# Patient Record
Sex: Male | Born: 1950 | Race: White | Hispanic: No | Marital: Married | Smoking: Never smoker
Health system: Southern US, Community
[De-identification: ages and names within clinical notes are randomized; demographics above are authoritative.]

## PROBLEM LIST (undated history)

## (undated) DIAGNOSIS — R112 Nausea with vomiting, unspecified: Secondary | ICD-10-CM

## (undated) DIAGNOSIS — Z9889 Other specified postprocedural states: Secondary | ICD-10-CM

## (undated) DIAGNOSIS — M1990 Unspecified osteoarthritis, unspecified site: Secondary | ICD-10-CM

## (undated) DIAGNOSIS — L309 Dermatitis, unspecified: Secondary | ICD-10-CM

## (undated) DIAGNOSIS — D3A090 Benign carcinoid tumor of the bronchus and lung: Secondary | ICD-10-CM

## (undated) DIAGNOSIS — Z8489 Family history of other specified conditions: Secondary | ICD-10-CM

## (undated) DIAGNOSIS — G4730 Sleep apnea, unspecified: Secondary | ICD-10-CM

## (undated) DIAGNOSIS — E785 Hyperlipidemia, unspecified: Secondary | ICD-10-CM

## (undated) DIAGNOSIS — K219 Gastro-esophageal reflux disease without esophagitis: Secondary | ICD-10-CM

## (undated) HISTORY — PX: COLONOSCOPY: SHX174

## (undated) HISTORY — PX: EYE SURGERY: SHX253

## (undated) HISTORY — PX: KNEE ARTHROSCOPY: SHX127

## (undated) HISTORY — DX: Sleep apnea, unspecified: G47.30

## (undated) HISTORY — DX: Gastro-esophageal reflux disease without esophagitis: K21.9

## (undated) HISTORY — DX: Dermatitis, unspecified: L30.9

---
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
## 1978-06-25 HISTORY — PX: SEPTOPLASTY: SUR1290

## 2005-11-01 ENCOUNTER — Emergency Department (HOSPITAL_COMMUNITY): Admission: EM | Admit: 2005-11-01 | Discharge: 2005-11-01 | Payer: Self-pay | Admitting: Emergency Medicine

## 2006-08-26 ENCOUNTER — Ambulatory Visit: Payer: Self-pay | Admitting: Internal Medicine

## 2006-09-09 ENCOUNTER — Encounter (INDEPENDENT_AMBULATORY_CARE_PROVIDER_SITE_OTHER): Payer: Self-pay | Admitting: *Deleted

## 2006-09-09 ENCOUNTER — Ambulatory Visit: Payer: Self-pay | Admitting: Internal Medicine

## 2006-09-09 ENCOUNTER — Encounter (INDEPENDENT_AMBULATORY_CARE_PROVIDER_SITE_OTHER): Payer: Self-pay | Admitting: Specialist

## 2008-05-24 DIAGNOSIS — D126 Benign neoplasm of colon, unspecified: Secondary | ICD-10-CM

## 2008-05-24 DIAGNOSIS — K5730 Diverticulosis of large intestine without perforation or abscess without bleeding: Secondary | ICD-10-CM | POA: Insufficient documentation

## 2009-12-20 ENCOUNTER — Emergency Department (HOSPITAL_COMMUNITY): Admission: EM | Admit: 2009-12-20 | Discharge: 2009-12-20 | Payer: Self-pay | Admitting: Emergency Medicine

## 2010-08-08 DIAGNOSIS — L309 Dermatitis, unspecified: Secondary | ICD-10-CM

## 2010-08-08 HISTORY — DX: Dermatitis, unspecified: L30.9

## 2011-07-25 ENCOUNTER — Encounter: Payer: Self-pay | Admitting: Internal Medicine

## 2011-07-27 ENCOUNTER — Encounter: Payer: Self-pay | Admitting: Internal Medicine

## 2011-08-21 ENCOUNTER — Ambulatory Visit (AMBULATORY_SURGERY_CENTER): Payer: 59 | Admitting: *Deleted

## 2011-08-21 VITALS — Ht 71.0 in | Wt 197.1 lb

## 2011-08-21 DIAGNOSIS — Z1211 Encounter for screening for malignant neoplasm of colon: Secondary | ICD-10-CM

## 2011-08-21 MED ORDER — PEG-KCL-NACL-NASULF-NA ASC-C 100 G PO SOLR: ORAL | Status: DC

## 2011-08-22 ENCOUNTER — Encounter: Payer: Self-pay | Admitting: Internal Medicine

## 2011-09-04 ENCOUNTER — Encounter: Payer: Self-pay | Admitting: Internal Medicine

## 2011-09-04 ENCOUNTER — Ambulatory Visit (AMBULATORY_SURGERY_CENTER): Payer: 59 | Admitting: Internal Medicine

## 2011-09-04 VITALS — BP 130/70 | HR 59 | Temp 96.5°F | Resp 18 | Ht 71.0 in | Wt 197.0 lb

## 2011-09-04 DIAGNOSIS — D126 Benign neoplasm of colon, unspecified: Secondary | ICD-10-CM

## 2011-09-04 DIAGNOSIS — Z1211 Encounter for screening for malignant neoplasm of colon: Secondary | ICD-10-CM

## 2011-09-04 DIAGNOSIS — K5730 Diverticulosis of large intestine without perforation or abscess without bleeding: Secondary | ICD-10-CM

## 2011-09-04 DIAGNOSIS — Z86010 Personal history of colonic polyps: Secondary | ICD-10-CM

## 2011-09-04 MED ORDER — SODIUM CHLORIDE 0.9 % IV SOLN: 500.0000 mL | INTRAVENOUS | Status: DC

## 2011-09-04 NOTE — Patient Instructions (Signed)
YOU HAD AN ENDOSCOPIC PROCEDURE TODAY AT THE [facility]: Refer to the procedure report that was given to you for any specific questions about what was found during the examination.  If the procedure report does not answer your questions, please call your gastroenterologist to clarify.  If you requested that your care partner not be given the details of your procedure findings, then the procedure report has been included in a sealed envelope for you to review at your convenience later.  YOU SHOULD EXPECT: Some feelings of bloating in the abdomen. Passage of more gas than usual.  Walking can help get rid of the air that was put into your GI tract during the procedure and reduce the bloating. If you had a lower endoscopy (such as a colonoscopy or flexible sigmoidoscopy) you may notice spotting of blood in your stool or on the toilet paper. If you underwent a bowel prep for your procedure, then you may not have a normal bowel movement for a few days.  DIET: Your first meal following the procedure should be a light meal and then it is ok to progress to your normal diet.  A half-sandwich or bowl of soup is an example of a good first meal.  Heavy or fried foods are harder to digest and may make you feel nauseous or bloated.  Likewise meals heavy in dairy and vegetables can cause extra gas to form and this can also increase the bloating.  Drink plenty of fluids but you should avoid alcoholic beverages for 24 hours.  ACTIVITY: Your care partner should take you home directly after the procedure.  You should plan to take it easy, moving slowly for the rest of the day.  You can resume normal activity the day after the procedure however you should NOT DRIVE or use heavy machinery for 24 hours (because of the sedation medicines used during the test).    SYMPTOMS TO REPORT IMMEDIATELY: A gastroenterologist can be reached at any hour.  During normal business hours, 8:30 AM to 5:00 PM Monday through Friday,  call [phone].  After hours and on weekends, please call the GI answering service at [phone] who will take a message and have the physician on call contact you.   Following lower endoscopy (colonoscopy or flexible sigmoidoscopy):  Excessive amounts of blood in the stool  Significant tenderness or worsening of abdominal pains  Swelling of the abdomen that is new, acute  Fever of 100F or higher    FOLLOW UP: If any biopsies were taken you will be contacted by phone or by letter within the next 1-3 weeks.  Call your gastroenterologist if you have not heard about the biopsies in 3 weeks.  Our staff will call the home number listed on your records the next business day following your procedure to check on you and address any questions or concerns that you may have at that time regarding the information given to you following your procedure. This is a courtesy call and so if there is no answer at the home number and we have not heard from you through the emergency physician on call, we will assume that you have returned to your regular daily activities without incident.  SIGNATURES/CONFIDENTIALITY: You and/or your care partner have signed paperwork which will be entered into your electronic medical record.  These signatures attest to the fact that that the information above on your After Visit Summary has been reviewed and is understood.  Full responsibility of the confidentiality   of this discharge information lies with you and/or your care-partner.     

## 2011-09-04 NOTE — Progress Notes (Signed)
Patient did not experience any of the following events: a burn prior to discharge; a fall within the facility; wrong site/side/patient/procedure/implant event; or a hospital transfer or hospital admission upon discharge from the facility. (G8907) Patient did not have preoperative order for IV antibiotic SSI prophylaxis. (G8918)  

## 2011-09-04 NOTE — Op Note (Signed)
[facility] [address] [place], [place]  [address]  COLONOSCOPY PROCEDURE REPORT  PATIENT:  [name], [name]  MR#:  [id number] BIRTHDATE:  1950-08-29, 61 yrs. old  GENDER:  male ENDOSCOPIST:  Wilhemina Bonito. Eda Keys, MD REF. BY:  Surveillance Program Recall, PROCEDURE DATE:  09/04/2011 PROCEDURE:  Colonoscopy with snare polypectomy x 2 ASA CLASS:  Class II INDICATIONS:  history of pre-cancerous (adenomatous) colon polyps, surveillance and high-risk screening ; index exam 08-2006 w/ TA MEDICATIONS:   MAC sedation, administered by CRNA, propofol (Diprivan) 350 mg IV  DESCRIPTION OF PROCEDURE:   After the risks benefits and alternatives of the procedure were thoroughly explained, informed consent was obtained.  Digital rectal exam was performed and revealed no abnormalities.   The LB 180AL [id number] endoscope was introduced through the anus and advanced to the cecum, which was identified by both the appendix and ileocecal valve, without limitations.  The quality of the prep was excellent, using MoviPrep.  The instrument was then slowly withdrawn as the colon was fully examined. <<PROCEDUREIMAGES>>  FINDINGS:  Two diminutie polyps were found in the descending colon and rectum. Polyps were snared without cautery. Retrieval was successful.  Mild diverticulosis was found in the sigmoid colon. Otherwise normal colonoscopy without other polyps, masses, vascular ectasias, or inflammatory changes.   Retroflexed views in the rectum revealed no abnormalities.    The time to cecum = 3:16 minutes. The scope was then withdrawn in 10:44  minutes from the cecum and the procedure completed.  COMPLICATIONS:  None  ENDOSCOPIC IMPRESSION: 1) Two polyps in the colon - removed 2) Mild diverticulosis in the sigmoid colon 3) Otherwise normal colonoscopy  RECOMMENDATIONS: 1) Follow up colonoscopy in 5 years  ______________________________ Wilhemina Bonito. Eda Keys, MD  CC:  Rodrigo Ran, MD;  The  Patient  n. eSIGNED:   Wilhemina Bonito. Eda Keys at 09/04/2011 08:41 AM  Clydie Braun, [id number]

## 2011-09-05 ENCOUNTER — Telehealth: Payer: Self-pay | Admitting: *Deleted

## 2011-09-05 NOTE — Telephone Encounter (Signed)
  Follow up Call-  Call back number 09/04/2011  Post procedure Call Back phone  # [phone]  Permission to leave phone message Yes     Patient questions:  Do you have a fever, pain , or abdominal swelling? no Pain Score  0 *  Have you tolerated food without any problems? yes  Have you been able to return to your normal activities? yes  Do you have any questions about your discharge instructions: Diet   no Medications  no Follow up visit  no  Do you have questions or concerns about your Care? no  Actions: * If pain score is 4 or above: No action needed, pain <4.

## 2011-09-10 ENCOUNTER — Encounter: Payer: Self-pay | Admitting: Internal Medicine

## 2012-04-24 ENCOUNTER — Encounter: Payer: Self-pay | Admitting: Internal Medicine

## 2014-11-04 ENCOUNTER — Encounter: Payer: Self-pay | Admitting: Internal Medicine

## 2015-10-14 ENCOUNTER — Other Ambulatory Visit: Payer: Self-pay | Admitting: Physician Assistant

## 2016-07-30 ENCOUNTER — Encounter: Payer: Self-pay | Admitting: Internal Medicine

## 2016-08-08 ENCOUNTER — Encounter: Payer: Self-pay | Admitting: Internal Medicine

## 2016-10-18 ENCOUNTER — Encounter: Payer: Self-pay | Admitting: Internal Medicine

## 2016-10-23 ENCOUNTER — Ambulatory Visit (AMBULATORY_SURGERY_CENTER): Payer: Self-pay

## 2016-10-23 VITALS — Ht 70.0 in | Wt 201.2 lb

## 2016-10-23 DIAGNOSIS — Z860100 Personal history of colon polyps, unspecified: Secondary | ICD-10-CM

## 2016-10-23 MED ORDER — SUPREP BOWEL PREP KIT 17.5-3.13-1.6 GM/177ML PO SOLN: 1.0000 | Freq: Once | ORAL | 0 refills | Status: AC

## 2016-10-23 NOTE — Progress Notes (Signed)
No allergies to eggs or soy No past problems with anesthesia No diet meds No home oxygen  Registered emmi 

## 2016-10-29 ENCOUNTER — Telehealth (INDEPENDENT_AMBULATORY_CARE_PROVIDER_SITE_OTHER): Payer: Self-pay | Admitting: Orthopaedic Surgery

## 2016-10-29 NOTE — Telephone Encounter (Signed)
Patient called asked if he can have the gel ordered for an injection in his knee. The number to contact patient is [phone]

## 2016-10-31 NOTE — Telephone Encounter (Signed)
I'm fine with having Synvisc ordered again since it has been well over 6 months since his last injection.

## 2016-11-01 ENCOUNTER — Telehealth (INDEPENDENT_AMBULATORY_CARE_PROVIDER_SITE_OTHER): Payer: Self-pay

## 2016-11-01 NOTE — Telephone Encounter (Signed)
Can we schedule patient with Ninfa Linden or Artis Delay for a Synvisc injection. Next available, no rush, thank you!

## 2016-11-01 NOTE — Telephone Encounter (Signed)
Patient is scheduled for injection on 11/05/16 @ 9:30 am with Artis Delay

## 2016-11-01 NOTE — Telephone Encounter (Signed)
Already done by [facility]. Patient has appointment on Monday with Artis Delay.

## 2016-11-05 ENCOUNTER — Encounter (INDEPENDENT_AMBULATORY_CARE_PROVIDER_SITE_OTHER): Payer: Self-pay

## 2016-11-05 ENCOUNTER — Encounter (INDEPENDENT_AMBULATORY_CARE_PROVIDER_SITE_OTHER): Payer: Self-pay | Admitting: Physician Assistant

## 2016-11-05 ENCOUNTER — Ambulatory Visit (INDEPENDENT_AMBULATORY_CARE_PROVIDER_SITE_OTHER): Payer: 59 | Admitting: Physician Assistant

## 2016-11-05 DIAGNOSIS — M1711 Unilateral primary osteoarthritis, right knee: Secondary | ICD-10-CM | POA: Diagnosis not present

## 2016-11-05 MED ORDER — HYLAN G-F 20 48 MG/6ML IX SOSY
48.0000 mg | PREFILLED_SYRINGE | INTRA_ARTICULAR | Status: AC | PRN
  Administered 2016-11-05: 48 mg via INTRA_ARTICULAR

## 2016-11-05 MED ORDER — LIDOCAINE HCL 1 % IJ SOLN
3.0000 mL | INTRAMUSCULAR | Status: AC | PRN
  Administered 2016-11-05: 3 mL

## 2016-11-05 NOTE — Progress Notes (Signed)
   Procedure Note  Patient: [name]             Date of Birth: [birth year]           MRN: [id number]             Visit Date: 11/05/2016   Examination: Right knee no effusion abnormal warmth erythema full extension and flexion.  Procedures: Visit Diagnoses: Primary osteoarthritis of right knee  Large Joint Inj Date/Time: 11/05/2016 10:30 AM Performed by: Pete Pelt Authorized by: Pete Pelt   Location:  Knee Site:  R knee Needle Size:  18 G Needle Length:  1.5 inches and 3.5 inches Approach:  Anterolateral Ultrasound Guidance: No   Fluoroscopic Guidance: No   Arthrogram: No   Medications:  3 mL lidocaine 1 %; 48 mg Hylan 48 MG/6ML Aspiration Attempted: No   Patient tolerance:  Patient tolerated the procedure well with no immediate complications   Plan: Follow-up on an as-needed basis. He understands he can have supplemental injections every 6 months as needed.

## 2016-11-06 ENCOUNTER — Encounter: Payer: Self-pay | Admitting: Internal Medicine

## 2016-11-06 ENCOUNTER — Ambulatory Visit (AMBULATORY_SURGERY_CENTER): Payer: 59 | Admitting: Internal Medicine

## 2016-11-06 VITALS — BP 116/72 | HR 66 | Temp 97.5°F | Resp 16 | Ht 70.0 in | Wt 201.0 lb

## 2016-11-06 DIAGNOSIS — Z86010 Personal history of colonic polyps: Secondary | ICD-10-CM

## 2016-11-06 DIAGNOSIS — D120 Benign neoplasm of cecum: Secondary | ICD-10-CM | POA: Diagnosis not present

## 2016-11-06 DIAGNOSIS — D124 Benign neoplasm of descending colon: Secondary | ICD-10-CM

## 2016-11-06 MED ORDER — SODIUM CHLORIDE 0.9 % IV SOLN: 500.0000 mL | INTRAVENOUS | Status: DC

## 2016-11-06 NOTE — Op Note (Signed)
[facility] Patient Name: [name] Procedure Date: 11/06/2016 8:08 AM MRN: [id number] Endoscopist: Docia Chuck. Henrene Pastor , MD Age: 66 Referring MD:  Date of Birth: 07/08/1950 Gender: Male Account #: [id number] Procedure:                Colonoscopy with cold snare polypectomy x 2 Indications:              High risk colon cancer surveillance: Personal                            history of multiple (3 or more) adenomas. Prior                            exams 2008, 2013 Medicines:                Monitored Anesthesia Care Procedure:                Pre-Anesthesia Assessment:                           - Prior to the procedure, a History and Physical                            was performed, and patient medications and                            allergies were reviewed. The patient's tolerance of                            previous anesthesia was also reviewed. The risks                            and benefits of the procedure and the sedation                            options and risks were discussed with the patient.                            All questions were answered, and informed consent                            was obtained. Prior Anticoagulants: The patient has                            taken no previous anticoagulant or antiplatelet                            agents. ASA Grade Assessment: II - A patient with                            mild systemic disease. After reviewing the risks                            and benefits, the patient was deemed in  satisfactory condition to undergo the procedure.                           After obtaining informed consent, the colonoscope                            was passed under direct vision. Throughout the                            procedure, the patient's blood pressure, pulse, and                            oxygen saturations were monitored continuously. The                            Colonoscope was  introduced through the anus and                            advanced to the the cecum, identified by                            appendiceal orifice and ileocecal valve. The                            ileocecal valve, appendiceal orifice, and rectum                            were photographed. The quality of the bowel                            preparation was excellent. The colonoscopy was                            performed without difficulty. The patient tolerated                            the procedure well. The bowel preparation used was                            SUPREP. Scope In: 8:12:50 AM Scope Out: 8:25:37 AM Scope Withdrawal Time: 0 hours 10 minutes 16 seconds  Total Procedure Duration: 0 hours 12 minutes 47 seconds  Findings:                 Two polyps were found in the descending colon and                            cecum. The polyps were 2 to 3 mm in size. These                            polyps were removed with a cold snare. Resection                            and retrieval were complete.  Multiple diverticula were found in the left colon.                           Internal hemorrhoids were found during                            retroflexion. The hemorrhoids were small.                           The exam was otherwise without abnormality on                            direct and retroflexion views. Complications:            No immediate complications. Estimated blood loss:                            None. Estimated Blood Loss:     Estimated blood loss: none. Impression:               - Two 2 to 3 mm polyps in the descending colon and                            in the cecum, removed with a cold snare. Resected                            and retrieved.                           - Diverticulosis in the left colon.                           - Internal hemorrhoids.                           - The examination was otherwise normal on direct                             and retroflexion views. Recommendation:           - Repeat colonoscopy in 5 years for surveillance.                           - Patient has a contact number available for                            emergencies. The signs and symptoms of potential                            delayed complications were discussed with the                            patient. Return to normal activities tomorrow.                            Written discharge instructions were provided to the  patient.                           - Resume previous diet.                           - Continue present medications.                           - Await pathology results. Docia Chuck. Henrene Pastor, MD 11/06/2016 8:31:14 AM This report has been signed electronically.

## 2016-11-06 NOTE — Progress Notes (Signed)
To recovery, report to Hodges, RN, VSS 

## 2016-11-06 NOTE — Progress Notes (Signed)
Called to room to assist during endoscopic procedure.  Patient ID and intended procedure confirmed with present staff. Received instructions for my participation in the procedure from the performing physician.  

## 2016-11-06 NOTE — Patient Instructions (Signed)
YOU HAD AN ENDOSCOPIC PROCEDURE TODAY AT THE [facility]:   Refer to the procedure report that was given to you for any specific questions about what was found during the examination.  If the procedure report does not answer your questions, please call your gastroenterologist to clarify.  If you requested that your care partner not be given the details of your procedure findings, then the procedure report has been included in a sealed envelope for you to review at your convenience later.  YOU SHOULD EXPECT: Some feelings of bloating in the abdomen. Passage of more gas than usual.  Walking can help get rid of the air that was put into your GI tract during the procedure and reduce the bloating. If you had a lower endoscopy (such as a colonoscopy or flexible sigmoidoscopy) you may notice spotting of blood in your stool or on the toilet paper. If you underwent a bowel prep for your procedure, you may not have a normal bowel movement for a few days.  Please Note:  You might notice some irritation and congestion in your nose or some drainage.  This is from the oxygen used during your procedure.  There is no need for concern and it should clear up in a day or so.  SYMPTOMS TO REPORT IMMEDIATELY:   Following lower endoscopy (colonoscopy or flexible sigmoidoscopy):  Excessive amounts of blood in the stool  Significant tenderness or worsening of abdominal pains  Swelling of the abdomen that is new, acute  Fever of 100F or higher   For urgent or emergent issues, a gastroenterologist can be reached at any hour by calling [phone].   DIET:  We do recommend a small meal at first, but then you may proceed to your regular diet.  Drink plenty of fluids but you should avoid alcoholic beverages for 24 hours. Try to increase the fiber in your diet, and drink plenty of water.  ACTIVITY:  You should plan to take it easy for the rest of today and you should NOT DRIVE or use heavy machinery until  tomorrow (because of the sedation medicines used during the test).    FOLLOW UP: Our staff will call the number listed on your records the next business day following your procedure to check on you and address any questions or concerns that you may have regarding the information given to you following your procedure. If we do not reach you, we will leave a message.  However, if you are feeling well and you are not experiencing any problems, there is no need to return our call.  We will assume that you have returned to your regular daily activities without incident.  If any biopsies were taken you will be contacted by phone or by letter within the next 1-3 weeks.  Please call us at [phone] if you have not heard about the biopsies in 3 weeks.    SIGNATURES/CONFIDENTIALITY: You and/or your care partner have signed paperwork which will be entered into your electronic medical record.  These signatures attest to the fact that that the information above on your After Visit Summary has been reviewed and is understood.  Full responsibility of the confidentiality of this discharge information lies with you and/or your care-partner.  Read all of the handouts given to you by your recovery room nurse. 

## 2016-11-07 ENCOUNTER — Telehealth: Payer: Self-pay | Admitting: *Deleted

## 2016-11-07 NOTE — Telephone Encounter (Signed)
  Follow up Call-  Call back number 11/06/2016  Post procedure Call Back phone  # [phone]  Permission to leave phone message Yes  Some recent data might be hidden     Patient questions:  Do you have a fever, pain , or abdominal swelling? No. Pain Score  0 *  Have you tolerated food without any problems? Yes.    Have you been able to return to your normal activities? Yes.    Do you have any questions about your discharge instructions: Diet   No. Medications  No. Follow up visit  No.  Do you have questions or concerns about your Care? No.  Actions: * If pain score is 4 or above: No action needed, pain <4.

## 2016-11-13 ENCOUNTER — Encounter: Payer: Self-pay | Admitting: Internal Medicine

## 2017-12-03 ENCOUNTER — Telehealth (INDEPENDENT_AMBULATORY_CARE_PROVIDER_SITE_OTHER): Payer: Self-pay | Admitting: Orthopaedic Surgery

## 2017-12-03 NOTE — Telephone Encounter (Signed)
Please advise 

## 2017-12-03 NOTE — Telephone Encounter (Signed)
Needs follow up for knee has not been seen since 2018

## 2017-12-03 NOTE — Telephone Encounter (Signed)
Patient called asked if he can be set up for an MRI on his right knee. Patient said the knee is beginning to hurt worse. The number to contact patient is [phone]

## 2017-12-03 NOTE — Telephone Encounter (Signed)
Needs appt before something like this can be ordered please

## 2017-12-04 NOTE — Telephone Encounter (Signed)
Patient scheduled 12/16/17 at 9:15am with Artis Delay

## 2017-12-16 ENCOUNTER — Ambulatory Visit (INDEPENDENT_AMBULATORY_CARE_PROVIDER_SITE_OTHER): Payer: Medicare Other | Admitting: Physician Assistant

## 2017-12-16 ENCOUNTER — Encounter (INDEPENDENT_AMBULATORY_CARE_PROVIDER_SITE_OTHER): Payer: Self-pay | Admitting: Physician Assistant

## 2017-12-16 ENCOUNTER — Ambulatory Visit (INDEPENDENT_AMBULATORY_CARE_PROVIDER_SITE_OTHER): Payer: Medicare Other

## 2017-12-16 VITALS — Ht 70.0 in | Wt 201.0 lb

## 2017-12-16 DIAGNOSIS — M25561 Pain in right knee: Secondary | ICD-10-CM | POA: Diagnosis not present

## 2017-12-16 DIAGNOSIS — G8929 Other chronic pain: Secondary | ICD-10-CM

## 2017-12-16 MED ORDER — DIAZEPAM 5 MG PO TABS: ORAL_TABLET | ORAL | 0 refills | Status: DC

## 2017-12-16 NOTE — Progress Notes (Signed)
Office Visit Note   Patient: [name]           Date of Birth: [birth year]           MRN: [id number] Visit Date: 12/16/2017              Requested by: [name], MD [address] [place],  [address] PCP: [name], MD   Assessment & Plan: Visit Diagnoses:  1. Chronic pain of right knee     Plan: Due to the fact the patient had similar findings back in 2014 and had loose bodies that did not show up on regular radiographs subsequently underwent a right knee arthroscopy for removal of loose bodies and meniscal tear recommend MRI to rule out loose bodies.  Having back after the MRI to go over results and discuss further treatment.  He is claustrophobic therefore we did send its value that he will take prior to the study.  Follow-Up Instructions: Return for After MRI.   Orders:  Orders Placed This Encounter  Procedures  . XR Knee 1-2 Views Right  . MR Knee Right w/o contrast   Meds ordered this encounter  Medications  . diazepam (VALIUM) 5 MG tablet    Sig: Take 1 tab 30 mins prior to procedure repeat as needed.    Dispense:  2 tablet    Refill:  0      Procedures: No procedures performed   Clinical Data: No additional findings.   Subjective: Chief Complaint  Patient presents with  . Right Knee - Follow-up    S/p cortisone  injection 11/05/16    HPI Mr. Baldyga comes in today with right knee pain that is becoming worse over the last few months.  He states that he went back to the gym and was doing some running in the gym worsening pain since then.  He notes that knee occasionally gets walking he has to actively push the need to get it unlocked.  He has had no giving way catching or painful popping in the knee.  Notes no swelling.  He is tried no medications or treatment for this.  Had similar symptoms back in 2014 underwent knee arthroscopy for loose bodies but did not show up on plain films and a meniscal tear.  His symptoms are very similar to what  he had in the past. Review of Systems Please see HPI otherwise negative  Objective: Vital Signs: Ht 5\' 10"  (1.778 m)   Wt 201 lb (91.2 kg)   BMI 28.84 kg/m   Physical Exam  Constitutional: He is oriented to person, place, and time. He appears well-developed and well-nourished. No distress.  Pulmonary/Chest: Effort normal.  Neurological: He is alert and oriented to person, place, and time.  Skin: He is not diaphoretic.  Psychiatric: He has a normal mood and affect.    Ortho Exam Bilateral knees he has good range of motion without pain.  He has tenderness over the lateral joint line of the right knee.  Positive McMurray's right knee.  No abnormal warmth erythema or effusion positive edema of the right knee compared to left though.  Calves are supple nontender bilaterally.  No instability valgus varus stressing anterior drawer is negative bilaterally. Specialty Comments:  No specialty comments available.  Imaging: Xr Knee 1-2 Views Right  Result Date: 12/16/2017 Right knee AP lateral views: Moderate narrowing lateral joint line.  Mild to moderate patellofemoral changes.  No acute fractures bony abnormalities or loose bodies.  Knee is  well located.    PMFS History: Patient Active Problem List   Diagnosis Date Noted  . Primary osteoarthritis of right knee 11/05/2016  . COLONIC POLYPS, ADENOMATOUS 05/24/2008  . DIVERTICULOSIS, COLON 05/24/2008   Past Medical History:  Diagnosis Date  . GERD (gastroesophageal reflux disease)    weight loss resolved gerd  . Sleep apnea    cpap    Family History  Problem Relation Age of Onset  . Colon cancer Neg Hx   . Stomach cancer Neg Hx     Past Surgical History:  Procedure Laterality Date  . SEPTOPLASTY  1980   x 2  . TONSILLECTOMY AND ADENOIDECTOMY  1960   Social History   Occupational History  . Not on file  Tobacco Use  . Smoking status: Never Smoker  . Smokeless tobacco: Never Used  Substance and Sexual Activity  .  Alcohol use: Yes    Alcohol/week: 4.2 oz    Types: 7 Glasses of wine per week  . Drug use: No  . Sexual activity: Not on file

## 2017-12-25 ENCOUNTER — Ambulatory Visit
Admission: RE | Admit: 2017-12-25 | Discharge: 2017-12-25 | Disposition: A | Discharge status: Home / Self Care | Payer: Medicare Other | Source: Ambulatory Visit | Attending: Physician Assistant | Admitting: Physician Assistant

## 2017-12-25 DIAGNOSIS — G8929 Other chronic pain: Secondary | ICD-10-CM

## 2017-12-25 DIAGNOSIS — M25561 Pain in right knee: Principal | ICD-10-CM

## 2017-12-30 ENCOUNTER — Ambulatory Visit (INDEPENDENT_AMBULATORY_CARE_PROVIDER_SITE_OTHER): Payer: Medicare Other | Admitting: Physician Assistant

## 2017-12-30 ENCOUNTER — Encounter (INDEPENDENT_AMBULATORY_CARE_PROVIDER_SITE_OTHER): Payer: Self-pay | Admitting: Physician Assistant

## 2017-12-30 ENCOUNTER — Telehealth (INDEPENDENT_AMBULATORY_CARE_PROVIDER_SITE_OTHER): Payer: Self-pay

## 2017-12-30 DIAGNOSIS — M1711 Unilateral primary osteoarthritis, right knee: Secondary | ICD-10-CM | POA: Diagnosis not present

## 2017-12-30 NOTE — Telephone Encounter (Signed)
Monovisc right knee

## 2017-12-30 NOTE — Progress Notes (Signed)
HPI: [name] returns today to go over the MRI of his right knee.  He has had no real change pain in his right knee since the last office visit.  Continues to have pain mostly the lateral aspect of the knee.  Cut back on his exercise and actually feels that he has gained on the lateral joint line.. At least 5 pounds since then.  Does have a history of a right knee arthroscopy in 2014 for lateral meniscal tear and loose body.  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  MRI MRI images are reviewed with the patient today and show severe lateral compartmental arthritis which progressed since prior MRI.  Minimal degenerative changes were noted medial and patellofemoral compartment.  No acute meniscal tear.  Physical exam: Right knee slight tenderness.  Full extension flexion beyond 90 degrees.  Impression: Right knee osteoarthritis mainly involving the lateral compartment  Plan: At this point time patient would like to proceed with a repeat Monovisc injection which he had one in May 2018 and did well until recently.  Call him once the Monovisc injection is available.  Questions were encouraged and answered at length.

## 2017-12-31 NOTE — Telephone Encounter (Signed)
Noted  

## 2018-01-01 ENCOUNTER — Telehealth (INDEPENDENT_AMBULATORY_CARE_PROVIDER_SITE_OTHER): Payer: Self-pay

## 2018-01-01 NOTE — Telephone Encounter (Signed)
Submitted application online for Monovisc, right knee. 

## 2018-01-15 NOTE — Telephone Encounter (Signed)
Patient would like a call back concerning status of Monovisc right knee. Patients # [phone]

## 2018-01-16 ENCOUNTER — Telehealth (INDEPENDENT_AMBULATORY_CARE_PROVIDER_SITE_OTHER): Payer: Self-pay | Admitting: Orthopaedic Surgery

## 2018-01-16 NOTE — Telephone Encounter (Signed)
Patient call asked for a call back concerning the monovisc gel injection.The number to contact patient is [phone]

## 2018-01-16 NOTE — Telephone Encounter (Signed)
Talked with patient concerning gel injection.  

## 2018-01-16 NOTE — Telephone Encounter (Signed)
Talked with patient and advised him that a PA is required for Monovisc injection.  Currently pending authorization.  Will call patient once this has been approved through his insurance.

## 2018-01-17 ENCOUNTER — Telehealth (INDEPENDENT_AMBULATORY_CARE_PROVIDER_SITE_OTHER): Payer: Self-pay

## 2018-01-17 NOTE — Telephone Encounter (Signed)
Initiated PA through [facility].  Currently pending per Katrina D. Pending PA# [id number]

## 2018-01-21 ENCOUNTER — Telehealth (INDEPENDENT_AMBULATORY_CARE_PROVIDER_SITE_OTHER): Payer: Self-pay | Admitting: Physician Assistant

## 2018-01-21 NOTE — Telephone Encounter (Signed)
Patient called asked for a call back concerning his right knee. The number to contact patient is [phone] or [phone] Home

## 2018-01-23 NOTE — Telephone Encounter (Signed)
LMOM for patient that I was returning his call and he can call back if he still needs [place] still and leave [place] a message

## 2018-01-24 ENCOUNTER — Telehealth (INDEPENDENT_AMBULATORY_CARE_PROVIDER_SITE_OTHER): Payer: Self-pay

## 2018-01-24 NOTE — Telephone Encounter (Signed)
PA approved for Monovisc, right knee through [facility] per Ram C.at [facility]. [place] No Co-pay PA Approval# [id number] Reference# [id number]

## 2018-02-03 ENCOUNTER — Ambulatory Visit (INDEPENDENT_AMBULATORY_CARE_PROVIDER_SITE_OTHER): Payer: Medicare Other | Admitting: Physician Assistant

## 2018-02-03 ENCOUNTER — Encounter (INDEPENDENT_AMBULATORY_CARE_PROVIDER_SITE_OTHER): Payer: Self-pay | Admitting: Physician Assistant

## 2018-02-03 DIAGNOSIS — M1711 Unilateral primary osteoarthritis, right knee: Secondary | ICD-10-CM | POA: Diagnosis not present

## 2018-02-03 MED ORDER — HYALURONAN 88 MG/4ML IX SOSY
88.0000 mg | PREFILLED_SYRINGE | INTRA_ARTICULAR | Status: AC | PRN
  Administered 2018-02-03: 88 mg via INTRA_ARTICULAR

## 2018-02-03 NOTE — Progress Notes (Signed)
   Procedure Note  Patient: [name]             Date of Birth: [birth year]           MRN: [id number]             Visit Date: 02/03/2018 HPI: [name] returns today for his right knee pain.  He denies any mechanical symptoms.  He has had no new injury to the knee.  He has known osteoarthritis of the right knee.  Physical exam: Right knee no effusion abnormal warmth erythema.  Good range of motion of the knee. Procedures: Visit Diagnoses: Primary osteoarthritis of right knee  Large Joint Inj: R knee on 02/03/2018 9:08 AM Indications: pain Details: 22 G 1.5 in needle, superolateral approach  Arthrogram: No  Medications: 88 mg Hyaluronan 88 MG/4ML Outcome: tolerated well, no immediate complications Procedure, treatment alternatives, risks and benefits explained, specific risks discussed. Consent was given by the patient. Immediately prior to procedure a time out was called to verify the correct patient, procedure, equipment, support staff and site/side marked as required. Patient was prepped and draped in the usual sterile fashion.     Plan: He will follow-up on an as-needed basis.  He understands that he can have cortisone injections every 3 months amount of his injections every 6 months.  He will continue work on quad strengthening the knee.  Questions encouraged and answered.

## 2018-02-26 ENCOUNTER — Encounter (INDEPENDENT_AMBULATORY_CARE_PROVIDER_SITE_OTHER): Payer: Self-pay | Admitting: Physician Assistant

## 2018-02-26 ENCOUNTER — Ambulatory Visit (INDEPENDENT_AMBULATORY_CARE_PROVIDER_SITE_OTHER): Payer: Medicare Other | Admitting: Physician Assistant

## 2018-02-26 DIAGNOSIS — M1711 Unilateral primary osteoarthritis, right knee: Secondary | ICD-10-CM

## 2018-02-26 NOTE — Progress Notes (Signed)
HPI: Mr. Pacer returns today right knee pain.  He is status post Monovisc injection right knee on 02/03/2018.  He states overall the joint of the knee feels better when he is having pain in the lateral aspect of the right knee.  States the knee feels very tight.  He has pain whenever he crosses his legs.  Taking no medications for this.  He is not using a brace on the right knee.  Does use ice on the knee occasionally.  Review of systems please see HPI otherwise negative.  Physical exam: Right knee full extension flexion beyond 90 degrees.  Tenderness over the lateral joint line only.  Slight effusion no abnormal warmth no erythema no ecchymosis.  Right calf supple nontender.  No instability valgus varus stressing.  Impression: Right knee osteoarthritis lateral compartment  Plan: Spoke with patient about the fact that a supplemental injection in the knee typically does not begin to work effectively until 4 to 6 weeks status post injection.  Also I would not recommend aspirating the knee at this point time due to the recent supplemental injection.  Dr. Ninfa Linden also spoke with the patient and examined the patient if he continues to have pain in the next 3 weeks consider repeat knee arthroscopy.  Again he had a knee arthroscopy in 2014 which consisted of removal of loose bodies that were not seen on plain radiographs and a meniscal tear lateral meniscus.  Patient states that his knee feels very similar to what he had in 2014 prior to having knee arthroscopy.  He has had a recent MRI that did not show an acute meniscus tear only postoperative changes.  Also no loose bodies were seen on the MRI.  Questions are encouraged and answered at length

## 2018-04-09 ENCOUNTER — Ambulatory Visit (INDEPENDENT_AMBULATORY_CARE_PROVIDER_SITE_OTHER): Payer: Medicare Other | Admitting: Orthopaedic Surgery

## 2018-04-09 ENCOUNTER — Encounter (INDEPENDENT_AMBULATORY_CARE_PROVIDER_SITE_OTHER): Payer: Self-pay | Admitting: Orthopaedic Surgery

## 2018-04-09 DIAGNOSIS — M1611 Unilateral primary osteoarthritis, right hip: Secondary | ICD-10-CM | POA: Diagnosis not present

## 2018-04-09 NOTE — Progress Notes (Signed)
HPI: [name] returns today follow-up of his right knee status post Monovisc injection right knee 02/03/2018.  States overall the knee feels better.  Still having some discomfort in the lateral aspect of the knee.  He notes that he has had very rare but a couple of occasions where the knee felt like it buckled on even ground.  He is walking 1 mile 2-3 times a week.  Has some catching sensation in the bed only this is been rare.  He has known arthritis of the right knee with moderately severe lateral compartmental arthritic changes.  Only minimal degenerative changes patellofemoral medial compartments.  Physical exam: Right knee has full extension full flexion no instability valgus varus stressing.  No abnormal warmth no erythema minimal tenderness on lateral joint line.  Impression: Right knee osteoarthritis  Plan: He will work on strengthening of the knee.  We will have him back in late February 2020 repeat supplemental injection.  Questions were encouraged and answered at length.  He can always follow-up sooner if there is any questions or concerns.  He may benefit from a cortisone injection of the knee in the interim if his pain increases.

## 2018-06-02 ENCOUNTER — Telehealth (INDEPENDENT_AMBULATORY_CARE_PROVIDER_SITE_OTHER): Payer: Self-pay

## 2018-06-02 NOTE — Telephone Encounter (Signed)
Received fax from [facility] stating that authorization is approved for Monovisc Injection. Valid 06/25/2018- 06/25/2019 Reference# [id number]

## 2018-06-25 DIAGNOSIS — C801 Malignant (primary) neoplasm, unspecified: Secondary | ICD-10-CM

## 2018-06-25 HISTORY — DX: Malignant (primary) neoplasm, unspecified: C80.1

## 2018-08-04 ENCOUNTER — Telehealth (INDEPENDENT_AMBULATORY_CARE_PROVIDER_SITE_OTHER): Payer: Self-pay | Admitting: Orthopaedic Surgery

## 2018-08-04 ENCOUNTER — Telehealth (INDEPENDENT_AMBULATORY_CARE_PROVIDER_SITE_OTHER): Payer: Self-pay

## 2018-08-04 NOTE — Telephone Encounter (Signed)
Talked with patient concerning gel injections. 

## 2018-08-04 NOTE — Telephone Encounter (Signed)
April,  This patient called wanted to know if you ordered his gel injection?  He was waiting to hear something. He is requesting that you call him   [phone]

## 2018-08-04 NOTE — Telephone Encounter (Signed)
Submitted VOB for SynviscOne, right knee. 

## 2018-08-06 ENCOUNTER — Telehealth (INDEPENDENT_AMBULATORY_CARE_PROVIDER_SITE_OTHER): Payer: Self-pay

## 2018-08-06 ENCOUNTER — Telehealth (INDEPENDENT_AMBULATORY_CARE_PROVIDER_SITE_OTHER): Payer: Self-pay | Admitting: Orthopaedic Surgery

## 2018-08-06 NOTE — Telephone Encounter (Signed)
Called and left VM advising to call back to schedule an appointment with Dr. Ninfa Linden for gel injection.  Approved for SynviscOne, right knee. Buy & Bill Covered at 100% after the Deductible has been met. No Co-pay No PA required

## 2018-08-06 NOTE — Telephone Encounter (Signed)
Tried returning patient's call, but no answer.

## 2018-08-06 NOTE — Telephone Encounter (Signed)
PT called returning a call from April Jackson.

## 2018-08-07 ENCOUNTER — Telehealth (INDEPENDENT_AMBULATORY_CARE_PROVIDER_SITE_OTHER): Payer: Self-pay | Admitting: Orthopaedic Surgery

## 2018-08-07 NOTE — Telephone Encounter (Signed)
Talked with patient concerning appointment.

## 2018-08-07 NOTE — Telephone Encounter (Signed)
Patient called stating calling you back to schedule injections. Please call asap.  [phone]

## 2018-08-20 ENCOUNTER — Encounter (INDEPENDENT_AMBULATORY_CARE_PROVIDER_SITE_OTHER): Payer: Self-pay | Admitting: Orthopaedic Surgery

## 2018-08-20 ENCOUNTER — Ambulatory Visit (INDEPENDENT_AMBULATORY_CARE_PROVIDER_SITE_OTHER): Payer: Medicare Other | Admitting: Orthopaedic Surgery

## 2018-08-20 DIAGNOSIS — M1711 Unilateral primary osteoarthritis, right knee: Secondary | ICD-10-CM | POA: Diagnosis not present

## 2018-08-20 MED ORDER — HYLAN G-F 20 48 MG/6ML IX SOSY
48.0000 mg | PREFILLED_SYRINGE | INTRA_ARTICULAR | Status: AC | PRN
  Administered 2018-08-20: 48 mg via INTRA_ARTICULAR

## 2018-08-20 NOTE — Progress Notes (Signed)
   Procedure Note  Patient: [name]             Date of Birth: [birth year]           MRN: [id number]             Visit Date: 08/20/2018  Procedures: Visit Diagnoses: Primary osteoarthritis of right knee  Large Joint Inj: R knee on 08/20/2018 5:13 PM Indications: pain and diagnostic evaluation Details: 22 G 1.5 in needle, superolateral approach  Arthrogram: No  Medications: 48 mg Hylan 48 MG/6ML Outcome: tolerated well, no immediate complications Procedure, treatment alternatives, risks and benefits explained, specific risks discussed. Consent was given by the patient. Immediately prior to procedure a time out was called to verify the correct patient, procedure, equipment, support staff and site/side marked as required. Patient was prepped and draped in the usual sterile fashion.    Patient is well-known to me.  He is scheduled today for a hyaluronic acid injection with Synvisc 1 into the right knee to treat the pain from moderate osteoarthritis.  He has had similar injections before.  He is still very active.  His arthritis in his knee mainly involves the lateral compartment but some medial as well.  On exam today there is no knee joint effusion.  He does have some global pain but overall looks good with good range of motion.  I placed Synvisc 1 in his right knee without difficulty.  He knows he can have this again in 6 months if needed.  All question concerns were answered and addressed.  Follow-up otherwise be as needed.

## 2018-10-21 ENCOUNTER — Other Ambulatory Visit: Payer: Self-pay | Admitting: Internal Medicine

## 2018-10-21 DIAGNOSIS — E785 Hyperlipidemia, unspecified: Secondary | ICD-10-CM

## 2018-12-01 ENCOUNTER — Inpatient Hospital Stay: Admission: RE | Admit: 2018-12-01 | Payer: Medicare Other | Source: Ambulatory Visit

## 2018-12-10 DIAGNOSIS — H6123 Impacted cerumen, bilateral: Secondary | ICD-10-CM | POA: Insufficient documentation

## 2018-12-16 ENCOUNTER — Ambulatory Visit
Admission: RE | Admit: 2018-12-16 | Discharge: 2018-12-16 | Disposition: A | Discharge status: Home / Self Care | Payer: Medicare Other | Source: Ambulatory Visit | Attending: Internal Medicine | Admitting: Internal Medicine

## 2018-12-16 DIAGNOSIS — E785 Hyperlipidemia, unspecified: Secondary | ICD-10-CM

## 2018-12-22 ENCOUNTER — Other Ambulatory Visit: Payer: Self-pay | Admitting: Internal Medicine

## 2018-12-22 DIAGNOSIS — R918 Other nonspecific abnormal finding of lung field: Secondary | ICD-10-CM

## 2018-12-23 ENCOUNTER — Ambulatory Visit
Admission: RE | Admit: 2018-12-23 | Discharge: 2018-12-23 | Disposition: A | Discharge status: Home / Self Care | Payer: Medicare Other | Source: Ambulatory Visit | Attending: Internal Medicine | Admitting: Internal Medicine

## 2018-12-23 DIAGNOSIS — R918 Other nonspecific abnormal finding of lung field: Secondary | ICD-10-CM

## 2018-12-23 MED ORDER — IOPAMIDOL (ISOVUE-300) INJECTION 61%
75.0000 mL | Freq: Once | INTRAVENOUS | Status: AC | PRN
  Administered 2018-12-23: 75 mL via INTRAVENOUS

## 2018-12-24 ENCOUNTER — Other Ambulatory Visit (HOSPITAL_COMMUNITY): Payer: Self-pay | Admitting: Endocrinology

## 2018-12-24 ENCOUNTER — Other Ambulatory Visit: Payer: Self-pay | Admitting: Endocrinology

## 2018-12-24 DIAGNOSIS — R918 Other nonspecific abnormal finding of lung field: Secondary | ICD-10-CM

## 2018-12-25 ENCOUNTER — Other Ambulatory Visit: Payer: Self-pay

## 2018-12-25 ENCOUNTER — Ambulatory Visit (HOSPITAL_COMMUNITY)
Admission: RE | Admit: 2018-12-25 | Discharge: 2018-12-25 | Disposition: A | Discharge status: Home / Self Care | Payer: Medicare Other | Source: Ambulatory Visit | Attending: Endocrinology | Admitting: Endocrinology

## 2018-12-25 DIAGNOSIS — I2510 Atherosclerotic heart disease of native coronary artery without angina pectoris: Secondary | ICD-10-CM | POA: Insufficient documentation

## 2018-12-25 DIAGNOSIS — I700 Atherosclerosis of aorta: Secondary | ICD-10-CM | POA: Insufficient documentation

## 2018-12-25 DIAGNOSIS — R918 Other nonspecific abnormal finding of lung field: Secondary | ICD-10-CM

## 2018-12-25 LAB — GLUCOSE, CAPILLARY: Glucose-Capillary: 97 mg/dL (ref 70–99)

## 2018-12-25 MED ORDER — FLUDEOXYGLUCOSE F - 18 (FDG) INJECTION
10.9100 | Freq: Once | INTRAVENOUS | Status: AC
  Administered 2018-12-25: 12:00:00 10.91 via INTRAVENOUS

## 2018-12-29 ENCOUNTER — Telehealth: Payer: Self-pay | Admitting: *Deleted

## 2018-12-29 NOTE — Telephone Encounter (Signed)
Oncology Nurse Navigator Documentation  Oncology Nurse Navigator Flowsheets 12/29/2018  Navigator Location [facility]  Referral Date to RadOnc/MedOnc 12/29/2018  Navigator Encounter Type Telephone/I received a referral today from Dr. Silvestre Mesi office.  I called patient with an appt for [place] next week.  He verbalized understanding of appt time and place.   Telephone Outgoing Call  Abnormal Finding Date 12/16/2018  Patient Visit Type Other  Treatment Phase Abnormal Scans  Barriers/Navigation Needs Coordination of Care;Education  Education Other  Interventions Coordination of Care;Education  Coordination of Care Appts  Education Method Verbal  Acuity Level 2  Time Spent with Patient 30

## 2019-01-05 ENCOUNTER — Encounter: Payer: Self-pay | Admitting: *Deleted

## 2019-01-08 ENCOUNTER — Other Ambulatory Visit: Payer: Self-pay

## 2019-01-08 ENCOUNTER — Other Ambulatory Visit: Payer: Self-pay | Admitting: *Deleted

## 2019-01-08 ENCOUNTER — Encounter: Payer: Self-pay | Admitting: Cardiothoracic Surgery

## 2019-01-08 ENCOUNTER — Institutional Professional Consult (permissible substitution) (INDEPENDENT_AMBULATORY_CARE_PROVIDER_SITE_OTHER): Payer: Self-pay | Admitting: Cardiothoracic Surgery

## 2019-01-08 VITALS — BP 132/75 | HR 76 | Temp 100.1°F | Resp 18 | Wt 200.8 lb

## 2019-01-08 DIAGNOSIS — Z01811 Encounter for preprocedural respiratory examination: Secondary | ICD-10-CM

## 2019-01-08 DIAGNOSIS — R918 Other nonspecific abnormal finding of lung field: Secondary | ICD-10-CM

## 2019-01-08 DIAGNOSIS — R911 Solitary pulmonary nodule: Secondary | ICD-10-CM

## 2019-01-08 NOTE — Progress Notes (Signed)
The proposed treatment discussed in cancer conference 01/08/2019 is for discussion purpose only and is not a binding recommendation.  The patient was not physically examined nor present for his treatment options.  Therefore, final treatment plans cannot be decided.

## 2019-01-08 NOTE — Progress Notes (Signed)
[name]       [place] [address]             [phone]                    [name] [place] Medical Record #[id number] Date of Birth: January 25, 1951  Referring: [name], MD Primary Care: [name], MD Primary Cardiologist: No primary care provider on file.  Chief Complaint:    Chief Complaint  Patient presents with   Lung Lesion    History of Present Illness:    [name] 68 y.o. male was not seen in the office  Today.  I was to see him , he had been placed in the exam room.  On his previsit screening it was noted that his temperature was 100.1, he was placed in exam room to be seen anyway.  From a distance it was discussed with the patient that he had no infectious symptoms. .    The patient was referred for the incidental finding of a small lung nodule in the right lung.  This was discovered on a CT done for calcium scoring found incidentally, subsequently a 6 full CT of the chest was done and a PET scan he has not had pulmonary function studies done.  He is a lifelong non-smoker   Current Activity/ Functional Status:  Patient is independent with mobility/ambulation, transfers, ADL's, IADL's.   Zubrod Score: At the time of surgery this patients most appropriate activity status/level should be described as: [x]     0    Normal activity, no symptoms []     1    Restricted in physical strenuous activity but ambulatory, able to do out light work []     2    Ambulatory and capable of self care, unable to do work activities, up and about               >50 % of waking hours                              []     3    Only limited self care, in bed greater than 50% of waking hours []     4    Completely disabled, no self care, confined to bed or chair []     5    Moribund   Past Medical History:  Diagnosis Date   Eczema 08/08/2010   GERD (gastroesophageal reflux disease)    weight loss resolved gerd   Sleep apnea    cpap     Past Surgical History:  Procedure Laterality Date   SEPTOPLASTY  1980   x 2   TONSILLECTOMY AND ADENOIDECTOMY  1960    Family History  Problem Relation Age of Onset   Colon cancer Neg Hx    Stomach cancer Neg Hx      Social History   Tobacco Use  Smoking Status Never Smoker  Smokeless Tobacco Never Used    Social History   Substance and Sexual Activity  Alcohol Use Yes   Alcohol/week: 7.0 standard drinks   Types: 7 Glasses of wine per week     Allergies  Allergen Reactions   Codeine     headache    Current Outpatient Medications  Medication Sig Dispense Refill   aspirin EC 81 MG tablet Take 81 mg by mouth 2 (two) times a week.  diazepam (VALIUM) 5 MG tablet Take 1 tab 30 mins prior to procedure repeat as needed. 2 tablet 0   Current Facility-Administered Medications  Medication Dose Route Frequency Provider Last Rate Last Dose   0.9 %  sodium chloride infusion  500 mL Intravenous Continuous Irene Shipper, MD          Review of Systems:     Cardiac Review of Systems: [Y] = yes  or   [ N ] = no   Chest Pain [ n   ]  Resting SOB [ n  ] Exertional SOB  [n  ]  Orthopnea [  ]   Pedal Edema [   ]    Palpitations [  ] Syncope  [  ]   Presyncope [   ]   General Review of Systems: [Y] = yes [  ]=no Constitional: recent weight change [  ];  Wt loss over the last 3 months [   ] anorexia [  ]; fatigue [  ]; nausea [  ]; night sweats [  ]; fever [  ]; or chills [  ];           Eye : blurred vision [  ]; diplopia [   ]; vision changes [  ];  Amaurosis fugax[  ]; Resp: cough [  ];  wheezing[  ];  hemoptysis[  ]; shortness of breath[  ]; paroxysmal nocturnal dyspnea[  ]; dyspnea on exertion[  ]; or orthopnea[  ];  GI:  gallstones[  ], vomiting[  ];  dysphagia[  ]; melena[  ];  hematochezia [  ]; heartburn[  ];   Hx of  Colonoscopy[  ]; GU: kidney stones [  ]; hematuria[  ];   dysuria [  ];  nocturia[  ];  history of     obstruction [  ]; urinary frequency [   ]             Skin: rash, swelling[  ];, hair loss[  ];  peripheral edema[  ];  or itching[  ]; Musculosketetal: myalgias[  ];  joint swelling[  ];  joint erythema[  ];  joint pain[  ];  back pain[  ];  Heme/Lymph: bruising[  ];  bleeding[  ];  anemia[  ];  Neuro: TIA[  ];  headaches[  ];  stroke[  ];  vertigo[  ];  seizures[  ];   paresthesias[  ];  difficulty walking[  ];  Psych:depression[  ]; anxiety[  ];  Endocrine: diabetes[  ];  thyroid dysfunction[  ];  Immunizations: Flu up to date [  ]; Pneumococcal up to date [  ];  Other:     PHYSICAL EXAMINATION: BP 132/75    Pulse 76    Temp 100.1 F (37.8 C)    Resp 18    Wt 200 lb 12.8 oz (91.1 kg)    SpO2 97%    BMI 30.09 kg/m   Exam not done   Diagnostic Studies & Laboratory data:     Recent Radiology Findings:   Ct Chest W Contrast  Result Date: 12/23/2018 CLINICAL DATA:  Possible lung mass. EXAM: CT CHEST WITH CONTRAST TECHNIQUE: Multidetector CT imaging of the chest was performed during intravenous contrast administration. CONTRAST:  6mL ISOVUE-300 IOPAMIDOL (ISOVUE-300) INJECTION 61% COMPARISON:  CT scan of December 16, 2018. FINDINGS: Cardiovascular: Atherosclerosis of thoracic aorta is noted without aneurysm or dissection. Normal cardiac size. No pericardial effusion. Mediastinum/Nodes: No enlarged mediastinal, hilar, or  axillary lymph nodes. Thyroid gland, trachea, and esophagus demonstrate no significant findings. Lungs/Pleura: No pneumothorax or pleural effusion is noted. Left lung is clear. 16 x 11 mm mass is noted in the right lower lobe best seen on image number 119 of series 4. Upper Abdomen: No acute abnormality. Musculoskeletal: No chest wall abnormality. No acute or significant osseous findings. IMPRESSION: 16 x 11 mm nodule is noted in right lower lobe concerning for malignancy. Further evaluation with PET scan is recommended. These results will be called to the ordering clinician or representative by the Radiologist  Assistant, and communication documented in the PACS or zVision Dashboard. Aortic Atherosclerosis (ICD10-I70.0). Electronically Signed   By: Marijo Conception M.D.   On: 12/23/2018 14:22   Ct Cardiac Scoring  Result Date: 12/16/2018 CLINICAL DATA:  68 year old white male with hyperlipidemia. EXAM: CT HEART FOR CALCIUM SCORING TECHNIQUE: CT heart was performed using prospective ECG gating. A non-contrast exam for calcium scoring was performed. Note that this exam targets the heart and the chest was not imaged in its entirety. COMPARISON:  None. FINDINGS: Technical quality: Good. CORONARY CALCIUM Total Agatston Score: 87.9 MESA database percentile:  48 OTHER FINDINGS: Cardiovascular: Small amount of coronary calcium in the LAD, left circumflex and right coronary artery. Atherosclerotic calcifications in the thoracic aorta without aneurysm. No significant pericardial fluid. Mediastinum/Nodes: Question small hiatal hernia. No significant mediastinal lymphadenopathy. Limited evaluation for hilar lymphadenopathy on this non contrast examination. Lungs/Pleura: No large pleural effusions. Lobulated lesion in the anterior aspect of the right lower lobe on sequence 9, image 34. Difficult to accurately evaluate this lesion due to the adjacent vasculature and the lack of intravenous contrast. The lesion roughly measures 1.6 x 1.0 x 1.7 cm. Not clear if this represents a single lesion or small cluster of nodules. No other suspicious pulmonary lesions or nodules. Upper Abdomen: Images of the upper abdomen are unremarkable. Musculoskeletal: No acute abnormality. Mild dextroscoliosis in the thoracic spine. IMPRESSION: 1. Coronary calcium score is 87.9 and this is at percentile 48 for patients of the same age, gender and ethnicity. 2. Irregular lobulated lesion in the right lower lobe measuring up to 1.7 cm. This lesion is not well characterized on this noncontrast examination but concerning for a pulmonary neoplasm. Recommend  further characterization with a chest CT with IV contrast. 3.  Aortic Atherosclerosis (ICD10-I70.0). These results will be called to the ordering clinician or representative by the Radiologist Assistant, and communication documented in the PACS or zVision Dashboard. Electronically Signed   By: Markus Daft M.D.   On: 12/16/2018 21:59   Nm Pet Image Initial (pi) Skull Base To Thigh  Result Date: 12/25/2018 CLINICAL DATA:  Initial treatment strategy for pulmonary nodule. EXAM: NUCLEAR MEDICINE PET SKULL BASE TO THIGH TECHNIQUE: 10.9 mCi F-18 FDG was injected intravenously. Full-ring PET imaging was performed from the skull base to thigh after the radiotracer. CT data was obtained and used for attenuation correction and anatomic localization. Fasting blood glucose: Ninety-seven mg/dl COMPARISON:  CT scan dated 12/23/2018 FINDINGS: Mediastinal blood pool activity: SUV max 2.9 Liver activity: SUV max NA NECK: Symmetric physiologic activity along the tongue base. Incidental CT findings: none CHEST: The 1.1 cm right lower lobe peribronchovascular pulmonary nodule has maximum SUV of 2.9. No pathologically enlarged or hypermetabolic adenopathy in the chest is noted. Incidental CT findings: Aortic arch and circumflex coronary artery atherosclerotic calcification. Mild gynecomastia. ABDOMEN/PELVIS: No significant abnormal hypermetabolic activity in this region. Incidental CT findings: Aortoiliac atherosclerotic vascular disease. SKELETON: Subtle  activity posterolaterally in the right fifth rib, maximum SUV 2.3 as compared to contralateral side 1.0. No definite underlying CT abnormality. Incidental CT findings: Levoconvex lumbar scoliosis with rotary component IMPRESSION: 1. The right lower lobe peribronchovascular pulmonary nodule has maximum SUV of 2.9, suspicious for low-grade malignancy. No adenopathy identified. 2. Subtle accentuated activity posterolaterally in the right fifth rib. No underlying CT abnormality is  identified. Most likely this is incidental or due to occult trauma. Surveillance of the right posterolateral fifth rib on any follow up studies is suggested. 3. Levoconvex lumbar scoliosis. 4.  Aortic Atherosclerosis (ICD10-I70.0).  Coronary atherosclerosis. Electronically Signed   By: Van Clines M.D.   On: 12/25/2018 14:48     I have independently reviewed the above radiology studies  and reviewed the findings with the patient.   Recent Lab Findings: No results found for: WBC, HGB, HCT, PLT, GLUCOSE, CHOL, TRIG, HDL, LDLDIRECT, LDLCALC, ALT, AST, NA, K, CL, CREATININE, BUN, CO2, TSH, INR, GLUF, HGBA1C    Assessment / Plan:   Patient CT findings were reviewed this morning in the multidisciplinary thoracic pathology conference.  Unfortunately the patient arrived with a fever so full work-up was not done.  Patient will be sent for a COVID test, when this is been resolved and he has no fever will consider proceeding with pulmonary function studies, cardiology evaluation with elevated calcium score, and then plan to proceed with either primary resection versus navigation bronchoscopy and biopsy prior.        Grace Isaac MD      [address] [place] [address] Office [phone]   Beeper [phone]  01/08/2019 6:02 PM

## 2019-01-09 ENCOUNTER — Other Ambulatory Visit: Payer: Self-pay | Admitting: *Deleted

## 2019-01-09 ENCOUNTER — Encounter: Payer: Self-pay | Admitting: *Deleted

## 2019-01-09 ENCOUNTER — Other Ambulatory Visit (HOSPITAL_COMMUNITY)
Admission: RE | Admit: 2019-01-09 | Discharge: 2019-01-09 | Disposition: A | Discharge status: Home / Self Care | Payer: Medicare Other | Source: Ambulatory Visit | Attending: Cardiothoracic Surgery | Admitting: Cardiothoracic Surgery

## 2019-01-09 DIAGNOSIS — Z1159 Encounter for screening for other viral diseases: Secondary | ICD-10-CM | POA: Diagnosis present

## 2019-01-09 DIAGNOSIS — R911 Solitary pulmonary nodule: Secondary | ICD-10-CM

## 2019-01-09 LAB — SARS CORONAVIRUS 2 (TAT 6-24 HRS): SARS Coronavirus 2: NEGATIVE

## 2019-01-09 NOTE — Progress Notes (Signed)
Oncology Nurse Navigator Documentation  Oncology Nurse Navigator Flowsheets 01/09/2019  Navigator Location [facility]  Referral Date to RadOnc/MedOnc -  Navigator Encounter Type Clinic/MDC/Patient came to thoracic clinic yesterday to be seen with Dr. Servando Snare.  Unfortunately, patient had a low grade temp of 100.1 and Dr. Servando Snare was not will to see patient with current pandemic.  Dr. Servando Snare did speak to him at the door and updated patient on why he could not be seen and next steps.  Dr. Servando Snare asked me to call Dr. Silvestre Mesi office with an update on low grade temp.  I was unable to speak to nurse but did leave a vm message with details.  Dr. Servando Snare ordered COVID 19 testing for today.  Patient was frustrated that he was unable to be formally seen and Dr. Servando Snare explained why.  I walked patient out and kept apologizing for any inconvenience.    Telephone -  Abnormal Finding Date -  Multidisiplinary Clinic Date 01/08/2019  Patient Visit Type MedOnc  Treatment Phase Abnormal Scans  Barriers/Navigation Needs Coordination of Care;Education  Education Other  Interventions Coordination of Care;Education  Coordination of Care Other  Education Method -  Acuity Level 3  Time Spent with Patient 60

## 2019-01-10 ENCOUNTER — Telehealth: Payer: Self-pay | Admitting: Cardiothoracic Surgery

## 2019-01-10 NOTE — Telephone Encounter (Signed)
Call patient with results of recent coronavirus test which was negative, relayed to him we will continue with his evaluation of the right lung nodule with cardiology clearance and pulmonary function studies

## 2019-01-13 ENCOUNTER — Telehealth: Payer: Self-pay | Admitting: Cardiology

## 2019-01-13 NOTE — Progress Notes (Signed)
Cardiology Office Note   Date:  01/14/2019   ID:  [name], DOB 1950/09/07, MRN [id number]  PCP:  [name], MD  Cardiologist:   Minus Breeding, MD Referring:  [name], MD  Chief Complaint  Patient presents with  . Elevated Coronary Calcium      History of Present Illness: [name] is a 68 y.o. male who is referred by Dr. Servando Snare for preop evaluation.  He is to have either primary resection or a bronchoscopy.  He is noted to have coronary calcium.   His calcium score was 88.  This puts him around the 50th percentile.  This was done because of cardiovascular risk factors.  He says he is very active.  He denies any chest pressure, neck or arm discomfort.  He denies any shortness of breath, PND or orthopnea.  He said no palpitations, presyncope or syncope.  He does heavy work.  He has been a runner and fast walker.  At this point he brings on no symptoms.  Of note he was noted to have a lung nodule on his CT is being considered as above for management.  Past Medical History:  Diagnosis Date  . Eczema 08/08/2010  . GERD (gastroesophageal reflux disease)    weight loss resolved gerd  . Sleep apnea    cpap    Past Surgical History:  Procedure Laterality Date  . SEPTOPLASTY  1980   x 2  . TONSILLECTOMY AND ADENOIDECTOMY  1960     Current Outpatient Medications  Medication Sig Dispense Refill  . rosuvastatin (CRESTOR) 10 MG tablet Take 1 tablet by mouth daily.     Current Facility-Administered Medications  Medication Dose Route Frequency Provider Last Rate Last Dose  . 0.9 %  sodium chloride infusion  500 mL Intravenous Continuous Irene Shipper, MD        Allergies:   Codeine    Social History:  The patient  reports that he has never smoked. He has never used smokeless tobacco. He reports current alcohol use of about 7.0 standard drinks of alcohol per week. He reports that he does not use drugs.   Family History:  The patient's family history  includes Heart disease in his mother.    ROS:  Please see the history of present illness.   Otherwise, review of systems are positive for none.   All other systems are reviewed and negative.    PHYSICAL EXAM: VS:  BP (!) 146/79   Pulse 62   Temp 98.2 F (36.8 C) (Temporal)   Ht 5\' 10"  (1.778 m)   Wt 203 lb 6.4 oz (92.3 kg)   SpO2 100%   BMI 29.18 kg/m  , BMI Body mass index is 29.18 kg/m. GENERAL:  Well appearing HEENT:  Pupils equal round and reactive, fundi not visualized, oral mucosa unremarkable NECK:  No jugular venous distention, waveform within normal limits, carotid upstroke brisk and symmetric, no bruits, no thyromegaly LYMPHATICS:  No cervical, inguinal adenopathy LUNGS:  Clear to auscultation bilaterally BACK:  No CVA tenderness CHEST:  Unremarkable HEART:  PMI not displaced or sustained,S1 and S2 within normal limits, no S3, no S4, no clicks, no rubs, no murmurs ABD:  Flat, positive bowel sounds normal in frequency in pitch, no bruits, no rebound, no guarding, no midline pulsatile mass, no hepatomegaly, no splenomegaly EXT:  2 plus pulses throughout, no edema, no cyanosis no clubbing SKIN:  No rashes no nodules NEURO:  Cranial nerves II through  XII grossly intact, motor grossly intact throughout PSYCH:  Cognitively intact, oriented to person place and time    EKG:  EKG is ordered today. The ekg ordered today demonstrates sinus rhythm, rate 62, left axis deviation, no acute ST-T wave changes, short PR interval.   Recent Labs: No results found for requested labs within last 8760 hours.    Lipid Panel No results found for: CHOL, TRIG, HDL, CHOLHDL, VLDL, LDLCALC, LDLDIRECT    Wt Readings from Last 3 Encounters:  01/14/19 203 lb 6.4 oz (92.3 kg)  01/08/19 200 lb 12.8 oz (91.1 kg)  01/05/19 200 lb (90.7 kg)      Other studies Reviewed: Additional studies/ records that were reviewed today include: CT, labs Review of the above records demonstrates:  Please  see elsewhere in the note.     ASSESSMENT AND PLAN:  PREOP CARDIOVASCULAR: The patient has a very high functional level.  He is going for a low risk procedure even if he were to have wedge resection.  He has no symptoms.  He has no high risk cardiovascular findings.  Given this and according to ACC/AHA guidelines no further cardiovascular testing is indicated.  CORONARY CALCIUM: We had a long discussion about risk reduction.  He is very active and has no symptoms.  I did calculate a MESA score and it was 7.7%.  At this point in the absence of any symptoms no further screening is indicated although I might do this in a couple of years with a POET (Plain Old Exercise Treadmill).  Certainly in the future if he gets any chest discomfort we would need to see him.  DYSLIPIDEMIA: He has had an elevated LDL with a MESA score is written and I do agree with lipid-lowering agents with a goal LDL less than 70.  We talked about the Mediterranean diet.  Current medicines are reviewed at length with the patient today.  The patient does not have concerns regarding medicines.  The following changes have been made:  no change  Labs/ tests ordered today include: None  Orders Placed This Encounter  Procedures  . EKG 12-Lead     Disposition:   FU with me in a couple of years.     Signed, Minus Breeding, MD  01/14/2019 1:01 PM    [place]

## 2019-01-13 NOTE — Telephone Encounter (Signed)

## 2019-01-14 ENCOUNTER — Other Ambulatory Visit: Payer: Self-pay

## 2019-01-14 ENCOUNTER — Encounter: Payer: Self-pay | Admitting: Cardiology

## 2019-01-14 ENCOUNTER — Encounter: Payer: Self-pay | Admitting: Cardiothoracic Surgery

## 2019-01-14 ENCOUNTER — Ambulatory Visit (HOSPITAL_COMMUNITY)
Admission: RE | Admit: 2019-01-14 | Discharge: 2019-01-14 | Disposition: A | Discharge status: Home / Self Care | Payer: Medicare Other | Source: Ambulatory Visit | Attending: Cardiothoracic Surgery | Admitting: Cardiothoracic Surgery

## 2019-01-14 ENCOUNTER — Ambulatory Visit (INDEPENDENT_AMBULATORY_CARE_PROVIDER_SITE_OTHER): Payer: Medicare Other | Admitting: Cardiology

## 2019-01-14 VITALS — BP 146/79 | HR 62 | Temp 98.2°F | Ht 70.0 in | Wt 203.4 lb

## 2019-01-14 DIAGNOSIS — E785 Hyperlipidemia, unspecified: Secondary | ICD-10-CM | POA: Diagnosis not present

## 2019-01-14 DIAGNOSIS — R931 Abnormal findings on diagnostic imaging of heart and coronary circulation: Secondary | ICD-10-CM

## 2019-01-14 DIAGNOSIS — Z01811 Encounter for preprocedural respiratory examination: Secondary | ICD-10-CM | POA: Insufficient documentation

## 2019-01-14 DIAGNOSIS — R911 Solitary pulmonary nodule: Secondary | ICD-10-CM | POA: Diagnosis present

## 2019-01-14 LAB — PULMONARY FUNCTION TEST
DL/VA % pred: 106 %
DL/VA: 4.36 ml/min/mmHg/L
DLCO unc % pred: 95 %
DLCO unc: 25.14 ml/min/mmHg
FEF 25-75 Post: 5 L/sec
FEF 25-75 Pre: 4.05 L/sec
FEF2575-%Change-Post: 23 %
FEF2575-%Pred-Post: 190 %
FEF2575-%Pred-Pre: 154 %
FEV1-%Change-Post: 4 %
FEV1-%Pred-Post: 105 %
FEV1-%Pred-Pre: 101 %
FEV1-Post: 3.54 L
FEV1-Pre: 3.4 L
FEV1FVC-%Change-Post: 3 %
FEV1FVC-%Pred-Pre: 114 %
FEV6-%Change-Post: 0 %
FEV6-%Pred-Post: 94 %
FEV6-%Pred-Pre: 93 %
FEV6-Post: 4.04 L
FEV6-Pre: 4.01 L
FEV6FVC-%Change-Post: 0 %
FEV6FVC-%Pred-Post: 105 %
FEV6FVC-%Pred-Pre: 105 %
FVC-%Change-Post: 0 %
FVC-%Pred-Post: 89 %
FVC-%Pred-Pre: 88 %
FVC-Post: 4.04 L
FVC-Pre: 4.01 L
Post FEV1/FVC ratio: 88 %
Post FEV6/FVC ratio: 100 %
Pre FEV1/FVC ratio: 85 %
Pre FEV6/FVC Ratio: 100 %
RV % pred: 78 %
RV: 1.87 L
TLC % pred: 85 %
TLC: 6.02 L

## 2019-01-14 MED ORDER — ALBUTEROL SULFATE (2.5 MG/3ML) 0.083% IN NEBU
2.5000 mg | INHALATION_SOLUTION | Freq: Once | RESPIRATORY_TRACT | Status: AC
  Administered 2019-01-14: 09:00:00 2.5 mg via RESPIRATORY_TRACT

## 2019-01-14 NOTE — Patient Instructions (Signed)
Follow-Up: You will need a follow up appointment AS NEEDED-PLEASE CALL WITH ANYTHING YOU MAY NEED.  You may see Minus Breeding, MD or one of the following Advanced Practice Providers on your designated Care Team: Rosaria Ferries, PA-C Jory Sims, DNP, ANP       Medication Instructions:  The current medical regimen is effective;  continue present plan and medications as directed. Please refer to the Current Medication list given to you today. If you need a refill on your cardiac medications before your next appointment, please call your pharmacy. Labwork: When you have labs (blood work) and your tests are completely normal, you will receive your results ONLY by [place] (if you have MyChart) -OR- A paper copy in the mail.  At [facility], you and your health needs are our priority.  As part of our continuing mission to provide you with exceptional heart care, we have created designated Provider Care Teams.  These Care Teams include your primary Cardiologist (physician) and Advanced Practice Providers (APPs -  Physician Assistants and Nurse Practitioners) who all work together to provide you with the care you need, when you need it.  Thank you for choosing [facility] at [facility]!!

## 2019-01-14 NOTE — Progress Notes (Signed)
[address]       [place] Guzman,[name]             [phone]                    [name] [place] Medical Record #[id number] Date of Birth: May 13, 1951  Referring: [name], MD Primary Care: [name], MD Primary Cardiologist: Minus Breeding, MD  Chief Complaint:    Chief Complaint  Patient presents with   Lung Mass    1 week f/u review PFT's     History of Present Illness:      [name] 68 y.o. male is seen in the office  today for for incidental finding of a 16 x 9 mm lung mass in the right lower lobe.  The patient is a lifelong non-smoker.  Recently had a calcium score CT done of the heart which noted an incidental finding subsequently a CT of the chest and PET scan have been done confirming a very mildly avid right lower lobe lung nodule.  Patient has no pulmonary symptoms.  Patient has no history of trauma and no symptoms referable to the right rib.   Is retired long placement, notes that he is never been employed or worked Architect or around potential exposures to asbestos.     Current Activity/ Functional Status:  Patient is independent with mobility/ambulation, transfers, ADL's, IADL's.   Zubrod Score: At the time of surgery this patients most appropriate activity status/level should be described as: [x]     0    Normal activity, no symptoms []     1    Restricted in physical strenuous activity but ambulatory, able to do out light work []     2    Ambulatory and capable of self care, unable to do work activities, up and about               >50 % of waking hours                              []     3    Only limited self care, in bed greater than 50% of waking hours []     4    Completely disabled, no self care, confined to bed or chair []     5    Moribund   Past Medical History:  Diagnosis Date   Eczema 08/08/2010   GERD (gastroesophageal reflux disease)    weight loss resolved gerd   Sleep apnea    cpap      Past Surgical History:  Procedure Laterality Date   SEPTOPLASTY  1980   x 2   TONSILLECTOMY AND ADENOIDECTOMY  1960    Family History  Problem Relation Age of Onset   Heart disease Mother        CABG age 54s   Colon cancer Neg Hx    Stomach cancer Neg Hx      Social History   Tobacco Use  Smoking Status Never Smoker  Smokeless Tobacco Never Used    Social History   Substance and Sexual Activity  Alcohol Use Yes   Alcohol/week: 7.0 standard drinks   Types: 7 Glasses of wine per week     Allergies  Allergen Reactions   Codeine     headache    Current Outpatient Medications  Medication Sig Dispense Refill   rosuvastatin (CRESTOR) 10  MG tablet Take 1 tablet by mouth daily.     Current Facility-Administered Medications  Medication Dose Route Frequency Provider Last Rate Last Dose   0.9 %  sodium chloride infusion  500 mL Intravenous Continuous Irene Shipper, MD        Pertinent items are noted in HPI.   Review of Systems:     Cardiac Review of Systems: [Y] = yes  or   [ N ] = no   Chest Pain [ n   ]  Resting SOB [  n ] Exertional SOB  [ n ]  Orthopnea [ n ]   Pedal Edema [ n  ]    Palpitations [n  ] Syncope  [ n ]   Presyncope [ n ]   General Review of Systems: [Y] = yes [  ]=no Constitional: recent weight change [  ];  Wt loss over the last 3 months [   ] anorexia [  ]; fatigue [  ]; nausea [  ]; night sweats [  ]; fever [  ]; or chills [  ];           Eye : blurred vision [  ]; diplopia [   ]; vision changes [  ];  Amaurosis fugax[  ]; Resp: cough [  ];  wheezing[  ];  hemoptysis[  ]; shortness of breath[  ]; paroxysmal nocturnal dyspnea[  ]; dyspnea on exertion[  ]; or orthopnea[  ];  GI:  gallstones[  ], vomiting[  ];  dysphagia[  ]; melena[  ];  hematochezia [  ]; heartburn[  ];   Hx of  Colonoscopy[  ]; GU: kidney stones [  ]; hematuria[  ];   dysuria [  ];  nocturia[  ];  history of     obstruction [  ]; urinary frequency [  ]              Skin: rash, swelling[  ];, hair loss[  ];  peripheral edema[  ];  or itching[  ]; Musculosketetal: myalgias[  ];  joint swelling[  ];  joint erythema[  ];  joint pain[  ];  back pain[  ];  Heme/Lymph: bruising[  ];  bleeding[  ];  anemia[  ];  Neuro: TIA[  ];  headaches[  ];  stroke[  ];  vertigo[  ];  seizures[  ];   paresthesias[  ];  difficulty walking[  ];  Psych:depression[  ]; anxiety[  ];  Endocrine: diabetes[  ];  thyroid dysfunction[  ];  Immunizations: Flu up to date [  ]; Pneumococcal up to date [  ];  Other:     PHYSICAL EXAMINATION: BP (!) 150/83    Pulse 63    Temp 97.7 F (36.5 C) (Skin)    Resp 20    Ht 5\' 10"  (1.778 m)    Wt 202 lb (91.6 kg)    SpO2 97% Comment: RA   BMI 28.98 kg/m  General appearance: alert, cooperative, appears stated age and no distress Head: Normocephalic, without obvious abnormality, atraumatic Neck: no adenopathy, no carotid bruit, no JVD, supple, symmetrical, trachea midline and thyroid not enlarged, symmetric, no tenderness/mass/nodules Lymph nodes: Cervical, supraclavicular, and axillary nodes normal. Resp: clear to auscultation bilaterally Cardio: regular rate and rhythm, S1, S2 normal, no murmur, click, rub or gallop GI: soft, non-tender; bowel sounds normal; no masses,  no organomegaly Extremities: extremities normal, atraumatic, no cyanosis or edema Neurologic: Grossly normal  Diagnostic Studies &  Laboratory data:     Recent Radiology Findings:   Ct Chest W Contrast  Result Date: 12/23/2018 CLINICAL DATA:  Possible lung mass. EXAM: CT CHEST WITH CONTRAST TECHNIQUE: Multidetector CT imaging of the chest was performed during intravenous contrast administration. CONTRAST:  82mL ISOVUE-300 IOPAMIDOL (ISOVUE-300) INJECTION 61% COMPARISON:  CT scan of December 16, 2018. FINDINGS: Cardiovascular: Atherosclerosis of thoracic aorta is noted without aneurysm or dissection. Normal cardiac size. No pericardial effusion. Mediastinum/Nodes: No enlarged  mediastinal, hilar, or axillary lymph nodes. Thyroid gland, trachea, and esophagus demonstrate no significant findings. Lungs/Pleura: No pneumothorax or pleural effusion is noted. Left lung is clear. 16 x 11 mm mass is noted in the right lower lobe best seen on image number 119 of series 4. Upper Abdomen: No acute abnormality. Musculoskeletal: No chest wall abnormality. No acute or significant osseous findings. IMPRESSION: 16 x 11 mm nodule is noted in right lower lobe concerning for malignancy. Further evaluation with PET scan is recommended. These results will be called to the ordering clinician or representative by the Radiologist Assistant, and communication documented in the PACS or zVision Dashboard. Aortic Atherosclerosis (ICD10-I70.0). Electronically Signed   By: Marijo Conception M.D.   On: 12/23/2018 14:22   Ct Cardiac Scoring  Result Date: 12/16/2018 CLINICAL DATA:  68 year old white male with hyperlipidemia. EXAM: CT HEART FOR CALCIUM SCORING TECHNIQUE: CT heart was performed using prospective ECG gating. A non-contrast exam for calcium scoring was performed. Note that this exam targets the heart and the chest was not imaged in its entirety. COMPARISON:  None. FINDINGS: Technical quality: Good. CORONARY CALCIUM Total Agatston Score: 87.9 MESA database percentile:  48 OTHER FINDINGS: Cardiovascular: Small amount of coronary calcium in the LAD, left circumflex and right coronary artery. Atherosclerotic calcifications in the thoracic aorta without aneurysm. No significant pericardial fluid. Mediastinum/Nodes: Question small hiatal hernia. No significant mediastinal lymphadenopathy. Limited evaluation for hilar lymphadenopathy on this non contrast examination. Lungs/Pleura: No large pleural effusions. Lobulated lesion in the anterior aspect of the right lower lobe on sequence 9, image 34. Difficult to accurately evaluate this lesion due to the adjacent vasculature and the lack of intravenous contrast. The  lesion roughly measures 1.6 x 1.0 x 1.7 cm. Not clear if this represents a single lesion or small cluster of nodules. No other suspicious pulmonary lesions or nodules. Upper Abdomen: Images of the upper abdomen are unremarkable. Musculoskeletal: No acute abnormality. Mild dextroscoliosis in the thoracic spine. IMPRESSION: 1. Coronary calcium score is 87.9 and this is at percentile 48 for patients of the same age, gender and ethnicity. 2. Irregular lobulated lesion in the right lower lobe measuring up to 1.7 cm. This lesion is not well characterized on this noncontrast examination but concerning for a pulmonary neoplasm. Recommend further characterization with a chest CT with IV contrast. 3.  Aortic Atherosclerosis (ICD10-I70.0). These results will be called to the ordering clinician or representative by the Radiologist Assistant, and communication documented in the PACS or zVision Dashboard. Electronically Signed   By: Markus Daft M.D.   On: 12/16/2018 21:59   Nm Pet Image Initial (pi) Skull Base To Thigh  Result Date: 12/25/2018 CLINICAL DATA:  Initial treatment strategy for pulmonary nodule. EXAM: NUCLEAR MEDICINE PET SKULL BASE TO THIGH TECHNIQUE: 10.9 mCi F-18 FDG was injected intravenously. Full-ring PET imaging was performed from the skull base to thigh after the radiotracer. CT data was obtained and used for attenuation correction and anatomic localization. Fasting blood glucose: Ninety-seven mg/dl COMPARISON:  CT scan dated  12/23/2018 FINDINGS: Mediastinal blood pool activity: SUV max 2.9 Liver activity: SUV max NA NECK: Symmetric physiologic activity along the tongue base. Incidental CT findings: none CHEST: The 1.1 cm right lower lobe peribronchovascular pulmonary nodule has maximum SUV of 2.9. No pathologically enlarged or hypermetabolic adenopathy in the chest is noted. Incidental CT findings: Aortic arch and circumflex coronary artery atherosclerotic calcification. Mild gynecomastia. ABDOMEN/PELVIS:  No significant abnormal hypermetabolic activity in this region. Incidental CT findings: Aortoiliac atherosclerotic vascular disease. SKELETON: Subtle activity posterolaterally in the right fifth rib, maximum SUV 2.3 as compared to contralateral side 1.0. No definite underlying CT abnormality. Incidental CT findings: Levoconvex lumbar scoliosis with rotary component IMPRESSION: 1. The right lower lobe peribronchovascular pulmonary nodule has maximum SUV of 2.9, suspicious for low-grade malignancy. No adenopathy identified. 2. Subtle accentuated activity posterolaterally in the right fifth rib. No underlying CT abnormality is identified. Most likely this is incidental or due to occult trauma. Surveillance of the right posterolateral fifth rib on any follow up studies is suggested. 3. Levoconvex lumbar scoliosis. 4.  Aortic Atherosclerosis (ICD10-I70.0).  Coronary atherosclerosis. Electronically Signed   By: Van Clines M.D.   On: 12/25/2018 14:48     I have independently reviewed the above radiology studies  and reviewed the findings with the patient.   Recent Lab Findings: No results found for: WBC, HGB, HCT, PLT, GLUCOSE, CHOL, TRIG, HDL, LDLDIRECT, LDLCALC, ALT, AST, NA, K, CL, CREATININE, BUN, CO2, TSH, INR, GLUF, HGBA1C  PFT's FEV1 3.4 101% DLCO 25.1 95% The FVC, FEV1, FEV1/FVC ratio and FEF25-75% are within normal limits. The airway resistance is normal. Lung volumes are within normal limits. Following administration of bronchodilators, there is no significant response. The diffusing capacity is normal. However, the diffusing capacity was not corrected for the patient's hemoglobin. Conclusions: The results are within normal limits. Pulmonary Function Diagnosis: Normal Pulmonary Function  Assessment / Plan:   1/16 x 11 mm nodule is noted in right lower lobe concerning for malignancy.  Found incidentally on calcium scoring CTmaximum SUV of 2.9, suspicious for low-grade malignancy. No  adenopathy identified. 2/subtle accentuated activity posterolaterally in the right fifth rib   I discussed with the patient and his wife both the diagnostic and treatment issues involved with the suspicious right lower lobe lung nodule.  The option of proceeding directly with resection both for diagnosis and treatment versus attempt navigation bronchoscopy and biopsy and then surgical resection were discussed.  Risks and options of surgery including expectations and recovery time were reviewed.  The patient is inclined to proceed with direct resection however he would like to wait a day or 2 and discuss the situation with his wife before making a final decision and will call the office back.  Grace Isaac MD      [address] [place] [address] Office [phone]   [place] [phone]  01/15/2019 11:03 AM

## 2019-01-15 ENCOUNTER — Ambulatory Visit (INDEPENDENT_AMBULATORY_CARE_PROVIDER_SITE_OTHER): Payer: Medicare Other | Admitting: Cardiothoracic Surgery

## 2019-01-15 VITALS — BP 150/83 | HR 63 | Temp 97.7°F | Resp 20 | Ht 70.0 in | Wt 202.0 lb

## 2019-01-15 DIAGNOSIS — R911 Solitary pulmonary nodule: Secondary | ICD-10-CM | POA: Diagnosis not present

## 2019-01-15 NOTE — Patient Instructions (Signed)
Pulmonary Nodule A pulmonary nodule is a small, round growth of tissue in the lung. It is sometimes referred to as a "shadow" or "spot on the lung." Nodules range in size from less than 1/5 of an inch (4 mm) to a little bigger than an inch (30 mm). Pulmonary nodules can be either noncancerous (benign) or cancerous (malignant). Most are noncancerous. Smaller nodules in people who do not smoke and do not have any other risk factors for lung cancer are more likely to be noncancerous. Larger, irregular nodules in people who smoke or who have a strong family history of lung cancer are more likely to be cancerous. What are the causes? This condition may be caused by:  A bacterial, fungal, or viral infection, such as tuberculosis. The infection is usually an old and inactive one.  A noncancerous mass of tissue.  Inflammation from conditions such as rheumatoid arthritis.  Abnormal blood vessels in the lungs.  Cancerous tissue, such as lung cancer or a cancer in another part of the body that has spread to the lung. What are the signs or symptoms? This condition usually does not cause symptoms. If symptoms appear, they are usually related to the underlying cause. For example, if the condition is caused by an infection, you may have a cough or fever. How is this diagnosed? This condition is usually diagnosed with an X-ray or CT scan. To help determine whether a pulmonary nodule is benign or malignant, your health care provider will:  Take your medical history.  Perform a physical exam.  Order tests, including: ? Blood tests. ? A skin test called a tuberculin test. This test is done to check if you have been exposed to the germ that causes tuberculosis. ? Chest X-rays. ? A CT scan. This test shows smaller pulmonary nodules more clearly and with more detail than an X-ray. ? A positron emission tomography (PET) scan. This test is done to check if the nodule is cancerous. During the test, a safe amount  of a radioactive substance is injected into the bloodstream. Then a picture is taken. ? Biopsy. In this test, a tiny piece of the pulmonary nodule is removed and then examined under a microscope. How is this treated? Treatment for this condition depends on whether the pulmonary nodule is malignant or benign as well as your risk of getting cancer.  Noncancerous nodules usually do not need to be treated, but they may need to be monitored with CT scans. If a CT scan shows that the pulmonary nodule got bigger, more tests may be done.  Some nodules need to be removed. If this is the case, you may have a procedure called a thoractomy. During the procedure, your health care provider will make an incision in your chest and remove the part of the lung where the nodule is located. Follow these instructions at home:   Take over-the-counter and prescription medicines only as told by your health care provider.  Do not use any products that contain nicotine or tobacco, such as cigarettes and e-cigarettes. If you need help quitting, ask your health care provider.  Keep all follow-up visits as told by your health care provider. This is important. Contact a health care provider if:  You have trouble breathing when you are active.  You feel sick or unusually tired.  You do not feel like eating.  You lose weight without trying.  You develop chills or night sweats. Get help right away if:  You cannot catch your breath.    You begin wheezing.  You cannot stop coughing.  You cough up blood.  You become dizzy or feel like you are going to faint.  You have sudden chest pain.  You have a fever or persistent symptoms for more than 2-3 days.  You have a fever and your symptoms suddenly get worse. Summary  A pulmonary nodule is a small, round growth of tissue in the lung. Most pulmonary nodules are noncancerous.  This condition is usually diagnosed with an X-ray or CT scan.  Common causes of  pulmonary nodules include infection, inflammation, and noncancerous growths.  Though less common, if a nodule is found to be cancerous, you will need specific diagnostic tests and treatment options as directed by your medical provider.  Treatment for this condition depends on whether the pulmonary nodule is benign or malignant as well as your risk of getting cancer. This information is not intended to replace advice given to you by your health care provider. Make sure you discuss any questions you have with your health care provider. Document Released: 04/08/2009 Document Revised: 07/05/2017 Document Reviewed: 07/10/2016 Elsevier Patient Education  2020 [facility] Video-assisted thoracic surgery (VATS) is a procedure that allows your surgeon to look inside your chest and perform minor procedures as needed. The thoracic area is between the neck and abdomen. VATS is commonly done to:  Study or diagnose problems in the chest.  Remove a tissue sample (biopsy) to be examined.  Put medicines directly into the chest.  Remove collections of fluid, pus, or blood.  Remove tumors.  Remove a part of a lung (lobectomy).  Treat some problems with the spine, including: ? Abnormal curves (scoliosis or kyphosis). ? Breaks (fractures). ? Tumors. VATS is done using thoracoscopy. Thoracoscopy is a procedure in which a thin tube with a light and camera on the end (thoracoscope) is inserted through a small incision in the chest wall. The thoracoscope sends images to a video monitor that your surgeon will use to view the inside of your chest. Tell a health care provider about:  Any allergies you have.  All medicines you are taking, including vitamins, herbs, eye drops, creams, and over-the-counter medicines.  Any problems you or family members have had with anesthetic medicines.  Any surgeries you have had.  Any medical conditions you have.  Any blood disorders  you have.  Whether you are pregnant or may be pregnant. What are the risks? Generally, this is a safe procedure. However, problems may occur, including:  Infection.  Severe bleeding (hemorrhage).  Allergic reaction to medicines.  Damage to structures or organs in the chest, such as nerves.  Lung infection (pneumonia).  Inability to complete the procedure. If this is the case, your chest may need to be opened with a large incision (thoracotomy). What happens before the procedure? Medicines  Ask your health care provider about: ? Changing or stopping your regular medicines. This is especially important if you are taking diabetes medicines or blood thinners. ? Taking medicines such as aspirin and ibuprofen. These medicines can thin your blood. Do not take these medicines before your procedure if your health care provider instructs you not to.  You may be given antibiotic medicine to help prevent infection. Staying hydrated Follow instructions from your health care provider about hydration, which may include:  Up to 2 hours before the procedure - you may continue to drink clear liquids, such as water, clear fruit juice, black coffee, and plain tea. Eating and drinking  restrictions Follow instructions from your health care provider about eating and drinking, which may include:  8 hours before the procedure - stop eating heavy meals or foods such as meat, fried foods, or fatty foods.  6 hours before the procedure - stop eating light meals or foods, such as toast or cereal.  6 hours before the procedure - stop drinking milk or drinks that contain milk.  2 hours before the procedure - stop drinking clear liquids. General instructions  Ask your health care provider how your surgical site will be marked or identified.  You may be asked to shower with a germ-killing soap.  You may have a blood or urine sample taken.  You may have imaging tests, such as: ? Chest X-ray. ?  Electrocardiogram (ECG). ? Ultrasound. ? CT scan.  Do not use any products that contain nicotine or tobacco for as long as possible before your procedure. These include cigarettes and e-cigarettes. If you need help quitting, ask your health care provider.  Plan to have someone take you home from the hospital or clinic.  If you will be going home right after the procedure, plan to have someone with you for 24 hours. What happens during the procedure?  To lower your risk of infection: ? Your health care team will wash or sanitize their hands. ? Your skin will be washed with soap.  An IV tube will be inserted into one of your veins.  You will be given one or more of the following: ? A medicine to make you fall asleep (general anesthetic). ? A medicine to help you relax (sedative). ? A medicine that is injected into an area of your body to numb everything below the injection site (regional anesthetic). This is less common for this procedure. It may be given if you are not able to tolerate general anesthetic.  A thin tube (catheter) will be inserted into your bladder and your tube that carries urine out of your body (urethra). The catheter will drain your urine.  1-4 small incisions will be made in your chest. The number of incisions depends on the purpose of your procedure.  The thoracoscope will be inserted into your incision(s) and used to view the inside of your chest.  One of your lungs will be deflated. This makes it easier for your surgeon to see the area.  Other surgical instruments may be inserted through your incision(s) to perform necessary procedures.  After any procedures are done, your lung will be inflated.  A chest tube may be inserted through an incision to drain excess fluid from the surgical area.  Any remaining incisions will be closed with stitches (sutures) or staples.  A bandage (dressing) may be placed over your incision(s). The procedure may vary among  health care providers and hospitals. What happens after the procedure?  Your blood pressure, heart rate, breathing rate, and blood oxygen level will be monitored until the medicines you were given have worn off.  You may have a chest tube draining fluid from the surgical area. The chest tube will be closely monitored for signs of fluid or air buildup in your lungs.  You may continue to receive fluids and medicines through an IV tube.  You may have to wear compression stockings. These stockings help to prevent blood clots and reduce swelling in your legs.  Do not drive for 24 hours if you were given a sedative. Summary  Video-assisted thoracic surgery (VATS) is a procedure that allows your surgeon to  look inside your chest to study or diagnose problems in your thoracic area.  VATS is done by inserting a thin tube with a light and camera on the end (thoracoscope) through a small incision in the chest wall.  After the procedure, you may have a chest tube draining fluid from the surgical area. The chest tube will be closely monitored for signs of fluid or air buildup in your lungs. This information is not intended to replace advice given to you by your health care provider. Make sure you discuss any questions you have with your health care provider. Document Released: 10/06/2012 Document Revised: 05/24/2017 Document Reviewed: 05/28/2016 Elsevier Patient Education  2020 [facility], Care After This sheet gives you information about how to care for yourself after your procedure. Your health care provider may also give you more specific instructions. If you have problems or questions, contact your health care provider. What can I expect after the procedure? After the procedure, it is common to have:  Some pain and soreness in your chest.  Pain when breathing in (inhaling) and coughing.  Constipation.  Fatigue.  Difficulty sleeping. Follow these  instructions at home: Preventing pneumonia  Take deep breaths or do breathing exercises as instructed by your health care provider. Doing this helps prevent lung infection (pneumonia).  Cough frequently. Coughing may cause discomfort, but it is important to clear mucus (phlegm) and expand your lungs. If it hurts to cough, hold a pillow against your chest or place the palms of both hands on top of the incision (use splinting) when you cough. This may help relieve discomfort.  If you were given an incentive spirometer, use it as directed. An incentive spirometer is a tool that measures how well you are filling your lungs with each breath.  Participate in pulmonary rehabilitation as directed by your health care provider. This is a program that combines education, exercise, and support from a team of specialists. The goal is to help you heal and get back to your normal activities as soon as possible. Medicines  Take over-the-counter or prescription medicines only as told by your health care provider.  If you have pain, take pain-relieving medicine before your pain becomes severe. This is important because if your pain is under control, you will be able to breathe and cough more comfortably.  If you were prescribed an antibiotic medicine, take it as told by your health care provider. Do not stop taking the antibiotic even if you start to feel better. Activity  Ask your health care provider what activities are safe for you.  Avoid activities that use your chest muscles for at least 3-4 weeks.  Do not lift anything that is heavier than 10 lb (4.5 kg), or the limit that your health care provider tells you, until he or she says that it is safe. Incision care  Follow instructions from your health care provider about how to take care of your incision(s). Make sure you: ? Wash your hands with soap and water before you change your bandage (dressing). If soap and water are not available, use hand  sanitizer. ? Change your dressing as told by your health care provider. ? Leave stitches (sutures), skin glue, or adhesive strips in place. These skin closures may need to stay in place for 2 weeks or longer. If adhesive strip edges start to loosen and curl up, you may trim the loose edges. Do not remove adhesive strips completely unless your health care provider tells  you to do that.  Keep your dressing dry until it has been removed.  Check your incision area every day for signs of infection. Check for: ? Redness, swelling, or pain. ? Fluid or blood. ? Warmth. ? Pus or a bad smell. Bathing  Do not take baths, swim, or use a hot tub until your health care provider approves. You may take showers.  After your dressing has been removed, use soap and water to gently wash your incision area. Do not use anything else to clean your incision(s) unless your health care provider tells you to do this. Driving   Do not drive until your health care provider approves.  Do not drive or use heavy machinery while taking prescription pain medicine. Eating and drinking  Eat a healthy, balanced diet as instructed by your health care provider. A healthy diet includes plenty of fresh fruits and vegetables, whole grains, and low-fat (lean) proteins.  Limit foods that are high in fat and processed sugars, such as fried and sweet foods.  Drink enough fluid to keep your urine clear or pale yellow. General instructions   To prevent or treat constipation while you are taking prescription pain medicine, your health care provider may recommend that you: ? Take over-the-counter or prescription medicines. ? Eat foods that are high in fiber, such as beans, fresh fruits and vegetables, and whole grains.  Do not use any products that contain nicotine or tobacco, such as cigarettes and e-cigarettes. If you need help quitting, ask your health care provider.  Avoid secondhand smoke.  Wear compression stockings as  told by your health care provider. These stockings help to prevent blood clots and reduce swelling in your legs.  If you have a chest tube, care for it as instructed by your health care provider. Do not travel by airplane during the 2 weeks after your chest tube is removed, or until your health care provider says that this is safe.  Keep all follow-up visits as told by your health care provider. This is important. Contact a health care provider if:  You have redness, swelling, or pain around an incision.  You have fluid or blood coming from an incision.  Your incision area feels warm to the touch.  You have pus or a bad smell coming from an incision.  You have a fever or chills.  You have nausea or vomiting.  You have pain that does not get better with medicine. Get help right away if:  You have chest pain.  Your heart is fluttering or beating rapidly.  You develop a rash.  You have shortness of breath or trouble breathing.  You are confused.  You have trouble speaking.  You feel weak, light-headed, or dizzy.  You faint. Summary  To help prevent lung infection (pneumonia), take deep breaths or do breathing exercises as instructed by your health care provider.  Cough frequently to clear mucus (phlegm) and expand your lungs. If it hurts to cough, hold a pillow against your chest or place the palms of both hands on top of the incision (use splinting) when you cough.  If you have pain, take pain-relieving medicine before your pain becomes severe. This is important because if your pain is under control, you will be able to breathe and cough more comfortably.  Ask your health care provider what activities are safe for you. This information is not intended to replace advice given to you by your health care provider. Make sure you discuss any questions you  have with your health care provider. Document Released: 10/06/2012 Document Revised: 05/24/2017 Document Reviewed:  05/21/2016 Elsevier Patient Education  2020 [employer].

## 2019-01-16 ENCOUNTER — Other Ambulatory Visit: Payer: Self-pay | Admitting: *Deleted

## 2019-01-16 DIAGNOSIS — R911 Solitary pulmonary nodule: Secondary | ICD-10-CM

## 2019-01-16 NOTE — Progress Notes (Signed)
[facility], [place] [address] Phone: [phone] Fax: [phone]    Your procedure is scheduled on Tuesday, July 28th.  Report to [facility] Main Entrance "A" at 5:30 A.M., and check in at the Admitting office.  Call this number if you have problems the morning of surgery:  [phone]  Call [phone] if you have any questions prior to your surgery date Monday-Friday 8am-4pm   Remember:  Do not eat or drink after midnight the night before your surgery   Take these medicines the morning of surgery with A SIP OF WATER  rosuvastatin (CRESTOR)   7 days prior to surgery STOP taking any Aspirin (unless otherwise instructed by your surgeon), Aleve, Naproxen, Ibuprofen, Motrin, Advil, Goody's, BC's, all herbal medications, fish oil, and all vitamins.   The Morning of Surgery  Do not wear jewelry, make-up or nail polish.  Do not wear lotions, powders, or perfumes/colognes, or deodorant  Do not shave 48 hours prior to surgery.  Men may shave face and neck.  Do not bring valuables to the hospital.  [facility] is not responsible for any belongings or valuables.  If you are a smoker, DO NOT Smoke 24 hours prior to surgery IF you wear a CPAP at night please bring your mask, tubing, and machine the morning of surgery   Remember that you must have someone to transport you home after your surgery, and remain with you for 24 hours if you are discharged the same day.  Contacts, glasses, hearing aids, dentures or bridgework may not be worn into surgery.   Leave your suitcase in the car.  After surgery it may be brought to your room.  For patients admitted to the hospital, discharge time will be determined by your treatment team.  Patients discharged the day of surgery will not be allowed to drive home.   Special instructions:   [facility]- Preparing For Surgery  Before surgery, you can play an  important role. Because skin is not sterile, your skin needs to be as free of germs as possible. You can reduce the number of germs on your skin by washing with CHG (chlorahexidine gluconate) Soap before surgery.  CHG is an antiseptic cleaner which kills germs and bonds with the skin to continue killing germs even after washing.    Oral Hygiene is also important to reduce your risk of infection.  Remember - BRUSH YOUR TEETH THE MORNING OF SURGERY WITH YOUR REGULAR TOOTHPASTE  Please do not use if you have an allergy to CHG or antibacterial soaps. If your skin becomes reddened/irritated stop using the CHG.  Do not shave (including legs and underarms) for at least 48 hours prior to first CHG shower. It is OK to shave your face.  Please follow these instructions carefully.   1. Shower the NIGHT BEFORE SURGERY and the MORNING OF SURGERY with CHG Soap.   2. If you chose to wash your hair, wash your hair first as usual with your normal shampoo.  3. After you shampoo, rinse your hair and body thoroughly to remove the shampoo.  4. Use CHG as you would any other liquid soap. You can apply CHG directly to the skin and wash gently with a scrungie or a clean washcloth.   5. Apply the CHG Soap to your body ONLY FROM THE NECK DOWN.  Do not use on open wounds or open sores. Avoid contact with your eyes, ears,  mouth and genitals (private parts). Wash Face and genitals (private parts)  with your normal soap.   6. Wash thoroughly, paying special attention to the area where your surgery will be performed.  7. Thoroughly rinse your body with warm water from the neck down.  8. DO NOT shower/wash with your normal soap after using and rinsing off the CHG Soap.  9. Pat yourself dry with a CLEAN TOWEL.  10. Wear CLEAN PAJAMAS to bed the night before surgery, wear comfortable clothes the morning of surgery  11. Place CLEAN SHEETS on your bed the night of your first shower and DO NOT SLEEP WITH PETS.  Day of  Surgery:  Do not apply any deodorants/lotions. Please shower the morning of surgery with the CHG soap  Please wear clean clothes to the hospital/surgery center.   Remember to brush your teeth WITH YOUR REGULAR TOOTHPASTE.  Please read over the following fact sheets that you were given.

## 2019-01-17 ENCOUNTER — Other Ambulatory Visit (HOSPITAL_COMMUNITY)
Admission: RE | Admit: 2019-01-17 | Discharge: 2019-01-17 | Disposition: A | Discharge status: Home / Self Care | Payer: Medicare Other | Source: Ambulatory Visit | Attending: Cardiothoracic Surgery | Admitting: Cardiothoracic Surgery

## 2019-01-17 DIAGNOSIS — R911 Solitary pulmonary nodule: Secondary | ICD-10-CM

## 2019-01-17 DIAGNOSIS — Z1159 Encounter for screening for other viral diseases: Secondary | ICD-10-CM | POA: Insufficient documentation

## 2019-01-17 LAB — SARS CORONAVIRUS 2 (TAT 6-24 HRS): SARS Coronavirus 2: NEGATIVE

## 2019-01-19 ENCOUNTER — Encounter (HOSPITAL_COMMUNITY): Payer: Self-pay

## 2019-01-19 ENCOUNTER — Encounter (HOSPITAL_COMMUNITY)
Admission: RE | Admit: 2019-01-19 | Discharge: 2019-01-19 | Disposition: A | Discharge status: Home / Self Care | Payer: Medicare Other | Source: Ambulatory Visit | Attending: Cardiothoracic Surgery | Admitting: Cardiothoracic Surgery

## 2019-01-19 ENCOUNTER — Ambulatory Visit (HOSPITAL_COMMUNITY)
Admission: RE | Admit: 2019-01-19 | Discharge: 2019-01-19 | Disposition: A | Discharge status: Home / Self Care | Payer: Medicare Other | Source: Ambulatory Visit | Attending: Cardiothoracic Surgery | Admitting: Cardiothoracic Surgery

## 2019-01-19 ENCOUNTER — Other Ambulatory Visit: Payer: Self-pay

## 2019-01-19 DIAGNOSIS — R911 Solitary pulmonary nodule: Secondary | ICD-10-CM | POA: Insufficient documentation

## 2019-01-19 HISTORY — DX: Other specified postprocedural states: Z98.890

## 2019-01-19 HISTORY — DX: Nausea with vomiting, unspecified: R11.2

## 2019-01-19 HISTORY — DX: Hyperlipidemia, unspecified: E78.5

## 2019-01-19 HISTORY — DX: Unspecified osteoarthritis, unspecified site: M19.90

## 2019-01-19 LAB — CBC
HCT: 43.4 % (ref 39.0–52.0)
Hemoglobin: 14.1 g/dL (ref 13.0–17.0)
MCH: 31.1 pg (ref 26.0–34.0)
MCHC: 32.5 g/dL (ref 30.0–36.0)
MCV: 95.8 fL (ref 80.0–100.0)
Platelets: 180 10*3/uL (ref 150–400)
RBC: 4.53 MIL/uL (ref 4.22–5.81)
RDW: 12.3 % (ref 11.5–15.5)
WBC: 3.5 10*3/uL — ABNORMAL LOW (ref 4.0–10.5)
nRBC: 0 % (ref 0.0–0.2)

## 2019-01-19 LAB — PROTIME-INR
INR: 1 (ref 0.8–1.2)
Prothrombin Time: 13.2 seconds (ref 11.4–15.2)

## 2019-01-19 LAB — URINALYSIS, ROUTINE W REFLEX MICROSCOPIC
Bilirubin Urine: NEGATIVE
Glucose, UA: NEGATIVE mg/dL
Hgb urine dipstick: NEGATIVE
Ketones, ur: NEGATIVE mg/dL
Leukocytes,Ua: NEGATIVE
Nitrite: NEGATIVE
Protein, ur: NEGATIVE mg/dL
Specific Gravity, Urine: 1.023 (ref 1.005–1.030)
pH: 6 (ref 5.0–8.0)

## 2019-01-19 LAB — BLOOD GAS, ARTERIAL
Acid-Base Excess: 2.4 mmol/L — ABNORMAL HIGH (ref 0.0–2.0)
Bicarbonate: 26.3 mmol/L (ref 20.0–28.0)
Drawn by: 470591
FIO2: 21
O2 Saturation: 98.6 %
Patient temperature: 98.6
pCO2 arterial: 40.1 mmHg (ref 32.0–48.0)
pH, Arterial: 7.432 (ref 7.350–7.450)
pO2, Arterial: 120 mmHg — ABNORMAL HIGH (ref 83.0–108.0)

## 2019-01-19 LAB — COMPREHENSIVE METABOLIC PANEL
ALT: 21 U/L (ref 0–44)
AST: 18 U/L (ref 15–41)
Albumin: 3.9 g/dL (ref 3.5–5.0)
Alkaline Phosphatase: 52 U/L (ref 38–126)
Anion gap: 10 (ref 5–15)
BUN: 19 mg/dL (ref 8–23)
CO2: 21 mmol/L — ABNORMAL LOW (ref 22–32)
Calcium: 9 mg/dL (ref 8.9–10.3)
Chloride: 107 mmol/L (ref 98–111)
Creatinine, Ser: 0.84 mg/dL (ref 0.61–1.24)
GFR calc Af Amer: >60 mL/min (ref >60)
GFR calc non Af Amer: >60 mL/min (ref >60)
Glucose, Bld: 126 mg/dL — ABNORMAL HIGH (ref 70–99)
Potassium: 4 mmol/L (ref 3.5–5.1)
Sodium: 138 mmol/L (ref 135–145)
Total Bilirubin: 1 mg/dL (ref 0.3–1.2)
Total Protein: 6.2 g/dL — ABNORMAL LOW (ref 6.5–8.1)

## 2019-01-19 LAB — SURGICAL PCR SCREEN
MRSA, PCR: NEGATIVE
Staphylococcus aureus: NEGATIVE

## 2019-01-19 LAB — APTT: aPTT: 28 seconds (ref 24–36)

## 2019-01-19 LAB — ABO/RH: ABO/RH(D): A POS

## 2019-01-19 NOTE — Progress Notes (Signed)
Patient denies shortness of breath, fever, cough and chest pain at PAT appointment.  PCP - Dr [name] Cardiologist - Denies Gastro-Dr Scarlette Shorts  Chest x-ray - 01/19/19 EKG - 01/14/19 Stress Test - Denies ECHO - Denies Cardiac Cath - Denies  Sleep Study - yes - location name unknown, years ago, unable to obtain records.  Faxed to respiratory. CPAP - Uses CPAP nightly  Anesthesia review: No  Coronavirus Screening Have you or Marlowe Kays experienced the following symptoms:  Cough yes/no: No Fever (>100.101F)  yes/no: No Runny nose yes/no: No Sore throat yes/no: No Difficulty breathing/shortness of breath  yes/no: No  Have you or Marlowe Kays traveled in the last 14 days and where? yes/no: No

## 2019-01-19 NOTE — Progress Notes (Signed)
Anesthesia Chart Review:  Case: [id number] Date/Time: 01/20/19 0715   Procedures:      VIDEO BRONCHOSCOPY (N/A )     VIDEO ASSISTED THORACOSCOPY (VATS)/LUNG RESECTION (Right Chest)   Anesthesia type: General   Pre-op diagnosis: RLL LUNG NODULE   Location: [facility] OR ROOM 14 / [place] [facility]   Surgeon: Grace Isaac, MD      DISCUSSION: Patient is a 68 year old male scheduled for the above procedure.  He had an incidental right lower lobe lung lesion found on CT for coronary calcium scoring. SUV was suspicious for low-grade malignancy.  Seen by CT surgery for diagnostic options with decision to proceed with above surgery.   Other history includes never smoker, GERD, OSA (CPAP use), hyperlipidemia, post-operative N/V.  Preoperative cardiology evaluation by Dr. Percival Spanish on 01/14/19 states, "PREOP CARDIOVASCULAR: The patient has a very high functional level.  He is going for a low risk procedure even if he were to have wedge resection.  He has no symptoms.  He has no high risk cardiovascular findings.  Given this and according to ACC/AHA guidelines no further cardiovascular testing is indicated."  01/17/2019 presurgical COVID-19 test negative.  If no acute changes I would anticipate that he could proceed as planned.   VS: BP 127/81   Pulse 63   Temp 36.6 C (Oral)   Resp 20   Ht 5\' 10"  (1.778 m)   Wt 90.9 kg   SpO2 100%   BMI 28.77 kg/m    PROVIDERS: [name], MD is PCP   Minus Breeding, MD is cardiologist. See on 01/14/19 for pre-operative evaluation given elevated coronary calcium score. No pre-operative testing recommended. He calculated MESA score of 7.7%. Patient very active. No further screening indicated at this time, but could consider ETT in a few years--"certainly in the future if he gets any chest discomfort we would need to see him." Follow-up in a few years recommended.   LABS: Labs reviewed: Acceptable for surgery. (all labs ordered are listed, but only abnormal results are  displayed)  Labs Reviewed  BLOOD GAS, ARTERIAL - Abnormal; Notable for the following components:      Result Value   pO2, Arterial 120 (*)    Acid-Base Excess 2.4 (*)    All other components within normal limits  CBC - Abnormal; Notable for the following components:   WBC 3.5 (*)    All other components within normal limits  COMPREHENSIVE METABOLIC PANEL - Abnormal; Notable for the following components:   CO2 21 (*)    Glucose, Bld 126 (*)    Total Protein 6.2 (*)    All other components within normal limits  SURGICAL PCR SCREEN  APTT  PROTIME-INR  URINALYSIS, ROUTINE W REFLEX MICROSCOPIC  TYPE AND SCREEN  ABO/RH    PFTs 01/14/19: FVC 4.01 (88%), post 4.04 (89%). FEV1 3.40 (101%), post 3.54 (105%). DLCO unc  25.14 (95%).   IMAGES: CXR 01/19/19: IMPRESSION: Previously identified RIGHT lower lobe nodule is suboptimally visualized radiographically, question 18 x 19 mm.  PET Scan 12/25/18: IMPRESSION: 1. The right lower lobe peribronchovascular pulmonary nodule has maximum SUV of 2.9, suspicious for low-grade malignancy. No adenopathy identified. 2. Subtle accentuated activity posterolaterally in the right fifth rib. No underlying CT abnormality is identified. Most likely this is incidental or due to occult trauma. Surveillance of the right posterolateral fifth rib on any follow up studies is suggested. 3. Levoconvex lumbar scoliosis. 4.  Aortic Atherosclerosis (ICD10-I70.0).  Coronary atherosclerosis.  CT chest 12/23/18: IMPRESSION: 16  x 11 mm nodule is noted in right lower lobe concerning for malignancy. Further evaluation with PET scan is recommended. These results will be called to the ordering clinician or representative by the Radiologist Assistant, and communication documented in the PACS or zVision Dashboard. Aortic Atherosclerosis (ICD10-I70.0).   EKG: 01/14/19: NSR. LAD.   CV: CT Heart for calcium scoring 12/16/18: IMPRESSION: 1. Coronary calcium score is  87.9 and this is at percentile 48 for patients of the same age, gender and ethnicity. 2. Irregular lobulated lesion in the right lower lobe measuring up to 1.7 cm. This lesion is not well characterized on this noncontrast examination but concerning for a pulmonary neoplasm. Recommend further characterization with a chest CT with IV contrast. 3.  Aortic Atherosclerosis (ICD10-I70.0).   Past Medical History:  Diagnosis Date  . Arthritis    knee, no meds  . Eczema 08/08/2010  . GERD (gastroesophageal reflux disease)    weight loss resolved gerd, no medication  . Hyperlipidemia   . PONV (postoperative nausea and vomiting)   . Sleep apnea    uses CPAP nightly    Past Surgical History:  Procedure Laterality Date  . COLONOSCOPY     hx polyps  . EYE SURGERY     lasik  . KNEE ARTHROSCOPY Right   . SEPTOPLASTY  1980   x 2  . TONSILLECTOMY AND ADENOIDECTOMY  1960    MEDICATIONS: . rosuvastatin (CRESTOR) 10 MG tablet   . 0.9 %  sodium chloride infusion    Myra Gianotti, PA-C Surgical Short Stay/Anesthesiology [facility] Phone [phone] [facility] Phone [phone] 01/19/2019 3:09 PM

## 2019-01-19 NOTE — Anesthesia Preprocedure Evaluation (Addendum)
Anesthesia Evaluation  Patient identified by MRN, date of birth, ID band Patient awake    Reviewed: Allergy & Precautions, NPO status , Patient's Chart, lab work & pertinent test results  History of Anesthesia Complications (+) PONV and history of anesthetic complications  Airway Mallampati: III  TM Distance: <3 FB Neck ROM: Full    Dental  (+) Teeth Intact,    Pulmonary sleep apnea and Continuous Positive Airway Pressure Ventilation , neg recent URI,  RLL LUNG NODULE   breath sounds clear to auscultation       Cardiovascular negative cardio ROS   Rhythm:Regular     Neuro/Psych negative neurological ROS  negative psych ROS   GI/Hepatic Neg liver ROS, GERD  Controlled,  Endo/Other  negative endocrine ROS  Renal/GU negative Renal ROS     Musculoskeletal  (+) Arthritis ,   Abdominal   Peds  Hematology negative hematology ROS (+)   Anesthesia Other Findings   Reproductive/Obstetrics                             Anesthesia Physical Anesthesia Plan  ASA: II  Anesthesia Plan: General   Post-op Pain Management:    Induction: Intravenous  PONV Risk Score and Plan: 3 and Ondansetron and Dexamethasone  Airway Management Planned: Double Lumen EBT  Additional Equipment: Arterial line  Intra-op Plan:   Post-operative Plan: Extubation in OR  Informed Consent: I have reviewed the patients History and Physical, chart, labs and discussed the procedure including the risks, benefits and alternatives for the proposed anesthesia with the patient or authorized representative who has indicated his/her understanding and acceptance.     Dental advisory given  Plan Discussed with: CRNA and Surgeon  Anesthesia Plan Comments: (PAT note written 01/19/2019 by Myra Gianotti, PA-C. )       Anesthesia Quick Evaluation

## 2019-01-20 ENCOUNTER — Inpatient Hospital Stay (HOSPITAL_COMMUNITY): Payer: Medicare Other | Admitting: Vascular Surgery

## 2019-01-20 ENCOUNTER — Inpatient Hospital Stay (HOSPITAL_COMMUNITY): Payer: Medicare Other

## 2019-01-20 ENCOUNTER — Inpatient Hospital Stay (HOSPITAL_COMMUNITY)
Admission: RE | Admit: 2019-01-20 | Discharge: 2019-01-23 | DRG: 829 | Disposition: A | Discharge status: Home / Self Care | Payer: Medicare Other | Attending: Cardiothoracic Surgery | Admitting: Cardiothoracic Surgery

## 2019-01-20 ENCOUNTER — Inpatient Hospital Stay (HOSPITAL_COMMUNITY): Payer: Medicare Other | Admitting: Anesthesiology

## 2019-01-20 ENCOUNTER — Encounter (HOSPITAL_COMMUNITY): Payer: Self-pay

## 2019-01-20 ENCOUNTER — Encounter (HOSPITAL_COMMUNITY)
Admission: RE | Disposition: A | Discharge status: Home / Self Care | Payer: Self-pay | Source: Home / Self Care | Attending: Cardiothoracic Surgery

## 2019-01-20 ENCOUNTER — Other Ambulatory Visit: Payer: Self-pay

## 2019-01-20 DIAGNOSIS — K219 Gastro-esophageal reflux disease without esophagitis: Secondary | ICD-10-CM | POA: Diagnosis present

## 2019-01-20 DIAGNOSIS — D3A090 Benign carcinoid tumor of the bronchus and lung: Secondary | ICD-10-CM

## 2019-01-20 DIAGNOSIS — M79642 Pain in left hand: Secondary | ICD-10-CM | POA: Diagnosis present

## 2019-01-20 DIAGNOSIS — I454 Nonspecific intraventricular block: Secondary | ICD-10-CM | POA: Diagnosis present

## 2019-01-20 DIAGNOSIS — E785 Hyperlipidemia, unspecified: Secondary | ICD-10-CM | POA: Diagnosis present

## 2019-01-20 DIAGNOSIS — G4730 Sleep apnea, unspecified: Secondary | ICD-10-CM | POA: Diagnosis present

## 2019-01-20 DIAGNOSIS — R001 Bradycardia, unspecified: Secondary | ICD-10-CM | POA: Diagnosis present

## 2019-01-20 DIAGNOSIS — Z8249 Family history of ischemic heart disease and other diseases of the circulatory system: Secondary | ICD-10-CM

## 2019-01-20 DIAGNOSIS — D3A00 Benign carcinoid tumor of unspecified site: Secondary | ICD-10-CM | POA: Diagnosis present

## 2019-01-20 DIAGNOSIS — Z79899 Other long term (current) drug therapy: Secondary | ICD-10-CM

## 2019-01-20 DIAGNOSIS — Z4682 Encounter for fitting and adjustment of non-vascular catheter: Secondary | ICD-10-CM

## 2019-01-20 DIAGNOSIS — Z9689 Presence of other specified functional implants: Secondary | ICD-10-CM

## 2019-01-20 DIAGNOSIS — R222 Localized swelling, mass and lump, trunk: Secondary | ICD-10-CM

## 2019-01-20 DIAGNOSIS — R911 Solitary pulmonary nodule: Secondary | ICD-10-CM

## 2019-01-20 DIAGNOSIS — J9811 Atelectasis: Secondary | ICD-10-CM | POA: Diagnosis present

## 2019-01-20 DIAGNOSIS — Z9889 Other specified postprocedural states: Secondary | ICD-10-CM

## 2019-01-20 HISTORY — PX: VIDEO BRONCHOSCOPY: SHX5072

## 2019-01-20 HISTORY — PX: INTERCOSTAL NERVE BLOCK: SHX5021

## 2019-01-20 HISTORY — PX: VIDEO ASSISTED THORACOSCOPY (VATS)/WEDGE RESECTION: SHX6174

## 2019-01-20 HISTORY — DX: Benign carcinoid tumor of the bronchus and lung: D3A.090

## 2019-01-20 LAB — CREATININE, SERUM
Creatinine, Ser: 0.78 mg/dL (ref 0.61–1.24)
GFR calc Af Amer: >60 mL/min (ref >60)
GFR calc non Af Amer: >60 mL/min (ref >60)

## 2019-01-20 LAB — CBC
HCT: 40.8 % (ref 39.0–52.0)
Hemoglobin: 13.8 g/dL (ref 13.0–17.0)
MCH: 31.8 pg (ref 26.0–34.0)
MCHC: 33.8 g/dL (ref 30.0–36.0)
MCV: 94 fL (ref 80.0–100.0)
Platelets: 194 10*3/uL (ref 150–400)
RBC: 4.34 MIL/uL (ref 4.22–5.81)
RDW: 12.3 % (ref 11.5–15.5)
WBC: 12.6 10*3/uL — ABNORMAL HIGH (ref 4.0–10.5)
nRBC: 0 % (ref 0.0–0.2)

## 2019-01-20 LAB — GLUCOSE, CAPILLARY
Glucose-Capillary: 165 mg/dL — ABNORMAL HIGH (ref 70–99)
Glucose-Capillary: 176 mg/dL — ABNORMAL HIGH (ref 70–99)

## 2019-01-20 LAB — PREPARE RBC (CROSSMATCH)

## 2019-01-20 SURGERY — BRONCHOSCOPY, VIDEO-ASSISTED
Anesthesia: General | Site: Chest | Laterality: Right

## 2019-01-20 MED ORDER — TRAMADOL HCL 50 MG PO TABS
50.0000 mg | ORAL_TABLET | Freq: Four times a day (QID) | ORAL | Status: DC | PRN
  Administered 2019-01-21: 50 mg via ORAL
  Filled 2019-01-20: qty 1

## 2019-01-20 MED ORDER — SODIUM CHLORIDE 0.9% IV SOLUTION: Freq: Once | INTRAVENOUS | Status: DC

## 2019-01-20 MED ORDER — SUGAMMADEX SODIUM 200 MG/2ML IV SOLN
INTRAVENOUS | Status: DC | PRN
  Administered 2019-01-20: 200 mg via INTRAVENOUS

## 2019-01-20 MED ORDER — FENTANYL CITRATE (PF) 100 MCG/2ML IJ SOLN: 25.0000 ug | INTRAMUSCULAR | Status: DC | PRN

## 2019-01-20 MED ORDER — FENTANYL CITRATE (PF) 100 MCG/2ML IJ SOLN
INTRAMUSCULAR | Status: DC | PRN
  Administered 2019-01-20 (×2): 50 ug via INTRAVENOUS
  Administered 2019-01-20: 150 ug via INTRAVENOUS
  Administered 2019-01-20 (×3): 50 ug via INTRAVENOUS

## 2019-01-20 MED ORDER — FENTANYL 40 MCG/ML IV SOLN
INTRAVENOUS | Status: DC
  Administered 2019-01-20: 30 ug via INTRAVENOUS
  Administered 2019-01-20: 13:00:00 1000 ug via INTRAVENOUS
  Administered 2019-01-21: 0 ug via INTRAVENOUS
  Administered 2019-01-21 (×2): 15 ug via INTRAVENOUS
  Administered 2019-01-21 (×2): 0 ug via INTRAVENOUS
  Administered 2019-01-22: 15 ug via INTRAVENOUS
  Filled 2019-01-20: qty 25

## 2019-01-20 MED ORDER — BISACODYL 5 MG PO TBEC
10.0000 mg | DELAYED_RELEASE_TABLET | Freq: Every day | ORAL | Status: DC
  Administered 2019-01-21 – 2019-01-23 (×3): 10 mg via ORAL
  Filled 2019-01-20 (×3): qty 2

## 2019-01-20 MED ORDER — OXYCODONE HCL 5 MG PO TABS
5.0000 mg | ORAL_TABLET | ORAL | Status: DC | PRN
  Administered 2019-01-21 (×2): 5 mg via ORAL
  Filled 2019-01-20 (×2): qty 1

## 2019-01-20 MED ORDER — FENTANYL CITRATE (PF) 250 MCG/5ML IJ SOLN
INTRAMUSCULAR | Status: AC
  Filled 2019-01-20: qty 5

## 2019-01-20 MED ORDER — ONDANSETRON HCL 4 MG/2ML IJ SOLN
4.0000 mg | Freq: Four times a day (QID) | INTRAMUSCULAR | Status: DC | PRN
  Administered 2019-01-20 – 2019-01-22 (×6): 4 mg via INTRAVENOUS
  Filled 2019-01-20 (×7): qty 2

## 2019-01-20 MED ORDER — OXYCODONE HCL 5 MG PO TABS: 5.0000 mg | ORAL_TABLET | Freq: Once | ORAL | Status: DC | PRN

## 2019-01-20 MED ORDER — SODIUM CHLORIDE (PF) 0.9 % IJ SOLN
INTRAMUSCULAR | Status: DC | PRN
  Administered 2019-01-20: 50 mL

## 2019-01-20 MED ORDER — OXYCODONE HCL 5 MG/5ML PO SOLN: 5.0000 mg | Freq: Once | ORAL | Status: DC | PRN

## 2019-01-20 MED ORDER — CEFAZOLIN SODIUM 1 G IJ SOLR
INTRAMUSCULAR | Status: AC
  Filled 2019-01-20: qty 20

## 2019-01-20 MED ORDER — MIDAZOLAM HCL 5 MG/5ML IJ SOLN
INTRAMUSCULAR | Status: DC | PRN
  Administered 2019-01-20: 2 mg via INTRAVENOUS

## 2019-01-20 MED ORDER — SODIUM CHLORIDE 0.9% FLUSH: 9.0000 mL | INTRAVENOUS | Status: DC | PRN

## 2019-01-20 MED ORDER — PROPOFOL 10 MG/ML IV BOLUS
INTRAVENOUS | Status: AC
  Filled 2019-01-20: qty 20

## 2019-01-20 MED ORDER — ACETAMINOPHEN 160 MG/5ML PO SOLN: 1000.0000 mg | Freq: Once | ORAL | Status: DC | PRN

## 2019-01-20 MED ORDER — EPHEDRINE 5 MG/ML INJ
INTRAVENOUS | Status: AC
  Filled 2019-01-20: qty 10

## 2019-01-20 MED ORDER — CEFAZOLIN SODIUM 1 G IJ SOLR
INTRAMUSCULAR | Status: AC
  Filled 2019-01-20: qty 10

## 2019-01-20 MED ORDER — ACETAMINOPHEN 160 MG/5ML PO SOLN: 1000.0000 mg | Freq: Four times a day (QID) | ORAL | Status: DC

## 2019-01-20 MED ORDER — SCOPOLAMINE 1 MG/3DAYS TD PT72
MEDICATED_PATCH | TRANSDERMAL | Status: AC
  Filled 2019-01-20: qty 1

## 2019-01-20 MED ORDER — LIDOCAINE 2% (20 MG/ML) 5 ML SYRINGE
INTRAMUSCULAR | Status: AC
  Filled 2019-01-20: qty 5

## 2019-01-20 MED ORDER — DIPHENHYDRAMINE HCL 12.5 MG/5ML PO ELIX
12.5000 mg | ORAL_SOLUTION | Freq: Four times a day (QID) | ORAL | Status: DC | PRN
  Filled 2019-01-20: qty 5

## 2019-01-20 MED ORDER — LIDOCAINE 2% (20 MG/ML) 5 ML SYRINGE
INTRAMUSCULAR | Status: DC | PRN
  Administered 2019-01-20: 60 mg via INTRAVENOUS

## 2019-01-20 MED ORDER — MIDAZOLAM HCL 2 MG/2ML IJ SOLN
INTRAMUSCULAR | Status: AC
  Filled 2019-01-20: qty 2

## 2019-01-20 MED ORDER — SCOPOLAMINE 1 MG/3DAYS TD PT72
MEDICATED_PATCH | TRANSDERMAL | Status: DC | PRN
  Administered 2019-01-20: 1 via TRANSDERMAL

## 2019-01-20 MED ORDER — ROCURONIUM BROMIDE 10 MG/ML (PF) SYRINGE
PREFILLED_SYRINGE | INTRAVENOUS | Status: AC
  Filled 2019-01-20: qty 10

## 2019-01-20 MED ORDER — BUPIVACAINE LIPOSOME 1.3 % IJ SUSP
INTRAMUSCULAR | Status: DC | PRN
  Administered 2019-01-20: 08:00:00 50 mL

## 2019-01-20 MED ORDER — ONDANSETRON HCL 4 MG/2ML IJ SOLN
INTRAMUSCULAR | Status: DC | PRN
  Administered 2019-01-20 (×2): 4 mg via INTRAVENOUS

## 2019-01-20 MED ORDER — ACETAMINOPHEN 500 MG PO TABS: 1000.0000 mg | ORAL_TABLET | Freq: Once | ORAL | Status: DC | PRN

## 2019-01-20 MED ORDER — DEXAMETHASONE SODIUM PHOSPHATE 10 MG/ML IJ SOLN
INTRAMUSCULAR | Status: AC
  Filled 2019-01-20: qty 1

## 2019-01-20 MED ORDER — ACETAMINOPHEN 500 MG PO TABS
1000.0000 mg | ORAL_TABLET | Freq: Four times a day (QID) | ORAL | Status: DC
  Administered 2019-01-20 – 2019-01-23 (×12): 1000 mg via ORAL
  Filled 2019-01-20 (×13): qty 2

## 2019-01-20 MED ORDER — BUPIVACAINE HCL (PF) 0.5 % IJ SOLN
INTRAMUSCULAR | Status: AC
  Filled 2019-01-20: qty 30

## 2019-01-20 MED ORDER — ENOXAPARIN SODIUM 40 MG/0.4ML ~~LOC~~ SOLN
40.0000 mg | SUBCUTANEOUS | Status: DC
  Administered 2019-01-20 – 2019-01-22 (×3): 40 mg via SUBCUTANEOUS
  Filled 2019-01-20 (×3): qty 0.4

## 2019-01-20 MED ORDER — INSULIN ASPART 100 UNIT/ML ~~LOC~~ SOLN
0.0000 [IU] | SUBCUTANEOUS | Status: DC
  Administered 2019-01-20 (×2): 4 [IU] via SUBCUTANEOUS
  Administered 2019-01-21: 11:00:00 2 [IU] via SUBCUTANEOUS
  Administered 2019-01-21 (×2): 4 [IU] via SUBCUTANEOUS
  Administered 2019-01-23: 2 [IU] via SUBCUTANEOUS

## 2019-01-20 MED ORDER — DEXTROSE-NACL 5-0.45 % IV SOLN
INTRAVENOUS | Status: DC
  Administered 2019-01-20 – 2019-01-21 (×2): via INTRAVENOUS

## 2019-01-20 MED ORDER — NALOXONE HCL 0.4 MG/ML IJ SOLN
0.4000 mg | INTRAMUSCULAR | Status: DC | PRN
  Filled 2019-01-20: qty 1

## 2019-01-20 MED ORDER — DIPHENHYDRAMINE HCL 50 MG/ML IJ SOLN
12.5000 mg | Freq: Four times a day (QID) | INTRAMUSCULAR | Status: DC | PRN
  Filled 2019-01-20: qty 0.25

## 2019-01-20 MED ORDER — 0.9 % SODIUM CHLORIDE (POUR BTL) OPTIME
TOPICAL | Status: DC | PRN
  Administered 2019-01-20: 2000 mL

## 2019-01-20 MED ORDER — CEFAZOLIN SODIUM-DEXTROSE 2-4 GM/100ML-% IV SOLN
2.0000 g | INTRAVENOUS | Status: AC
  Administered 2019-01-20: 12:00:00 1 g via INTRAVENOUS
  Administered 2019-01-20: 08:00:00 2 g via INTRAVENOUS
  Filled 2019-01-20: qty 100

## 2019-01-20 MED ORDER — ROCURONIUM BROMIDE 50 MG/5ML IV SOSY
PREFILLED_SYRINGE | INTRAVENOUS | Status: DC | PRN
  Administered 2019-01-20: 80 mg via INTRAVENOUS
  Administered 2019-01-20: 50 mg via INTRAVENOUS
  Administered 2019-01-20: 20 mg via INTRAVENOUS
  Administered 2019-01-20: 50 mg via INTRAVENOUS

## 2019-01-20 MED ORDER — ACETAMINOPHEN 10 MG/ML IV SOLN
INTRAVENOUS | Status: AC
  Filled 2019-01-20: qty 100

## 2019-01-20 MED ORDER — ACETAMINOPHEN 10 MG/ML IV SOLN
INTRAVENOUS | Status: DC | PRN
  Administered 2019-01-20: 1000 mg via INTRAVENOUS

## 2019-01-20 MED ORDER — ONDANSETRON HCL 4 MG/2ML IJ SOLN
INTRAMUSCULAR | Status: AC
  Filled 2019-01-20: qty 4

## 2019-01-20 MED ORDER — ROSUVASTATIN CALCIUM 5 MG PO TABS
10.0000 mg | ORAL_TABLET | Freq: Every day | ORAL | Status: DC
  Administered 2019-01-21 – 2019-01-23 (×3): 10 mg via ORAL
  Filled 2019-01-20 (×3): qty 2

## 2019-01-20 MED ORDER — LACTATED RINGERS IV SOLN
INTRAVENOUS | Status: DC | PRN
  Administered 2019-01-20: 07:00:00 via INTRAVENOUS

## 2019-01-20 MED ORDER — CEFAZOLIN SODIUM-DEXTROSE 2-4 GM/100ML-% IV SOLN
2.0000 g | Freq: Three times a day (TID) | INTRAVENOUS | Status: AC
  Administered 2019-01-20 – 2019-01-21 (×2): 2 g via INTRAVENOUS
  Filled 2019-01-20 (×2): qty 100

## 2019-01-20 MED ORDER — ACETAMINOPHEN 10 MG/ML IV SOLN: 1000.0000 mg | Freq: Once | INTRAVENOUS | Status: DC | PRN

## 2019-01-20 MED ORDER — EPHEDRINE SULFATE 50 MG/ML IJ SOLN
INTRAMUSCULAR | Status: DC | PRN
  Administered 2019-01-20 (×2): 10 mg via INTRAVENOUS

## 2019-01-20 MED ORDER — LACTATED RINGERS IV SOLN
INTRAVENOUS | Status: DC | PRN
  Administered 2019-01-20 (×2): via INTRAVENOUS

## 2019-01-20 MED ORDER — SENNOSIDES-DOCUSATE SODIUM 8.6-50 MG PO TABS
1.0000 | ORAL_TABLET | Freq: Every day | ORAL | Status: DC
  Administered 2019-01-20 – 2019-01-22 (×3): 1 via ORAL
  Filled 2019-01-20 (×3): qty 1

## 2019-01-20 MED ORDER — PROPOFOL 10 MG/ML IV BOLUS
INTRAVENOUS | Status: DC | PRN
  Administered 2019-01-20: 140 mg via INTRAVENOUS
  Administered 2019-01-20: 20 mg via INTRAVENOUS

## 2019-01-20 SURGICAL SUPPLY — 96 items
ADAPTER VALVE BIOPSY EBUS (MISCELLANEOUS) IMPLANT
ADH SKN CLS APL DERMABOND .7 (GAUZE/BANDAGES/DRESSINGS) ×3
ADPTR VALVE BIOPSY EBUS (MISCELLANEOUS)
APL SRG 22X2 LUM MLBL SLNT (VASCULAR PRODUCTS)
APL SRG 7X2 LUM MLBL SLNT (VASCULAR PRODUCTS)
APPLICATOR TIP COSEAL (VASCULAR PRODUCTS) IMPLANT
APPLICATOR TIP EXT COSEAL (VASCULAR PRODUCTS) IMPLANT
BRUSH CYTOL CELLEBRITY 1.5X140 (MISCELLANEOUS) IMPLANT
CANISTER SUCT 3000ML PPV (MISCELLANEOUS) ×4 IMPLANT
CATH THORACIC 28FR (CATHETERS) IMPLANT
CATH THORACIC 36FR (CATHETERS) IMPLANT
CATH THORACIC 36FR RT ANG (CATHETERS) IMPLANT
CLIP VESOCCLUDE MED 6/CT (CLIP) ×4 IMPLANT
CONN ST 1/4X3/8  BEN (MISCELLANEOUS)
CONN ST 1/4X3/8 BEN (MISCELLANEOUS) IMPLANT
CONT SPEC 4OZ CLIKSEAL STRL BL (MISCELLANEOUS) ×10 IMPLANT
COVER BACK TABLE 60X90IN (DRAPES) ×4 IMPLANT
COVER WAND RF STERILE (DRAPES) ×3 IMPLANT
DERMABOND ADVANCED (GAUZE/BANDAGES/DRESSINGS) ×1
DERMABOND ADVANCED .7 DNX12 (GAUZE/BANDAGES/DRESSINGS) IMPLANT
DISSECTOR BLUNT TIP ENDO 5MM (MISCELLANEOUS) ×1 IMPLANT
DRAIN CHANNEL 28F RND 3/8 FF (WOUND CARE) IMPLANT
DRAIN CHANNEL 32F RND 10.7 FF (WOUND CARE) IMPLANT
DRAPE LAPAROSCOPIC ABDOMINAL (DRAPES) ×4 IMPLANT
ELECT BLADE 4.0 EZ CLEAN MEGAD (MISCELLANEOUS) ×4
ELECT BLADE 6.5 EXT (BLADE) ×4 IMPLANT
ELECT REM PT RETURN 9FT ADLT (ELECTROSURGICAL) ×4
ELECTRODE BLDE 4.0 EZ CLN MEGD (MISCELLANEOUS) ×3 IMPLANT
ELECTRODE REM PT RTRN 9FT ADLT (ELECTROSURGICAL) ×3 IMPLANT
FELT TEFLON 1X6 (MISCELLANEOUS) ×1 IMPLANT
FORCEPS BIOP RJ4 1.8 (CUTTING FORCEPS) IMPLANT
GAUZE SPONGE 4X4 12PLY STRL (GAUZE/BANDAGES/DRESSINGS) ×4 IMPLANT
GLOVE BIO SURGEON STRL SZ 6.5 (GLOVE) ×8 IMPLANT
GOWN STRL REUS W/ TWL LRG LVL3 (GOWN DISPOSABLE) ×12 IMPLANT
GOWN STRL REUS W/TWL LRG LVL3 (GOWN DISPOSABLE) ×16
KIT BASIN OR (CUSTOM PROCEDURE TRAY) ×4 IMPLANT
KIT CLEAN ENDO COMPLIANCE (KITS) ×4 IMPLANT
KIT SUCTION CATH 14FR (SUCTIONS) ×4 IMPLANT
KIT TURNOVER KIT B (KITS) ×4 IMPLANT
MARKER SKIN DUAL TIP RULER LAB (MISCELLANEOUS) IMPLANT
NDL SPNL 18GX3.5 QUINCKE PK (NEEDLE) ×3 IMPLANT
NEEDLE SPNL 18GX3.5 QUINCKE PK (NEEDLE) ×4 IMPLANT
NS IRRIG 1000ML POUR BTL (IV SOLUTION) ×8 IMPLANT
OIL SILICONE PENTAX (PARTS (SERVICE/REPAIRS)) ×4 IMPLANT
PACK CHEST (CUSTOM PROCEDURE TRAY) ×4 IMPLANT
PAD ARMBOARD 7.5X6 YLW CONV (MISCELLANEOUS) ×11 IMPLANT
PASSER SUT SWANSON 36MM LOOP (INSTRUMENTS) ×1 IMPLANT
RELOAD EGIA 45 TAN VASC (STAPLE) ×2 IMPLANT
SCISSORS LAP 5X35 DISP (ENDOMECHANICALS) IMPLANT
SEALANT SURG COSEAL 4ML (VASCULAR PRODUCTS) IMPLANT
SEALANT SURG COSEAL 8ML (VASCULAR PRODUCTS) IMPLANT
SEALER LIGASURE MARYLAND 30 (ELECTROSURGICAL) ×1 IMPLANT
SHEARS HARMONIC HDI 20CM (ELECTROSURGICAL) ×1 IMPLANT
SOLUTION ANTI FOG 6CC (MISCELLANEOUS) ×4 IMPLANT
SPONGE INTESTINAL PEANUT (DISPOSABLE) ×1 IMPLANT
SPONGE TONSIL TAPE 1 RFD (DISPOSABLE) ×1 IMPLANT
STAPLER ECHELON POWERED (MISCELLANEOUS) IMPLANT
STAPLER ENDO GIA 12 SHRT THIN (STAPLE) IMPLANT
STAPLER ENDO GIA 12MM SHORT (STAPLE) ×8 IMPLANT
STOPCOCK 4 WAY LG BORE MALE ST (IV SETS) ×4 IMPLANT
SUT PROLENE 3 0 SH DA (SUTURE) IMPLANT
SUT PROLENE 4 0 RB 1 (SUTURE) ×8
SUT PROLENE 4-0 RB1 .5 CRCL 36 (SUTURE) IMPLANT
SUT SILK  1 MH (SUTURE) ×2
SUT SILK 1 MH (SUTURE) ×12 IMPLANT
SUT SILK 1 TIES 10X30 (SUTURE) IMPLANT
SUT SILK 2 0 SH (SUTURE) IMPLANT
SUT SILK 2 0SH CR/8 30 (SUTURE) IMPLANT
SUT STEEL 1 (SUTURE) IMPLANT
SUT VIC AB 0 CTX 18 (SUTURE) ×3 IMPLANT
SUT VIC AB 1 CTX 18 (SUTURE) ×1 IMPLANT
SUT VIC AB 1 CTX 36 (SUTURE)
SUT VIC AB 1 CTX36XBRD ANBCTR (SUTURE) IMPLANT
SUT VIC AB 2-0 CTX 36 (SUTURE) ×1 IMPLANT
SUT VIC AB 3-0 SH 8-18 (SUTURE) IMPLANT
SUT VIC AB 3-0 X1 27 (SUTURE) IMPLANT
SUT VICRYL 0 UR6 27IN ABS (SUTURE) IMPLANT
SUT VICRYL 2 TP 1 (SUTURE) ×1 IMPLANT
SYR 20ML ECCENTRIC (SYRINGE) ×4 IMPLANT
SYR 50ML LL SCALE MARK (SYRINGE) ×4 IMPLANT
SYR 5ML LL (SYRINGE) ×4 IMPLANT
SYSTEM SAHARA CHEST DRAIN ATS (WOUND CARE) ×4 IMPLANT
TAPE CLOTH SURG 4X10 WHT LF (GAUZE/BANDAGES/DRESSINGS) ×1 IMPLANT
TAPE UMBILICAL COTTON 1/8X30 (MISCELLANEOUS) IMPLANT
TOWEL GREEN STERILE (TOWEL DISPOSABLE) ×4 IMPLANT
TOWEL GREEN STERILE FF (TOWEL DISPOSABLE) ×4 IMPLANT
TRAP SPECIMEN MUCOUS 40CC (MISCELLANEOUS) IMPLANT
TRAY FOLEY MTR SLVR 16FR STAT (SET/KITS/TRAYS/PACK) ×4 IMPLANT
TROCAR BLADELESS 12MM (ENDOMECHANICALS) ×1 IMPLANT
TROCAR XCEL 12X100 BLDLESS (ENDOMECHANICALS) IMPLANT
TUBE CONNECTING 20X1/4 (TUBING) ×4 IMPLANT
TUBING EXTENTION W/L.L. (IV SETS) ×4 IMPLANT
VALVE BIOPSY  SINGLE USE (MISCELLANEOUS) ×1
VALVE BIOPSY SINGLE USE (MISCELLANEOUS) ×3 IMPLANT
VALVE SUCTION BRONCHIO DISP (MISCELLANEOUS) ×4 IMPLANT
WATER STERILE IRR 1000ML POUR (IV SOLUTION) ×5 IMPLANT

## 2019-01-20 NOTE — Transfer of Care (Signed)
Immediate Anesthesia Transfer of Care Note  Patient: [name]  Procedure(s) Performed: VIDEO BRONCHOSCOPY (N/A ) VIDEO ASSISTED THORACOSCOPY (VATS) WITH RESECTION OF RIGHT INTRAPULMONARY LYMPH NODE (Right Chest) Intercostal Nerve Block  Patient Location: PACU  Anesthesia Type:General  Level of Consciousness: awake, alert , oriented and patient cooperative  Airway & Oxygen Therapy: Patient Spontanous Breathing  Post-op Assessment: Report given to RN and Post -op Vital signs reviewed and stable  Post vital signs: Reviewed and stable  Last Vitals:  Vitals Value Taken Time  BP 124/64 01/20/19 1213  Temp    Pulse 62 01/20/19 1221  Resp 24 01/20/19 1221  SpO2 92 % 01/20/19 1221  Vitals shown include unvalidated device data.  Last Pain:  Vitals:   01/20/19 0621  TempSrc:   PainSc: 0-No pain      Patients Stated Pain Goal: 3 (74/12/87 8676)  Complications: No apparent anesthesia complications

## 2019-01-20 NOTE — Anesthesia Procedure Notes (Signed)
Arterial Line Insertion Start/End7/28/2020 7:12 AM, 01/20/2019 7:20 AM Performed by: Laretta Alstrom, CRNA, CRNA  Patient location: Pre-op. Preanesthetic checklist: patient identified, IV checked, site marked, risks and benefits discussed, surgical consent, monitors and equipment checked, pre-op evaluation, timeout performed and anesthesia consent Lidocaine 1% used for infiltration Left, radial was placed Catheter size: 20 G Maximum sterile barriers used   Attempts: 1 Procedure performed without using ultrasound guided technique. Following insertion, dressing applied and Biopatch. Patient tolerated the procedure well with no immediate complications.

## 2019-01-20 NOTE — H&P (Signed)
[address]       [name] [address]             [phone]                    [name] [place] Medical Record #[id number] Date of Birth: 04/30/1951  Referring: [name], MD Primary Care: [name], MD Primary Cardiologist: Minus Breeding, MD    History of Present Illness:      [name] 68 y.o. male is seen in the office  today for for incidental finding of a 16 x 9 mm lung mass in the right lower lobe.  The patient is a lifelong non-smoker.  Recently had a calcium score CT done of the heart which noted an incidental finding subsequently a CT of the chest and PET scan have been done confirming a very mildly avid right lower lobe lung nodule.  Patient has no pulmonary symptoms.  Patient has no history of trauma and no symptoms referable to the right rib.   Is retired Media planner  , notes that he is never been employed or worked Architect or around potential exposures to asbestos.     Current Activity/ Functional Status:  Patient is independent with mobility/ambulation, transfers, ADL's, IADL's.   Zubrod Score: At the time of surgery this patients most appropriate activity status/level should be described as: [x]     0    Normal activity, no symptoms []     1    Restricted in physical strenuous activity but ambulatory, able to do out light work []     2    Ambulatory and capable of self care, unable to do work activities, up and about               >50 % of waking hours                              []     3    Only limited self care, in bed greater than 50% of waking hours []     4    Completely disabled, no self care, confined to bed or chair []     5    Moribund   Past Medical History:  Diagnosis Date   Arthritis    knee, no meds   Eczema 08/08/2010   GERD (gastroesophageal reflux disease)    weight loss resolved gerd, no medication   Hyperlipidemia    PONV (postoperative nausea and vomiting)    Sleep apnea     uses CPAP nightly    Past Surgical History:  Procedure Laterality Date   COLONOSCOPY     hx polyps   EYE SURGERY     lasik   KNEE ARTHROSCOPY Right    SEPTOPLASTY  1980   x 2   TONSILLECTOMY AND ADENOIDECTOMY  1960    Family History  Problem Relation Age of Onset   Heart disease Mother        CABG age 19s   Colon cancer Neg Hx    Stomach cancer Neg Hx      Social History   Tobacco Use  Smoking Status Never Smoker  Smokeless Tobacco Never Used    Social History   Substance and Sexual Activity  Alcohol Use Yes   Alcohol/week: 10.0 standard drinks   Types: 10 Glasses of wine per week     Allergies  Allergen  Reactions   Codeine     headache    Current Facility-Administered Medications  Medication Dose Route Frequency Provider Last Rate Last Dose   ceFAZolin (ANCEF) IVPB 2g/100 mL premix  2 g Intravenous 30 min Pre-Op Grace Isaac, MD        Pertinent items are noted in HPI.   Review of Systems:     Cardiac Review of Systems: [Y] = yes  or   [ N ] = no   Chest Pain [ n   ]  Resting SOB [  n ] Exertional SOB  [ n ]  Orthopnea [ n ]   Pedal Edema [ n  ]    Palpitations [n  ] Syncope  [ n ]   Presyncope [ n ]   General Review of Systems: [Y] = yes [  ]=no Constitional: recent weight change [  ];  Wt loss over the last 3 months [   ] anorexia [  ]; fatigue [  ]; nausea [  ]; night sweats [  ]; fever [  ]; or chills [  ];           Eye : blurred vision [  ]; diplopia [   ]; vision changes [  ];  Amaurosis fugax[  ]; Resp: cough [  ];  wheezing[  ];  hemoptysis[  ]; shortness of breath[  ]; paroxysmal nocturnal dyspnea[  ]; dyspnea on exertion[  ]; or orthopnea[  ];  GI:  gallstones[  ], vomiting[  ];  dysphagia[  ]; melena[  ];  hematochezia [  ]; heartburn[  ];   Hx of  Colonoscopy[  ]; GU: kidney stones [  ]; hematuria[  ];   dysuria [  ];  nocturia[  ];  history of     obstruction [  ]; urinary frequency [  ]             Skin: rash,  swelling[  ];, hair loss[  ];  peripheral edema[  ];  or itching[  ]; Musculosketetal: myalgias[  ];  joint swelling[  ];  joint erythema[  ];  joint pain[  ];  back pain[  ];  Heme/Lymph: bruising[  ];  bleeding[  ];  anemia[  ];  Neuro: TIA[  ];  headaches[  ];  stroke[  ];  vertigo[  ];  seizures[  ];   paresthesias[  ];  difficulty walking[  ];  Psych:depression[  ]; anxiety[  ];  Endocrine: diabetes[  ];  thyroid dysfunction[  ];  Immunizations: Flu up to date [  ]; Pneumococcal up to date [  ];  Other:     PHYSICAL EXAMINATION: BP 136/73    Pulse 72    Temp 98.9 F (37.2 C) (Oral)    Resp 20    SpO2 97%  General appearance: alert, cooperative, appears stated age and no distress Head: Normocephalic, without obvious abnormality, atraumatic Neck: no adenopathy, no carotid bruit, no JVD, supple, symmetrical, trachea midline and thyroid not enlarged, symmetric, no tenderness/mass/nodules Lymph nodes: Cervical, supraclavicular, and axillary nodes normal. Resp: clear to auscultation bilaterally Cardio: regular rate and rhythm, S1, S2 normal, no murmur, click, rub or gallop GI: soft, non-tender; bowel sounds normal; no masses,  no organomegaly Extremities: extremities normal, atraumatic, no cyanosis or edema Neurologic: Grossly normal  Diagnostic Studies & Laboratory data:     Recent Radiology Findings:   Ct Chest W Contrast  Result Date: 12/23/2018 CLINICAL DATA:  Possible lung mass. EXAM: CT CHEST WITH CONTRAST TECHNIQUE: Multidetector CT imaging of the chest was performed during intravenous contrast administration. CONTRAST:  71mL ISOVUE-300 IOPAMIDOL (ISOVUE-300) INJECTION 61% COMPARISON:  CT scan of December 16, 2018. FINDINGS: Cardiovascular: Atherosclerosis of thoracic aorta is noted without aneurysm or dissection. Normal cardiac size. No pericardial effusion. Mediastinum/Nodes: No enlarged mediastinal, hilar, or axillary lymph nodes. Thyroid gland, trachea, and esophagus demonstrate  no significant findings. Lungs/Pleura: No pneumothorax or pleural effusion is noted. Left lung is clear. 16 x 11 mm mass is noted in the right lower lobe best seen on image number 119 of series 4. Upper Abdomen: No acute abnormality. Musculoskeletal: No chest wall abnormality. No acute or significant osseous findings. IMPRESSION: 16 x 11 mm nodule is noted in right lower lobe concerning for malignancy. Further evaluation with PET scan is recommended. These results will be called to the ordering clinician or representative by the Radiologist Assistant, and communication documented in the PACS or zVision Dashboard. Aortic Atherosclerosis (ICD10-I70.0). Electronically Signed   By: Marijo Conception M.D.   On: 12/23/2018 14:22   Ct Cardiac Scoring  Result Date: 12/16/2018 CLINICAL DATA:  68 year old white male with hyperlipidemia. EXAM: CT HEART FOR CALCIUM SCORING TECHNIQUE: CT heart was performed using prospective ECG gating. A non-contrast exam for calcium scoring was performed. Note that this exam targets the heart and the chest was not imaged in its entirety. COMPARISON:  None. FINDINGS: Technical quality: Good. CORONARY CALCIUM Total Agatston Score: 87.9 MESA database percentile:  48 OTHER FINDINGS: Cardiovascular: Small amount of coronary calcium in the LAD, left circumflex and right coronary artery. Atherosclerotic calcifications in the thoracic aorta without aneurysm. No significant pericardial fluid. Mediastinum/Nodes: Question small hiatal hernia. No significant mediastinal lymphadenopathy. Limited evaluation for hilar lymphadenopathy on this non contrast examination. Lungs/Pleura: No large pleural effusions. Lobulated lesion in the anterior aspect of the right lower lobe on sequence 9, image 34. Difficult to accurately evaluate this lesion due to the adjacent vasculature and the lack of intravenous contrast. The lesion roughly measures 1.6 x 1.0 x 1.7 cm. Not clear if this represents a single lesion or  small cluster of nodules. No other suspicious pulmonary lesions or nodules. Upper Abdomen: Images of the upper abdomen are unremarkable. Musculoskeletal: No acute abnormality. Mild dextroscoliosis in the thoracic spine. IMPRESSION: 1. Coronary calcium score is 87.9 and this is at percentile 48 for patients of the same age, gender and ethnicity. 2. Irregular lobulated lesion in the right lower lobe measuring up to 1.7 cm. This lesion is not well characterized on this noncontrast examination but concerning for a pulmonary neoplasm. Recommend further characterization with a chest CT with IV contrast. 3.  Aortic Atherosclerosis (ICD10-I70.0). These results will be called to the ordering clinician or representative by the Radiologist Assistant, and communication documented in the PACS or zVision Dashboard. Electronically Signed   By: Markus Daft M.D.   On: 12/16/2018 21:59   Nm Pet Image Initial (pi) Skull Base To Thigh  Result Date: 12/25/2018 CLINICAL DATA:  Initial treatment strategy for pulmonary nodule. EXAM: NUCLEAR MEDICINE PET SKULL BASE TO THIGH TECHNIQUE: 10.9 mCi F-18 FDG was injected intravenously. Full-ring PET imaging was performed from the skull base to thigh after the radiotracer. CT data was obtained and used for attenuation correction and anatomic localization. Fasting blood glucose: Ninety-seven mg/dl COMPARISON:  CT scan dated 12/23/2018 FINDINGS: Mediastinal blood pool activity: SUV max 2.9 Liver activity: SUV max NA NECK: Symmetric physiologic activity along the tongue base.  Incidental CT findings: none CHEST: The 1.1 cm right lower lobe peribronchovascular pulmonary nodule has maximum SUV of 2.9. No pathologically enlarged or hypermetabolic adenopathy in the chest is noted. Incidental CT findings: Aortic arch and circumflex coronary artery atherosclerotic calcification. Mild gynecomastia. ABDOMEN/PELVIS: No significant abnormal hypermetabolic activity in this region. Incidental CT findings:  Aortoiliac atherosclerotic vascular disease. SKELETON: Subtle activity posterolaterally in the right fifth rib, maximum SUV 2.3 as compared to contralateral side 1.0. No definite underlying CT abnormality. Incidental CT findings: Levoconvex lumbar scoliosis with rotary component IMPRESSION: 1. The right lower lobe peribronchovascular pulmonary nodule has maximum SUV of 2.9, suspicious for low-grade malignancy. No adenopathy identified. 2. Subtle accentuated activity posterolaterally in the right fifth rib. No underlying CT abnormality is identified. Most likely this is incidental or due to occult trauma. Surveillance of the right posterolateral fifth rib on any follow up studies is suggested. 3. Levoconvex lumbar scoliosis. 4.  Aortic Atherosclerosis (ICD10-I70.0).  Coronary atherosclerosis. Electronically Signed   By: Van Clines M.D.   On: 12/25/2018 14:48     I have independently reviewed the above radiology studies  and reviewed the findings with the patient.   Recent Lab Findings: Lab Results  Component Value Date   WBC 3.5 (L) 01/19/2019   HGB 14.1 01/19/2019   HCT 43.4 01/19/2019   PLT 180 01/19/2019   GLUCOSE 126 (H) 01/19/2019   ALT 21 01/19/2019   AST 18 01/19/2019   NA 138 01/19/2019   K 4.0 01/19/2019   CL 107 01/19/2019   CREATININE 0.84 01/19/2019   BUN 19 01/19/2019   CO2 21 (L) 01/19/2019   INR 1.0 01/19/2019    PFT's FEV1 3.4 101% DLCO 25.1 95% The FVC, FEV1, FEV1/FVC ratio and FEF25-75% are within normal limits. The airway resistance is normal. Lung volumes are within normal limits. Following administration of bronchodilators, there is no significant response. The diffusing capacity is normal. However, the diffusing capacity was not corrected for the patient's hemoglobin. Conclusions: The results are within normal limits. Pulmonary Function Diagnosis: Normal Pulmonary Function  Assessment / Plan:   1/16 x 11 mm nodule is noted in right lower lobe concerning  for malignancy.  Found incidentally on calcium scoring CTmaximum SUV of 2.9, suspicious for low-grade malignancy. No adenopathy identified. 2/subtle accentuated activity posterolaterally in the right fifth rib   I discussed with the patient and his wife both the diagnostic and treatment issues involved with the suspicious right lower lobe lung nodule.  The option of proceeding directly with resection both for diagnosis and treatment versus attempt navigation bronchoscopy and biopsy and then surgical resection were discussed.  Risks and options of surgery including expectations and recovery time were reviewed.    The goals risks and alternatives of the planned surgical procedure Procedure(s): VIDEO BRONCHOSCOPY (N/A) VIDEO ASSISTED THORACOSCOPY (VATS)/LUNG RESECTION (Right)  have been discussed with the patient in detail. The risks of the procedure including death, infection, stroke, myocardial infarction, bleeding, blood transfusion have all been discussed specifically.  I have quoted Phillips Grout a 2% of perioperative mortality and a complication rate as high as 20%. The patient's questions have been answered.PRIMUS GRITTON is willing  to proceed with the planned procedure.   Grace Isaac MD      [address] [place] [address] Office [phone]   Beeper [phone]  01/20/2019 7:05 AM

## 2019-01-20 NOTE — Brief Op Note (Addendum)
01/20/2019  12:00 PM  PATIENT:  [name]  68 y.o. male  PRE-OPERATIVE DIAGNOSIS:  RIGHT LUNG mass  POST-OPERATIVE DIAGNOSIS:  RIGHT LUNG MASS  PROCEDURE:  Procedure(s): VIDEO BRONCHOSCOPY (N/A) VIDEO ASSISTED THORACOSCOPY (VATS) WITH RESECTION OF RIGHT INTRAPULMONARY LYMPH NODE (Right) Intercostal Nerve Block  SURGEON:  Surgeon(s) and Role:    * Grace Isaac, MD - Primary    * Lightfoot, Lucile Crater, MD - Assisting   ANESTHESIA:   general  EBL:  250 mL   BLOOD ADMINISTERED:none  DRAINS: routine   LOCAL MEDICATIONS USED:  BUPIVICAINE   SPECIMEN:  Source of Specimen:  right intrapulmonary lymph node  DISPOSITION OF SPECIMEN:  PATHOLOGY  COUNTS:  YES  DICTATION: .Dragon Dictation  PLAN OF CARE: Admit to inpatient   PATIENT DISPOSITION:  PACU - hemodynamically stable.   Delay start of Pharmacological VTE agent (>24hrs) due to surgical blood loss or risk of bleeding: no

## 2019-01-21 ENCOUNTER — Encounter (HOSPITAL_COMMUNITY): Payer: Self-pay | Admitting: Cardiothoracic Surgery

## 2019-01-21 ENCOUNTER — Inpatient Hospital Stay (HOSPITAL_COMMUNITY): Payer: Medicare Other

## 2019-01-21 LAB — BLOOD GAS, ARTERIAL
Acid-Base Excess: 1.9 mmol/L (ref 0.0–2.0)
Bicarbonate: 25.7 mmol/L (ref 20.0–28.0)
Drawn by: 511471
FIO2: 21
O2 Saturation: 96.2 %
Patient temperature: 98.6
pCO2 arterial: 38.8 mmHg (ref 32.0–48.0)
pH, Arterial: 7.437 (ref 7.350–7.450)
pO2, Arterial: 78 mmHg — ABNORMAL LOW (ref 83.0–108.0)

## 2019-01-21 LAB — GLUCOSE, CAPILLARY
Glucose-Capillary: 106 mg/dL — ABNORMAL HIGH (ref 70–99)
Glucose-Capillary: 116 mg/dL — ABNORMAL HIGH (ref 70–99)
Glucose-Capillary: 116 mg/dL — ABNORMAL HIGH (ref 70–99)
Glucose-Capillary: 124 mg/dL — ABNORMAL HIGH (ref 70–99)
Glucose-Capillary: 136 mg/dL — ABNORMAL HIGH (ref 70–99)
Glucose-Capillary: 175 mg/dL — ABNORMAL HIGH (ref 70–99)
Glucose-Capillary: 187 mg/dL — ABNORMAL HIGH (ref 70–99)
Glucose-Capillary: 96 mg/dL (ref 70–99)

## 2019-01-21 LAB — CBC
HCT: 37.8 % — ABNORMAL LOW (ref 39.0–52.0)
Hemoglobin: 12.4 g/dL — ABNORMAL LOW (ref 13.0–17.0)
MCH: 31.3 pg (ref 26.0–34.0)
MCHC: 32.8 g/dL (ref 30.0–36.0)
MCV: 95.5 fL (ref 80.0–100.0)
Platelets: 214 10*3/uL (ref 150–400)
RBC: 3.96 MIL/uL — ABNORMAL LOW (ref 4.22–5.81)
RDW: 12.4 % (ref 11.5–15.5)
WBC: 10.5 10*3/uL (ref 4.0–10.5)
nRBC: 0 % (ref 0.0–0.2)

## 2019-01-21 LAB — BASIC METABOLIC PANEL
Anion gap: 8 (ref 5–15)
BUN: 10 mg/dL (ref 8–23)
CO2: 25 mmol/L (ref 22–32)
Calcium: 8.3 mg/dL — ABNORMAL LOW (ref 8.9–10.3)
Chloride: 106 mmol/L (ref 98–111)
Creatinine, Ser: 0.88 mg/dL (ref 0.61–1.24)
GFR calc Af Amer: >60 mL/min (ref >60)
GFR calc non Af Amer: >60 mL/min (ref >60)
Glucose, Bld: 157 mg/dL — ABNORMAL HIGH (ref 70–99)
Potassium: 3.8 mmol/L (ref 3.5–5.1)
Sodium: 139 mmol/L (ref 135–145)

## 2019-01-21 NOTE — Discharge Instructions (Signed)

## 2019-01-21 NOTE — Progress Notes (Addendum)
      [address]       [place] [address]             [phone]      1 Day Post-Op Procedure(s) (LRB): VIDEO BRONCHOSCOPY (N/A) VIDEO ASSISTED THORACOSCOPY (VATS) WITH RESECTION OF RIGHT INTRAPULMONARY LYMPH NODE (Right) Intercostal Nerve Block Subjective: Feels good. Only complaint is shooting pain in left hand.  Objective: Vital signs in last 24 hours: Temp:  [97 F (36.1 C)-98.3 F (36.8 C)] 97.8 F (36.6 C) (07/29 0728) Pulse Rate:  [56-73] 58 (07/29 0728) Cardiac Rhythm: Normal sinus rhythm (07/29 0700) Resp:  [15-24] 20 (07/29 0728) BP: (104-134)/(57-76) 116/70 (07/29 0728) SpO2:  [94 %-99 %] 99 % (07/29 0728) Arterial Line BP: (124-139)/(54-57) 139/55 (07/28 1305)     Intake/Output from previous day: 07/28 0701 - 07/29 0700 In: 4238.8 [P.O.:1082; I.V.:2556.8; IV Piggyback:450] Out: 2594 [Urine:2160; Blood:250; Chest Tube:184] Intake/Output this shift: No intake/output data recorded.  General appearance: alert, cooperative and no distress Heart: sinus bradycardia Lungs: clear to auscultation bilaterally Abdomen: soft, non-tender; bowel sounds normal; no masses,  no organomegaly Extremities: extremities normal, atraumatic, no cyanosis or edema Wound: clean and dry  Lab Results: Recent Labs    01/20/19 1532 01/21/19 0317  WBC 12.6* 10.5  HGB 13.8 12.4*  HCT 40.8 37.8*  PLT 194 214   BMET:  Recent Labs    01/19/19 0900 01/20/19 1532 01/21/19 0317  NA 138  --  139  K 4.0  --  3.8  CL 107  --  106  CO2 21*  --  25  GLUCOSE 126*  --  157*  BUN 19  --  10  CREATININE 0.84 0.78 0.88  CALCIUM 9.0  --  8.3*    PT/INR:  Recent Labs    01/19/19 0900  LABPROT 13.2  INR 1.0   ABG    Component Value Date/Time   PHART 7.437 01/21/2019 0426   HCO3 25.7 01/21/2019 0426   O2SAT 96.2 01/21/2019 0426   CBG (last 3)  Recent Labs    01/20/19 1949 01/20/19 2348 01/21/19 0323  GLUCAP 176* 187* 175*    Assessment/Plan: S/P  Procedure(s) (LRB): VIDEO BRONCHOSCOPY (N/A) VIDEO ASSISTED THORACOSCOPY (VATS) WITH RESECTION OF RIGHT INTRAPULMONARY LYMPH NODE (Right) Intercostal Nerve Block  1. CV-sinus bradycardia rate in the 50s. BBB. Will continue to monitor closely. Avoid nodal agents.  2. Pulm-tolerating room air with good oxygen saturation. Continue to encourge incentive spirometer.Chest tube output 184cc/24 hours. CXR is stable this morning without obvious pleural effusion or pneumothorax. 3. Renal-creatinine is 0.88. Electrolytes are okay. 4. H and H 13.8/40.8, expected acute blood loss anemia.  5. Blood glucose level has been slightly elevated. Not a diabetic and not on any home medication. Continue with SSI for now.  Plan: Chest tube is on water seal for today. Will get a chest xray in the morning. Chest xray reviewed with the patient. He does complain of some nerve pain in his left hand where the radial artery line was-will monitor. Encouraging stretching and strengthening of the hand. Will order PT to assist.    LOS: 1 day    Elgie Collard 01/21/2019  Path pending, good strength in left hand, vascular intact, sensation  normal some left hand pain I have seen and examined [name] and agree with the above assessment  and plan.  Grace Isaac MD Beeper [phone] Office [phone] 01/21/2019 1:29 PM

## 2019-01-21 NOTE — Evaluation (Signed)
Physical Therapy Evaluation Patient Details Name: [name] MRN: [id number] DOB: [birth year] Today's Date: 01/21/2019   History of Present Illness  Pt is a 68 y/o male admitted secondary to incidental finding of a 16 x 9 mm lung mass in the right lower lobe. Pt is s/p VATS with resection of R intrapulmonary lymph node and chest tube insertion. PMH including but not limited to HLD and arthritis.    Clinical Impression  Pt presented sitting EOB with RN present, awake and willing to participate in therapy session. Prior to admission, pt reported that he was very independent. He is retired and enjoys Careers information officer. At the time of evaluation, pt required min A of two or mod A of one for transfers and min guard with use of RW to ambulate in hallways. PT will continue to f/u with pt to ensure he continues to progress with mobility while admitted and after chest tube removal (?potentially 7/30).     Follow Up Recommendations No PT follow up    Equipment Recommendations  Rolling walker with 5" wheels;Other (comment)(may not need, pending mobility progress after d/c CT)    Recommendations for Other Services       Precautions / Restrictions Precautions Precautions: Fall Precaution Comments: chest tube Restrictions Weight Bearing Restrictions: No      Mobility  Bed Mobility               General bed mobility comments: seated upright at EOB with RN upon arrival  Transfers Overall transfer level: Needs assistance Equipment used: 2 person hand held assist Transfers: Sit to/from Stand Sit to Stand: Min assist;+2 physical assistance         General transfer comment: increased time and effort, assistance needed due to pain  Ambulation/Gait Ambulation/Gait assistance: Min guard Gait Distance (Feet): 100 Feet Assistive device: Rolling walker (2 wheeled) Gait Pattern/deviations: Step-through pattern;Decreased stride length Gait velocity: decreased   General Gait Details: pt with  slow, steady gait, no instability or LOB, min guard for safety  Stairs            Wheelchair Mobility    Modified Rankin (Stroke Patients Only)       Balance Overall balance assessment: Needs assistance Sitting-balance support: Feet supported Sitting balance-Leahy Scale: Fair     Standing balance support: Single extremity supported;Bilateral upper extremity supported Standing balance-Leahy Scale: Poor                               Pertinent Vitals/Pain Pain Assessment: Faces Faces Pain Scale: Hurts even more Pain Location: chest tube site Pain Descriptors / Indicators: Guarding Pain Intervention(s): Monitored during session;Repositioned    Home Living Family/patient expects to be discharged to:: Private residence Living Arrangements: Spouse/significant other Available Help at Discharge: Family;Available 24 hours/day;Other (Comment)(son is coming in from [facility] to stay during pt's recovery) Type of Home: House Home Access: Level entry     Home Layout: Other (Comment)(a few steps to the den from the rest of the house) Home Equipment: None      Prior Function Level of Independence: Independent         Comments: retired, enjoys International aid/development worker   Dominant Hand: Right    Extremity/Trunk Assessment   Upper Extremity Assessment Upper Extremity Assessment: LUE deficits/detail LUE Deficits / Details: pt with burning sensation to L middle palm and up through middle finger on the palmar aspect LUE: Unable to fully  assess due to pain    Lower Extremity Assessment Lower Extremity Assessment: Overall WFL for tasks assessed    Cervical / Trunk Assessment Cervical / Trunk Assessment: Normal  Communication   Communication: No difficulties  Cognition Arousal/Alertness: Awake/alert Behavior During Therapy: WFL for tasks assessed/performed Overall Cognitive Status: Within Functional Limits for tasks assessed                                         General Comments      Exercises     Assessment/Plan    PT Assessment Patient needs continued PT services  PT Problem List Decreased strength;Decreased activity tolerance;Decreased mobility;Pain       PT Treatment Interventions DME instruction;Gait training;Stair training;Functional mobility training;Patient/family education    PT Goals (Current goals can be found in the Care Plan section)  Acute Rehab PT Goals Patient Stated Goal: get back to independence PT Goal Formulation: With patient Time For Goal Achievement: 02/04/19 Potential to Achieve Goals: Good    Frequency Min 2X/week   Barriers to discharge        Co-evaluation               AM-PAC PT "6 Clicks" Mobility  Outcome Measure Help needed turning from your back to your side while in a flat bed without using bedrails?: A Little Help needed moving from lying on your back to sitting on the side of a flat bed without using bedrails?: A Little Help needed moving to and from a bed to a chair (including a wheelchair)?: A Little Help needed standing up from a chair using your arms (e.g., wheelchair or bedside chair)?: A Little Help needed to walk in hospital room?: A Little Help needed climbing 3-5 steps with a railing? : A Little 6 Click Score: 18    End of Session   Activity Tolerance: Patient tolerated treatment well Patient left: in chair;with call bell/phone within reach Nurse Communication: Mobility status PT Visit Diagnosis: Other abnormalities of gait and mobility (R26.89)    Time: 1247-1310 PT Time Calculation (min) (ACUTE ONLY): 23 min   Charges:   PT Evaluation $PT Eval Moderate Complexity: 1 Mod PT Treatments $Gait Training: 8-22 mins        Sherie Don, PT, DPT  Acute Rehabilitation Services Pager [phone] Office [place] 01/21/2019, 2:12 PM

## 2019-01-21 NOTE — Plan of Care (Signed)

## 2019-01-21 NOTE — Discharge Summary (Signed)
Physician Discharge Summary       [address] [place]       [place] [address]             [phone]      Patient ID: [name] MRN: [id number] DOB/AGE: [birth year] 68 y.o.  Admit date: 01/20/2019 Discharge date: 01/26/2019  Admission Diagnoses: Patient Active Problem List   Diagnosis Date Noted  . S/P lymph node biopsy 01/20/2019  . COLONIC POLYPS, ADENOMATOUS 05/24/2008  . DIVERTICULOSIS, COLON 05/24/2008    Discharge Diagnoses:  Active Problems:   S/P lymph node biopsy   Discharged Condition: good  HPI:   [name] 68 y.o. male is seen in the office  today for for incidental finding of a 16 x 9 mm lung mass in the right lower lobe.  The patient is a lifelong non-smoker.  Recently had a calcium score CT done of the heart which noted an incidental finding subsequently a CT of the chest and PET scan have been done confirming a very mildly avid right lower lobe lung nodule.  Patient has no pulmonary symptoms.  Patient has no history of trauma and no symptoms referable to the right rib.   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   Hospital Course:   The patient underwent a right VATs with resection of right intrapulmonary lymph node.  The sample was sent to pathology for review. He tolerated the procedure well and was transferred to the [facility]. POD 1 he was doing well and on room air. His chest xray only put out 185cc/24 hours. It was changed to water seal. His CXR remained stable. He felt okay other than his left hand where the arterial line was placed. He was having some shooting pain when he reached for things. We continued to encourage stretching and strengthening of this hand. PT was consulted for assistance. He continued to use his incentive spirometer hourly. We encouraged him to ambulate around the unit and to sit in the chair. He continued to progress. We removed  his chest tube on 01/22/2019 and the follow-up CXR showed RIGHT thoracostomy tube removal with unchanged tiny RIGHT apical pneumothorax. Today,  he is ambulating without issue, tolerating room air, his incisions are healing well and he is ready for discharge home.   Consults: None  Significant Diagnostic Studies:   CLINICAL DATA:  Postop right-sided video assisted thoracic surgery.  EXAM: PORTABLE CHEST 1 VIEW  COMPARISON:  Radiographs 01/20/2019 and 01/19/2019.  FINDINGS: 0539 hours. Right-sided chest tube remains in place. There is a tiny right apical pneumothorax. There is mildly worse perihilar and left lower lobe atelectasis. No significant pleural effusion. The heart size and mediastinal contours are stable.  IMPRESSION: Tiny right apical pneumothorax following right-sided VATS. Mild pulmonary atelectasis bilaterally.   Electronically Signed   By: Carey Bullocks M.D.   On: 01/21/2019 08:41    Pathology   Patient: [name] Collected: 01/20/2019 Client: [facility] Accession: [id number] Received: 01/20/2019 Sheliah Plane DOB: 02-12-1951 Age: 34 Gender: M Reported: 01/22/2019 [address] Patient Ph: [phone] MRN #: [id number] [place], [place] [address] Visit #: [id number] Chart #: Phone: NULL Fax: CC: REPORT OF SURGICAL PATHOLOGY FINAL DIAGNOSIS Diagnosis 1. Lung, biopsy, Right - TYPICAL CARCINOID TUMOR. 2. Lung, biopsy, Right - TYPICAL CARCINOID TUMOR. Microscopic Comment 1. -2. Cells of interest are positive for CKAE1AE3, CK7, TTF-1, Synaptophysin, Chromogranin and CD56.  CK20, p63, CK5/6 and CD45 are negative. Ki-67 labeling index is less than 3%. 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 Consuello Bossier MD Pathologist, Electronic Signature (Case signed 01/22/2019) Intraoperative Diagnosis 1. RIGHT LUNG MASS, FROZEN SECTION DIAGNOSIS: ATYPICAL EPITHELIOID POPULATION. (AC) 2. 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 [facility]) Specimen Gross and Clinical Information Specimen(s) Obtained: 1. Lung, biopsy, Right 2. Lung, biopsy, Right Specimen Clinical Information 1. Right lower lobe lung nodule (ag) Gross 1. Received fresh for rapid intraoperative consult is a 0.5 x 0.4 x 0.2 cm soft tan red tissue fragment. The specimen is 1 of 2 FINAL for DYSEN, HEINTZELMAN [phone]) Gross(continued) submitted in toto for frozen section. [phone]) Gross(continued) submitted in toto for frozen section. 2. Received fresh for rapid intraoperative consult are 2.0 x 1.8 x 0.5 cm of soft tan pink tissue. There is a line of staples present along one edge. A portion of the specimen is submitted for frozen section. The remainder of the specimen is entirely submitted, excluding the line of staples. A = tissue submitted for frozen section. B = remaining tissue. (GRP:kh 01/20/2019) Stain(s) used in Diagnosis: The following stain(s) were used in diagnosing the case: P63, CD 56, Chromogranin A, CK 5/6, CK AE1AE3, Thyroid Transcription Factor -1, CK 20, CK-7, KI-67-NO ACIS, CD45 (LCA), Synaptophysin. The control(s) stained appropriately. Disclaimer Some of these immunohistochemical stains may have been developed and the performance characteristics determined by [facility]. Some may not have been cleared or approved by the U.S. Food and Drug Administration. The FDA has determined that such clearance or approval is not necessary. This test is used for clinical purposes. It should not be regarded as investigational or for research. This laboratory is certified under the Clinical Laboratory Improvement Amendments of 1988 (CLIA-88) as qualified to perform high complexity clinical laboratory testing. Report signed out from the following location(s) Technical component and interpretation was performed at [facility] [address] [place], [place], [place] [address]. CLIA #: [id number],   Treatments:   NAME: MCKALE, WINBORN MEDICAL RECORD OZ:[id number] ACCOUNT [id number] DATE OF BIRTH:Apr 15, 1951 FACILITY: [facility] LOCATION: [facility] PHYSICIAN:EDWARD Bari Edward, MD  OPERATIVE REPORT  DATE OF PROCEDURE:  01/20/2019  PREOPERATIVE DIAGNOSIS:  Right chest mass.  POSTOPERATIVE DIAGNOSIS:  Right chest mass.  PROCEDURE PERFORMED:  Bronchoscopy, right video-assisted thoracoscopy with resection of interlobar lymph node with intercostal nerve block.  SURGEON:  Sheliah Plane, MD  FIRST ASSISTANT:  Brynda Greathouse, MD  BRIEF HISTORY:  The patient is a lifelong nonsmoker who had undergone a CT calcium score.  This revealed some calcification of his coronary arteries in the 50 percentile range for his age.  As an ancillary findings from this study was a question of a 1.6  cm mass along the minor fissure.  A PET scan showed mild metabolic uptake without any other lesions.  The patient was referred for surgical treatment and diagnosis.  The position of the mass did not appear to involve the bronchus but was adjacent to the  right lower lobe pulmonary arterial branch.  Primary resection was recommended to the patient who agreed and signed informed consent.   Discharge Exam: Blood pressure 127/73, pulse 64, temperature 98 F (36.7 C), temperature source Oral, resp. rate 20, height 5' 8.5" (1.74 m), weight 90.7 kg, SpO2 98 %.   General appearance: alert, cooperative and no distress Heart: regular rate and rhythm, S1, S2 normal, no murmur, click, rub or gallop and sinus brady at times Lungs: clear to auscultation bilaterally Abdomen: soft,  non-tender; bowel sounds normal; no masses,  no organomegaly Extremities: extremities normal, atraumatic, no cyanosis or edema Wound: clean and dry  Disposition:    Allergies as of 01/23/2019      Reactions   Codeine    headache      Medication List    TAKE these medications   acetaminophen 500 MG tablet Commonly known as: TYLENOL Take 2  tablets (1,000 mg total) by mouth every 6 (six) hours.   ondansetron 4 MG tablet Commonly known as: Zofran Take 1 tablet (4 mg total) by mouth every 8 (eight) hours as needed for nausea or vomiting.   rosuvastatin 10 MG tablet Commonly known as: CRESTOR Take 10 mg by mouth daily. Notes to patient: Tomorrow am 8/1   traMADol 50 MG tablet Commonly known as: ULTRAM Take 1-2 tablets (50-100 mg total) by mouth every 6 (six) hours as needed (mild pain).      Follow-up Information    Rodrigo Ran, MD. Call in 1 day(s).   Specialty: Internal Medicine Contact information: [address] [place] [address] [phone]        Rollene Rotunda, MD Follow up.   Specialty: Cardiology Why: call for an appointment.  Contact information: [address] [place] [address] [phone]        Delight Ovens, MD Follow up.   Specialty: Cardiothoracic Surgery Why: Your routine follow-up appointment is on 02/04/2019 at 3:30pm. Please arrive at 3:00pm for a chest xray located at [facility] which is on the first floor of our building. Contact information: [address] [place] [address] [phone]           Signed: Sharlene Dory 01/26/2019, 12:54 PM

## 2019-01-21 NOTE — Anesthesia Postprocedure Evaluation (Signed)
Anesthesia Post Note  Patient: [name]  Procedure(s) Performed: VIDEO BRONCHOSCOPY (N/A ) VIDEO ASSISTED THORACOSCOPY (VATS) WITH RESECTION OF RIGHT INTRAPULMONARY LYMPH NODE (Right Chest) Intercostal Nerve Block     Patient location during evaluation: PACU Anesthesia Type: General Level of consciousness: awake and alert Pain management: pain level controlled Vital Signs Assessment: post-procedure vital signs reviewed and stable Respiratory status: spontaneous breathing, nonlabored ventilation, respiratory function stable and patient connected to nasal cannula oxygen Cardiovascular status: blood pressure returned to baseline and stable Postop Assessment: no apparent nausea or vomiting Anesthetic complications: no    Last Vitals:  Vitals:   01/21/19 0327 01/21/19 0728  BP: 110/67 116/70  Pulse: (!) 56 (!) 58  Resp: 15 20  Temp: 36.8 C 36.6 C  SpO2: 99% 99%    Last Pain:  Vitals:   01/21/19 0820  TempSrc:   PainSc: 2                  Alexi Dorminey

## 2019-01-22 ENCOUNTER — Inpatient Hospital Stay (HOSPITAL_COMMUNITY): Payer: Medicare Other

## 2019-01-22 LAB — COMPREHENSIVE METABOLIC PANEL
ALT: 15 U/L (ref 0–44)
AST: 18 U/L (ref 15–41)
Albumin: 3.1 g/dL — ABNORMAL LOW (ref 3.5–5.0)
Alkaline Phosphatase: 45 U/L (ref 38–126)
Anion gap: 7 (ref 5–15)
BUN: 10 mg/dL (ref 8–23)
CO2: 29 mmol/L (ref 22–32)
Calcium: 8.3 mg/dL — ABNORMAL LOW (ref 8.9–10.3)
Chloride: 104 mmol/L (ref 98–111)
Creatinine, Ser: 0.91 mg/dL (ref 0.61–1.24)
GFR calc Af Amer: >60 mL/min (ref >60)
GFR calc non Af Amer: >60 mL/min (ref >60)
Glucose, Bld: 131 mg/dL — ABNORMAL HIGH (ref 70–99)
Potassium: 4.1 mmol/L (ref 3.5–5.1)
Sodium: 140 mmol/L (ref 135–145)
Total Bilirubin: 0.9 mg/dL (ref 0.3–1.2)
Total Protein: 5.3 g/dL — ABNORMAL LOW (ref 6.5–8.1)

## 2019-01-22 LAB — CBC
HCT: 38.2 % — ABNORMAL LOW (ref 39.0–52.0)
Hemoglobin: 12.4 g/dL — ABNORMAL LOW (ref 13.0–17.0)
MCH: 31.6 pg (ref 26.0–34.0)
MCHC: 32.5 g/dL (ref 30.0–36.0)
MCV: 97.2 fL (ref 80.0–100.0)
Platelets: 177 10*3/uL (ref 150–400)
RBC: 3.93 MIL/uL — ABNORMAL LOW (ref 4.22–5.81)
RDW: 12.7 % (ref 11.5–15.5)
WBC: 9.9 10*3/uL (ref 4.0–10.5)
nRBC: 0 % (ref 0.0–0.2)

## 2019-01-22 LAB — GLUCOSE, CAPILLARY
Glucose-Capillary: 105 mg/dL — ABNORMAL HIGH (ref 70–99)
Glucose-Capillary: 106 mg/dL — ABNORMAL HIGH (ref 70–99)
Glucose-Capillary: 108 mg/dL — ABNORMAL HIGH (ref 70–99)
Glucose-Capillary: 109 mg/dL — ABNORMAL HIGH (ref 70–99)
Glucose-Capillary: 122 mg/dL — ABNORMAL HIGH (ref 70–99)
Glucose-Capillary: 97 mg/dL (ref 70–99)

## 2019-01-22 MED ORDER — LACTULOSE 10 GM/15ML PO SOLN
10.0000 g | Freq: Every day | ORAL | Status: DC
  Administered 2019-01-23: 10 g via ORAL
  Filled 2019-01-22: qty 15

## 2019-01-22 MED ORDER — ONDANSETRON HCL 4 MG/2ML IJ SOLN: 4.0000 mg | Freq: Four times a day (QID) | INTRAMUSCULAR | Status: DC

## 2019-01-22 MED ORDER — ONDANSETRON HCL 4 MG/2ML IJ SOLN
4.0000 mg | Freq: Four times a day (QID) | INTRAMUSCULAR | Status: DC | PRN
  Administered 2019-01-22 – 2019-01-23 (×3): 4 mg via INTRAVENOUS
  Filled 2019-01-22 (×3): qty 2

## 2019-01-22 NOTE — Progress Notes (Signed)
Right chest tube removed, tolerated well. No complaints presented.

## 2019-01-22 NOTE — Progress Notes (Signed)
Fentanyl PCA 21 ml wasted in the stericycle witnessed by RN- Amina.

## 2019-01-22 NOTE — Op Note (Signed)
NAMEIZAN, [name] MEDICAL RECORD [id number] ACCOUNT [id number] DATE OF BIRTH:05/21/1951 FACILITY[facility] LOCATION: [facility] PHYSICIAN:Jlon Betker Maryruth Bun, MD  OPERATIVE REPORT  DATE OF PROCEDURE:  01/20/2019  PREOPERATIVE DIAGNOSIS:  Right chest mass.  POSTOPERATIVE DIAGNOSIS:  Right chest mass.  PROCEDURE PERFORMED:  Bronchoscopy, right video-assisted thoracoscopy with resection of interlobar lymph node with intercostal nerve block.  SURGEON:  Lanelle Bal, MD  FIRST ASSISTANT:  Melodie Bouillon, MD  BRIEF HISTORY:  The patient is a lifelong nonsmoker who had undergone a CT calcium score.  This revealed some calcification of his coronary arteries in the 50 percentile range for his age.  As an ancillary findings from this study was a question of a 1.6  cm mass along the minor fissure.  A PET scan showed mild metabolic uptake without any other lesions.  The patient was referred for surgical treatment and diagnosis.  The position of the mass did not appear to involve the bronchus but was adjacent to the  right lower lobe pulmonary arterial branch.  Primary resection was recommended to the patient who agreed and signed informed consent.  DESCRIPTION OF PROCEDURE:  The patient underwent general endotracheal anesthesia.  Initially, a single-lumen endotracheal tube was placed.  Appropriate timeout was performed, and we proceeded with video bronchoscopy to the subsegmental level, both in the  right and left tracheobronchial tree.  There were no endobronchial lesions noted with particular attention paid to the bronchus intermedius, middle lobe bronchus, and superior segment of the lower lobe.  The scope was removed.  A double-lumen  endotracheal tube was placed.  The patient was turned in lateral decubitus position with the right side up.  The right side had previously been marked.  The right chest was prepped with Betadine, draped in a sterile manner.  A second timeout was  performed  confirming laterality.  A small incision was made about the posterior axillary line 7th intercostal space, and a 5 mm port was introduced into the chest.  A 30-degree 5 mm scope was then introduced into the chest.  Adequate deflation was  obtained.  The lesion itself was not initially readily apparent.  We did make a working incision approximately the 4th intercostal space.  Between this small incision and with scoped visualization, we were able to identify an area in the fissure below  the middle lobe deep down, carefully dissecting along the fissure, identifying the right lower lobe pulmonary artery.  Just adjacent to this, a firm area was identified.  We continued to dissect with the [place] and ligature electrocautery device for  hemostasis.  As we dissected this, it became apparent there was a nodular mass, not in the lung per se, but along the fissure and adjacent to the pulmonary artery.  The pulmonary artery branching to the middle lobe was identified, and we continued  dissecting along the pulmonary artery.  The area appeared to be a lymph node.  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  We continued to dissect out the lymph node.  When enough was dissected out, we did take a biopsy forceps and sent  a portion for frozen section as we continued to dissect out the mass.  No pulmonary tissue was actually resected.  We were able to dissect out the lateral portion of the mass.  When it became very close to the pulmonary artery, we did lift the mass and  staple surrounding the tissue along the pulmonary artery branch with a vascular stapler.  The  remainder of the mass was then sent to pathology.  The frozen section on this confirmed epithelioid proliferation again, so both of the frozen sections were of  the same mass.  The pathologist noted there was no nodal tissue.  With this area cleaned of any of the remaining tissue, we did not do any further resection of the lung.  With a  solution of liposomal ____,   using a long needle from inside the chest,  each intercostal space approximately 3 to 10 were infiltrated.  A single 28-Blake drain was left through a port site.  The remainder of the incision was closed with pericostal suture with the lower rib drilled.  Interrupted 0 Vicryl in the muscle layer  running, 2-0 Vicryl subcutaneous tissue and 3-0 subcuticular stitch.  Skin edge of the lung nicely reinflated.  There was no air leak at the completion of the procedure.  Estimated blood loss 150 to 200 mL.  Sponge and needle count was reported as  correct.  RF scanning reported clear code.  The patient was awakened and extubated in the operating room and transferred to the recovery room for postoperative care.  LN/NUANCE  D:01/21/2019 T:01/22/2019 JOB:[id number]

## 2019-01-22 NOTE — Progress Notes (Addendum)
      [address]       [place] [address]             [phone]      2 Days Post-Op Procedure(s) (LRB): VIDEO BRONCHOSCOPY (N/A) VIDEO ASSISTED THORACOSCOPY (VATS) WITH RESECTION OF RIGHT INTRAPULMONARY LYMPH NODE (Right) Intercostal Nerve Block Subjective: Having some incisional pain this morning.   Objective: Vital signs in last 24 hours: Temp:  [97.8 F (36.6 C)-98.4 F (36.9 C)] 98.3 F (36.8 C) (07/30 0738) Pulse Rate:  [50-63] 56 (07/30 0316) Cardiac Rhythm: Sinus bradycardia (07/30 0730) Resp:  [15-29] 19 (07/30 0738) BP: (108-146)/(63-82) 125/76 (07/30 0738) SpO2:  [95 %-98 %] 97 % (07/30 0730)     Intake/Output from previous day: 07/29 0701 - 07/30 0700 In: 890.7 [P.O.:240; I.V.:650.7] Out: 420 [Urine:300; Chest Tube:120] Intake/Output this shift: No intake/output data recorded.  General appearance: alert, cooperative and no distress Heart: regular rate and rhythm, S1, S2 normal, no murmur, click, rub or gallop Lungs: clear to auscultation bilaterally and diimished in the lower lobes Abdomen: soft, non-tender; bowel sounds normal; no masses,  no organomegaly Extremities: extremities normal, atraumatic, no cyanosis or edema Wound: clean and dry  Lab Results: Recent Labs    01/21/19 0317 01/22/19 0204  WBC 10.5 9.9  HGB 12.4* 12.4*  HCT 37.8* 38.2*  PLT 214 177   BMET:  Recent Labs    01/21/19 0317 01/22/19 0204  NA 139 140  K 3.8 4.1  CL 106 104  CO2 25 29  GLUCOSE 157* 131*  BUN 10 10  CREATININE 0.88 0.91  CALCIUM 8.3* 8.3*    PT/INR:  Recent Labs    01/19/19 0900  LABPROT 13.2  INR 1.0   ABG    Component Value Date/Time   PHART 7.437 01/21/2019 0426   HCO3 25.7 01/21/2019 0426   O2SAT 96.2 01/21/2019 0426   CBG (last 3)  Recent Labs    01/21/19 1952 01/21/19 2339 01/22/19 0318  GLUCAP 96 116* 108*    Assessment/Plan: S/P Procedure(s) (LRB): VIDEO BRONCHOSCOPY (N/A) VIDEO ASSISTED THORACOSCOPY  (VATS) WITH RESECTION OF RIGHT INTRAPULMONARY LYMPH NODE (Right) Intercostal Nerve Block  1. CV-NSR in the 60s. BP well controlled.  2. Pulm-CPAP last night without issue. He is on room air with good oxygen saturation. He is using his incentive spirometer. CXR stable this morning and no air leak with the chest tube therefore we will remove the tube today. Also, will discontiue the PCA. PA/lat in the AM.  3. Renal-creatinine 0.88, electrolytes okay.  4. H and H 12.4/37.8, expected acute blood loss anemia.  5. Endo- blood glucose is well controlled.  Plan: Chest tube removal today and PCA removal. Will anticipate discharge maybe Saturday.    LOS: 2 days    Elgie Collard 01/22/2019  Some nausea still, d/c pca after chest tube out  Path pending I have seen and examined Phillips Grout and agree with the above assessment  and plan.  Grace Isaac MD Beeper [phone] Office [phone] 01/22/2019 8:53 AM

## 2019-01-23 ENCOUNTER — Inpatient Hospital Stay (HOSPITAL_COMMUNITY): Payer: Medicare Other

## 2019-01-23 LAB — GLUCOSE, CAPILLARY
Glucose-Capillary: 99 mg/dL (ref 70–99)
Glucose-Capillary: 78 mg/dL (ref 70–99)

## 2019-01-23 MED ORDER — ONDANSETRON HCL 4 MG PO TABS: 4.0000 mg | ORAL_TABLET | Freq: Three times a day (TID) | ORAL | 0 refills | Status: DC | PRN

## 2019-01-23 MED ORDER — TRAMADOL HCL 50 MG PO TABS: 50.0000 mg | ORAL_TABLET | Freq: Four times a day (QID) | ORAL | 0 refills | Status: DC | PRN

## 2019-01-23 MED ORDER — ACETAMINOPHEN 500 MG PO TABS: 1000.0000 mg | ORAL_TABLET | Freq: Four times a day (QID) | ORAL | 0 refills | Status: DC

## 2019-01-23 NOTE — Care Management Important Message (Signed)
Important Message  Patient Details  Name: [name] MRN: [id number] Date of Birth: Oct 14, 1950   Medicare Important Message Given:        Memory Argue 01/23/2019, 2:07 PM

## 2019-01-23 NOTE — Plan of Care (Signed)

## 2019-01-23 NOTE — Progress Notes (Signed)
      [address]       [place] [address]             [phone]      3 Days Post-Op Procedure(s) (LRB): VIDEO BRONCHOSCOPY (N/A) VIDEO ASSISTED THORACOSCOPY (VATS) WITH RESECTION OF RIGHT INTRAPULMONARY LYMPH NODE (Right) Intercostal Nerve Block Subjective: Feels okay this morning.   Objective: Vital signs in last 24 hours: Temp:  [97.5 F (36.4 C)-98.2 F (36.8 C)] 98 F (36.7 C) (07/31 0324) Pulse Rate:  [56-74] 56 (07/31 0324) Cardiac Rhythm: Normal sinus rhythm;Bundle branch block (07/31 0351) Resp:  [19-28] 22 (07/31 0324) BP: (109-150)/(69-75) 109/74 (07/31 0324) SpO2:  [98 %-99 %] 98 % (07/31 0324)     Intake/Output from previous day: 07/30 0701 - 07/31 0700 In: 657 [P.O.:637; I.V.:20] Out: -  Intake/Output this shift: No intake/output data recorded.  General appearance: alert, cooperative and no distress Heart: regular rate and rhythm, S1, S2 normal, no murmur, click, rub or gallop and sinus brady at times Lungs: clear to auscultation bilaterally Abdomen: soft, non-tender; bowel sounds normal; no masses,  no organomegaly Extremities: extremities normal, atraumatic, no cyanosis or edema Wound: clean and dry  Lab Results: Recent Labs    01/21/19 0317 01/22/19 0204  WBC 10.5 9.9  HGB 12.4* 12.4*  HCT 37.8* 38.2*  PLT 214 177   BMET:  Recent Labs    01/21/19 0317 01/22/19 0204  NA 139 140  K 3.8 4.1  CL 106 104  CO2 25 29  GLUCOSE 157* 131*  BUN 10 10  CREATININE 0.88 0.91  CALCIUM 8.3* 8.3*    PT/INR: No results for input(s): LABPROT, INR in the last 72 hours. ABG    Component Value Date/Time   PHART 7.437 01/21/2019 0426   HCO3 25.7 01/21/2019 0426   O2SAT 96.2 01/21/2019 0426   CBG (last 3)  Recent Labs    01/22/19 2003 01/22/19 2335 01/23/19 0323  GLUCAP 105* 122* 99    Assessment/Plan: S/P Procedure(s) (LRB): VIDEO BRONCHOSCOPY (N/A) VIDEO ASSISTED THORACOSCOPY (VATS) WITH RESECTION OF RIGHT  INTRAPULMONARY LYMPH NODE (Right) Intercostal Nerve Block   1. CV-SB to NSR in the 60s. BP well controlled.  2. Pulm-CXR showed small effusions. Stable. No pneumothorax. He is on room air with good oxygen saturation. He is using his incentive spirometer.  3. Renal-creatinine 0.91, electrolytes okay.  4. H and H 12.4/38.2, expected acute blood loss anemia.  5. Endo- blood glucose is well controlled.  Plan:  Possibly home later today. Will provide the patient with Ultram and Zofran for home due to on-going nausea.    LOS: 3 days    Elgie Collard 01/23/2019

## 2019-01-23 NOTE — Progress Notes (Signed)
Pt got discharged to home, discharge instructions provided and patient showed understanding to it, IV taken out,Telemonitor DC,pt left unit in wheelchair with all of the belongings accompanied with a family member (wife)  Gelila Well,RN 

## 2019-01-23 NOTE — Plan of Care (Signed)
  Problem: Education: Goal: Knowledge of General Education information will improve Description: Including pain rating scale, medication(s)/side effects and non-pharmacologic comfort measures 01/23/2019 1344 by Neil Crouch, RN Outcome: Completed/Met 01/23/2019 0919 by Neil Crouch, RN Outcome: Progressing   Problem: Health Behavior/Discharge Planning: Goal: Ability to manage health-related needs will improve 01/23/2019 1344 by Neil Crouch, RN Outcome: Completed/Met 01/23/2019 0919 by Neil Crouch, RN Outcome: Progressing   Problem: Clinical Measurements: Goal: Ability to maintain clinical measurements within normal limits will improve 01/23/2019 1344 by Neil Crouch, RN Outcome: Completed/Met 01/23/2019 0919 by Neil Crouch, RN Outcome: Progressing Goal: Will remain free from infection 01/23/2019 1344 by Neil Crouch, RN Outcome: Completed/Met 01/23/2019 0919 by Neil Crouch, RN Outcome: Progressing Goal: Diagnostic test results will improve 01/23/2019 1344 by Neil Crouch, RN Outcome: Completed/Met 01/23/2019 0919 by Neil Crouch, RN Outcome: Progressing Goal: Respiratory complications will improve 01/23/2019 1344 by Neil Crouch, RN Outcome: Completed/Met 01/23/2019 0919 by Neil Crouch, RN Outcome: Progressing Goal: Cardiovascular complication will be avoided 01/23/2019 1344 by Neil Crouch, RN Outcome: Completed/Met 01/23/2019 0919 by Neil Crouch, RN Outcome: Progressing

## 2019-01-24 LAB — TYPE AND SCREEN
ABO/RH(D): A POS
Antibody Screen: NEGATIVE
Unit division: 0
Unit division: 0
Unit division: 0
Unit division: 0

## 2019-01-24 LAB — BPAM RBC
Blood Product Expiration Date: 202008152359
Blood Product Expiration Date: 202008162359
Blood Product Expiration Date: 202008162359
Blood Product Expiration Date: 202008162359
ISSUE DATE / TIME: 202007251023
ISSUE DATE / TIME: 202007251023
Unit Type and Rh: 6200
Unit Type and Rh: 6200
Unit Type and Rh: 6200
Unit Type and Rh: 6200

## 2019-01-29 ENCOUNTER — Other Ambulatory Visit: Payer: Self-pay | Admitting: *Deleted

## 2019-01-29 NOTE — Progress Notes (Signed)
The proposed treatment discussed in cancer conference 01/29/2019 is for discussion purpose only and is not a binding recommendation.  The patient was not physically examined nor present for their treatment options.  Therefore, final treatment plans cannot be decided.  

## 2019-01-30 ENCOUNTER — Other Ambulatory Visit: Payer: Self-pay | Admitting: Cardiothoracic Surgery

## 2019-01-30 DIAGNOSIS — Z9889 Other specified postprocedural states: Secondary | ICD-10-CM

## 2019-02-02 ENCOUNTER — Other Ambulatory Visit: Payer: Self-pay

## 2019-02-02 ENCOUNTER — Encounter: Payer: Self-pay | Admitting: Cardiothoracic Surgery

## 2019-02-02 ENCOUNTER — Ambulatory Visit (INDEPENDENT_AMBULATORY_CARE_PROVIDER_SITE_OTHER): Payer: Self-pay | Admitting: Cardiothoracic Surgery

## 2019-02-02 ENCOUNTER — Ambulatory Visit
Admission: RE | Admit: 2019-02-02 | Discharge: 2019-02-02 | Disposition: A | Discharge status: Home / Self Care | Payer: Medicare Other | Source: Ambulatory Visit | Attending: Cardiothoracic Surgery | Admitting: Cardiothoracic Surgery

## 2019-02-02 VITALS — BP 138/83 | HR 69 | Temp 97.5°F | Resp 20 | Ht 68.5 in | Wt 199.0 lb

## 2019-02-02 DIAGNOSIS — Z9889 Other specified postprocedural states: Secondary | ICD-10-CM

## 2019-02-02 DIAGNOSIS — D3A090 Benign carcinoid tumor of the bronchus and lung: Secondary | ICD-10-CM

## 2019-02-02 DIAGNOSIS — R911 Solitary pulmonary nodule: Secondary | ICD-10-CM

## 2019-02-02 NOTE — Progress Notes (Signed)
[address]       [place] [address]             [phone]                  Samit S Podoll [place] Medical Record #[id number] Date of Birth: [birth year]  Referring HU:[id number], Elta Guadeloupe, MD Primary Cardiology: Primary Care:Perini, Elta Guadeloupe, MD  Chief Complaint:  Follow Up Visit OPERATIVE REPORT DATE OF PROCEDURE:  01/20/2019 PREOPERATIVE DIAGNOSIS:  Right chest mass. POSTOPERATIVE DIAGNOSIS:  Right chest mass. PROCEDURE PERFORMED:  Bronchoscopy, right video-assisted thoracoscopy with resection of interlobar lymph node with intercostal nerve block.  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  The patient is doing well postoperatively, he stopped using pain medicine last week.  Denies any fever chills or other respiratory symptoms.      Zubrod Score: At the time of surgery this patient's most appropriate activity status/level should be described as: [x]     0    Normal activity, no symptoms []     1    Restricted in physical strenuous activity but ambulatory, able to do out light work []     2    Ambulatory and capable of self care, unable to do work activities, up and about                 >50 % of waking hours                                                                                   []     3    Only limited self care, in bed greater than 50% of waking hours []     4    Completely disabled, no self care, confined to bed or chair []     5    Moribund  Social History   Tobacco Use  Smoking Status Never Smoker  Smokeless Tobacco Never Used       Allergies  Allergen Reactions  . Codeine     headache    Current Outpatient Medications  Medication Sig Dispense Refill  . rosuvastatin (CRESTOR) 10 MG tablet Take 10 mg by mouth daily.      No current facility-administered medications for this visit.         Physical Exam: BP 138/83   Pulse 69   Temp (!) 97.5 F (36.4 C) (Skin)   Resp 20   Ht 5' 8.5" (1.74 m)   Wt 199 lb (90.3 kg)   SpO2 96% Comment: RA  BMI 29.82 kg/m   General appearance: alert, cooperative and no distress Neurologic: intact Heart: regular rate and rhythm, S1, S2 normal, no murmur, click, rub or gallop Lungs: clear to auscultation bilaterally Abdomen: soft, non-tender; bowel sounds normal; no masses,  no organomegaly Extremities: extremities normal, atraumatic, no cyanosis or edema and Homans sign is negative, no sign of DVT Wounds: Chest tube site port sites and incisional sites are all well-healed   Diagnostic Studies & Laboratory data:  Recent Radiology Findings: Dg Chest 2 View  Result Date: 02/02/2019 CLINICAL DATA:  Status post right-sided video assisted thoracic surgery. EXAM: CHEST - 2 VIEW COMPARISON:  Radiographs of January 23, 2019. FINDINGS: The heart size and mediastinal contours are within normal limits. Both lungs are clear. No pneumothorax or pleural effusion is noted. The visualized skeletal structures are unremarkable. IMPRESSION: No active cardiopulmonary disease. Electronically Signed   By: Marijo Conception M.D.   On: 02/02/2019 14:22      I have independently reviewed the above radiology findings and reviewed findings  with the patient.  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  Patient is making good postop progress.  He was allowed to return to driving, will avoid heavy  lifting.  We will plan to see him back in 6 weeks with a chest x-ray.  His case imaging and pathology was reviewed at the multidisciplinary thoracic oncology conference last week, no further treatment, will plan follow-up CT year postop.    Grace Isaac 02/02/2019 2:48 PM

## 2019-02-04 ENCOUNTER — Ambulatory Visit: Payer: Medicare Other | Admitting: Cardiothoracic Surgery

## 2019-02-05 ENCOUNTER — Telehealth: Payer: Self-pay | Admitting: Orthopaedic Surgery

## 2019-02-05 NOTE — Telephone Encounter (Signed)
Can we do this please 

## 2019-02-05 NOTE — Telephone Encounter (Signed)
Pt called in requesting we send a request to his insurance for a gel injection for his right knee.   [phone]

## 2019-02-06 ENCOUNTER — Telehealth: Payer: Self-pay

## 2019-02-06 NOTE — Telephone Encounter (Signed)
Noted  

## 2019-02-06 NOTE — Telephone Encounter (Signed)
Submitted VOB for SynviscOne, right knee. 

## 2019-02-13 ENCOUNTER — Telehealth: Payer: Self-pay

## 2019-02-13 NOTE — Telephone Encounter (Signed)
Patient aware that he is approved for gel injection.  Approved for SynviscOne, right knee. Buy & Bill  Covered at 100% through his insurance. No Co-pay No PA required  Appt. 02/24/2019 with Dr. Ninfa Linden

## 2019-02-24 ENCOUNTER — Encounter: Payer: Self-pay | Admitting: Orthopaedic Surgery

## 2019-02-24 ENCOUNTER — Ambulatory Visit (INDEPENDENT_AMBULATORY_CARE_PROVIDER_SITE_OTHER): Payer: Medicare Other | Admitting: Orthopaedic Surgery

## 2019-02-24 DIAGNOSIS — M1711 Unilateral primary osteoarthritis, right knee: Secondary | ICD-10-CM

## 2019-02-24 MED ORDER — HYLAN G-F 20 48 MG/6ML IX SOSY
48.0000 mg | PREFILLED_SYRINGE | INTRA_ARTICULAR | Status: AC | PRN
  Administered 2019-02-24: 48 mg via INTRA_ARTICULAR

## 2019-02-24 NOTE — Progress Notes (Signed)
   Procedure Note  Patient: [name]             Date of Birth: 08/10/1950           MRN: [id number]             Visit Date: 02/24/2019  Procedures: Visit Diagnoses:  1. Primary osteoarthritis of right knee     Large Joint Inj on 02/24/2019 9:34 AM Indications: pain and diagnostic evaluation Details: 22 G 1.5 in needle, superolateral approach  Arthrogram: No  Medications: 48 mg Hylan 48 MG/6ML Outcome: tolerated well, no immediate complications Procedure, treatment alternatives, risks and benefits explained, specific risks discussed. Consent was given by the patient. Immediately prior to procedure a time out was called to verify the correct patient, procedure, equipment, support staff and site/side marked as required. Patient was prepped and draped in the usual sterile fashion.    The patient is here today for scheduled hyaluronic acid injection in his right knee to treat the pain from osteoarthritis.  He has had these type of injections before and is fully aware of the risk and benefits of the injections.  He has failed other treatment modalities for his right knee osteoarthritis.  Injection replaced today is Synvisc 1.  He has had no other health changes in his medical status and does report knee pain is mainly activity related.  On examination of the right knee today there is no effusion.  There is medial joint line tenderness and excellent range of motion.  He tolerated the Synvisc 1 injection well.  All question concerns were answered and addressed.  Follow-up is as needed.

## 2019-03-19 ENCOUNTER — Ambulatory Visit: Payer: Medicare Other | Admitting: Cardiothoracic Surgery

## 2019-03-25 ENCOUNTER — Other Ambulatory Visit: Payer: Self-pay | Admitting: Cardiothoracic Surgery

## 2019-03-25 DIAGNOSIS — D3A090 Benign carcinoid tumor of the bronchus and lung: Secondary | ICD-10-CM

## 2019-03-26 ENCOUNTER — Ambulatory Visit
Admission: RE | Admit: 2019-03-26 | Discharge: 2019-03-26 | Disposition: A | Discharge status: Home / Self Care | Payer: Medicare Other | Source: Ambulatory Visit | Attending: Cardiothoracic Surgery | Admitting: Cardiothoracic Surgery

## 2019-03-26 ENCOUNTER — Other Ambulatory Visit: Payer: Self-pay

## 2019-03-26 ENCOUNTER — Ambulatory Visit (INDEPENDENT_AMBULATORY_CARE_PROVIDER_SITE_OTHER): Payer: Self-pay | Admitting: Cardiothoracic Surgery

## 2019-03-26 ENCOUNTER — Encounter: Payer: Self-pay | Admitting: Cardiothoracic Surgery

## 2019-03-26 VITALS — BP 153/84 | HR 68 | Temp 97.9°F | Resp 18 | Ht 68.5 in | Wt 211.6 lb

## 2019-03-26 DIAGNOSIS — D3A090 Benign carcinoid tumor of the bronchus and lung: Secondary | ICD-10-CM

## 2019-03-26 NOTE — Progress Notes (Signed)
[address]       [place] [address]             [phone]                  Namon S Brearley [place] Medical Record #[id number] Date of Birth: 1951-06-18  Referring FB:[id number], Elta Guadeloupe, MD Primary Cardiology: Primary Care:Perini, Elta Guadeloupe, MD  Chief Complaint:  Follow Up Visit OPERATIVE REPORT DATE OF PROCEDURE:  01/20/2019 PREOPERATIVE DIAGNOSIS:  Right chest mass. POSTOPERATIVE DIAGNOSIS:  Right chest mass. PROCEDURE PERFORMED:  Bronchoscopy, right video-assisted thoracoscopy with resection of interlobar lymph node with intercostal nerve block.  Diagnosis 1. Lung, biopsy, Right - TYPICAL CARCINOID TUMOR. 2. Lung, biopsy, Right - TYPICAL CARCINOID TUMOR. Microscopic Comment 1. -2. Cells of interest are positive for CKAE1AE3, CK7, TTF-1, Synaptophysin, Chromogranin and CD56. CK20, p63, CK5/6 and CD45 are negative. Ki-67 labeling index is less than 3%. 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  Patient continues to do well , he has some mild paresthesias over the right chest he is not needing any pain medicine and has not needed any since the week after surgery .  Denies any fever chills or other respiratory symptoms.      Zubrod Score: At the time of surgery this patient's most appropriate activity status/level should be described as: '[x]'$     0    Normal activity, no symptoms '[]'$     1    Restricted in physical strenuous activity but ambulatory, able to do out light work '[]'$     2    Ambulatory and capable of self care, unable to do work activities, up and about                 >50 % of waking hours                                                                                   '[]'$     3    Only limited self care, in bed  greater than 50% of waking hours '[]'$     4    Completely disabled, no self care, confined to bed or chair '[]'$     5    Moribund  Social History   Tobacco Use  Smoking Status Never Smoker  Smokeless Tobacco Never Used       Allergies  Allergen Reactions  . Codeine     headache    Current Outpatient Medications  Medication Sig Dispense Refill  . rosuvastatin (CRESTOR) 10 MG tablet Take 10 mg by mouth daily.      No current facility-administered medications for this visit.        Physical Exam: BP (!) 153/84   Pulse 68   Temp 97.9 F (36.6 C) (Skin)   Resp 18   Ht 5' 8.5" (1.74 m)   Wt 211 lb 9.6 oz (96 kg)   SpO2 97% Comment: RA  BMI 31.71 kg/m  General appearance: alert, cooperative and no distress Head: Normocephalic, without obvious abnormality, atraumatic Neck: no adenopathy, no carotid bruit, no JVD, supple, symmetrical, trachea midline and thyroid not enlarged, symmetric, no tenderness/mass/nodules Lymph nodes: Cervical, supraclavicular, and axillary nodes normal. Resp: clear to auscultation bilaterally Back: symmetric, no curvature. ROM normal. No CVA tenderness. Cardio: regular rate and rhythm, S1, S2 normal, no murmur, click, rub or gallop GI: soft, non-tender; bowel sounds normal; no masses,  no organomegaly Extremities: extremities normal, atraumatic, no cyanosis or edema and Homans sign is negative, no sign of DVT Neurologic: Grossly normal Wounds: Chest tube site port sites and incisional sites are all well-healed   Diagnostic Studies & Laboratory data:         Recent Radiology Findings: Dg Chest 2 View  Result Date: 03/26/2019 CLINICAL DATA:  Status post right-sided video-assisted thoracic surgery. History of carcinoid tumor of lung. EXAM: CHEST - 2 VIEW COMPARISON:  Radiographs of February 02, 2019. FINDINGS: The heart size and mediastinal contours are within normal limits. Both lungs are clear. No pneumothorax or pleural effusion is noted. The  visualized skeletal structures are unremarkable. IMPRESSION: No active cardiopulmonary disease. Electronically Signed   By: Marijo Conception M.D.   On: 03/26/2019 13:34      I have independently reviewed the above radiology findings and reviewed findings  with the patient.  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   6 month follow up ct chest no contrast  Grace Isaac 03/26/2019 2:29 PM

## 2019-05-04 ENCOUNTER — Telehealth: Payer: Self-pay | Admitting: Orthopaedic Surgery

## 2019-05-04 NOTE — Telephone Encounter (Signed)
Patient called asked if Dr Ninfa Linden can prescribe a topical or oral medication for his osteoarthritis. The number to contact patient is [phone]

## 2019-05-04 NOTE — Telephone Encounter (Signed)
Patient called and left a message on the voicemail. Would like to speak with Caryl Pina. His call back number is [phone]

## 2019-05-05 ENCOUNTER — Other Ambulatory Visit: Payer: Self-pay

## 2019-05-05 MED ORDER — DICLOFENAC SODIUM 1 % TD GEL: 2.0000 g | Freq: Four times a day (QID) | TRANSDERMAL | 3 refills | Status: DC

## 2019-05-05 NOTE — Telephone Encounter (Signed)
Sent in Rx to his pharmacy

## 2019-06-18 ENCOUNTER — Encounter: Payer: Self-pay | Admitting: *Deleted

## 2019-07-02 ENCOUNTER — Ambulatory Visit (INDEPENDENT_AMBULATORY_CARE_PROVIDER_SITE_OTHER): Payer: Medicare Other

## 2019-07-02 ENCOUNTER — Ambulatory Visit (INDEPENDENT_AMBULATORY_CARE_PROVIDER_SITE_OTHER): Payer: Medicare Other | Admitting: Orthopaedic Surgery

## 2019-07-02 ENCOUNTER — Encounter: Payer: Self-pay | Admitting: Orthopaedic Surgery

## 2019-07-02 ENCOUNTER — Other Ambulatory Visit: Payer: Self-pay

## 2019-07-02 VITALS — Ht 69.69 in | Wt 211.8 lb

## 2019-07-02 DIAGNOSIS — M1711 Unilateral primary osteoarthritis, right knee: Secondary | ICD-10-CM

## 2019-07-02 NOTE — Progress Notes (Signed)
Office Visit Note   Patient: [name]           Date of Birth: [birth year]           MRN: [id number] Visit Date: 07/02/2019              Requested by: [name], MD [address] [place],  [place] [address] PCP: [name], MD   Assessment & Plan: Visit Diagnoses:  1. Primary osteoarthritis of right knee     Plan:  Due to the fact the patient has failed conservative treatment continues to have pain in his right knee due to the arthritic changes is affecting his quality of life recommend right total knee arthroplasty.  He would like to proceed with this in the near future.  Questions encouraged and answered by Dr. Ninfa Linden myself.  Risk and benefits of total knee replacement with discussed with the patient.  Risk include but are not limited to DVT/PE, infection, prolonged pain worsening pain and nerve or vessel injury.  He will follow-up with [place] 2 weeks postop.  We will try to get this scheduled as soon as possible however due to the Covid pandemic this may take some time to be scheduled.   Follow-Up Instructions: Return in about 2 weeks (around 07/16/2019).   Orders:  Orders Placed This Encounter  Procedures  . XR Knee 1-2 Views Right   No orders of the defined types were placed in this encounter.     Procedures: No procedures performed   Clinical Data: No additional findings.   Subjective: Chief Complaint  Patient presents with  . Right Knee - Pain    HPI Danny. Guzman comes in today due to right knee pain.  He is well-known to Dr. Ninfa Linden service.  He states last Synvisc 1 injection on 07/14/2018 did not help for long.  He is having decreased range of motion and mobility.  He is tired of the way to the knee pain dictates his activities.  Patient is overall very healthy.  Had a benign lung mass removed from the right side last year reports he did well with this.  Denies any cardiac issues.  Denies any fevers chills shortness of breath chest pain. Review of  Systems Please see HPI otherwise negative  Objective: Vital Signs: Ht 5' 9.69" (1.77 m)   Wt 211 lb 12.8 oz (96.1 kg)   BMI 30.67 kg/m   Physical Exam Constitutional:      Appearance: He is normal weight. He is not ill-appearing or diaphoretic.  Pulmonary:     Effort: Pulmonary effort is normal.  Neurological:     Mental Status: He is alert and oriented to person, place, and time.  Psychiatric:        Behavior: Behavior normal.     Ortho Exam Bilateral knees: Left knee good range of motion without pain.  Bilateral knees without abnormal warmth erythema.  Right knee mild to moderate effusion.  Right knee with tenderness along the lateral joint line.  No instability valgus varus stressing.Right calf supple nontender. Specialty Comments:  No specialty comments available.  Imaging: XR Knee 1-2 Views Right  Result Date: 07/02/2019 Right knee AP lateral views: No acute fracture.  Lateral joint line with moderately severe narrowing.  Moderate patellofemoral arthritic changes.  Medial compartment with blunting of the femoral condyle.  No acute fracture.    PMFS History: Patient Active Problem List   Diagnosis Date Noted  . 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 COLONIC  POLYPS, ADENOMATOUS 05/24/2008  . DIVERTICULOSIS, COLON 05/24/2008   Past Medical History:  Diagnosis Date  . Arthritis    knee, no meds  . 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 Eczema 08/08/2010  . GERD (gastroesophageal reflux disease)    weight loss resolved gerd, no medication  . Hyperlipidemia   . PONV (postoperative nausea and vomiting)   . Sleep apnea    uses CPAP nightly    Family History  Problem Relation Age of Onset  . Heart disease Mother        CABG age 41s  . Colon cancer Neg Hx   . Stomach cancer Neg Hx     Past Surgical History:  Procedure Laterality Date  . COLONOSCOPY     hx polyps  . EYE SURGERY     lasik  . INTERCOSTAL NERVE BLOCK  01/20/2019   Procedure: Intercostal Nerve  Block;  Surgeon: Grace Isaac, MD;  Location: [facility];  Service: Thoracic;;  . KNEE ARTHROSCOPY Right   . SEPTOPLASTY  1980   x 2  . TONSILLECTOMY AND ADENOIDECTOMY  1960  . VIDEO ASSISTED THORACOSCOPY (VATS)/WEDGE RESECTION Right 01/20/2019   Procedure: VIDEO ASSISTED THORACOSCOPY (VATS) WITH RESECTION OF RIGHT INTRAPULMONARY LYMPH NODE;  Surgeon: Grace Isaac, MD;  Location: [facility];  Service: Thoracic;  Laterality: Right;  Marland Kitchen VIDEO BRONCHOSCOPY N/A 01/20/2019   Procedure: VIDEO BRONCHOSCOPY;  Surgeon: Grace Isaac, MD;  Location: [facility];  Service: Thoracic;  Laterality: N/A;   Social History   Occupational History  . Not on file  Tobacco Use  . Smoking status: Never Smoker  . Smokeless tobacco: Never Used  Substance and Sexual Activity  . Alcohol use: Yes    Alcohol/week: 10.0 standard drinks    Types: 10 Glasses of wine per week  . Drug use: No  . Sexual activity: Not on file

## 2019-07-08 ENCOUNTER — Telehealth: Payer: Self-pay | Admitting: Orthopaedic Surgery

## 2019-07-17 ENCOUNTER — Other Ambulatory Visit: Payer: Self-pay

## 2019-07-21 ENCOUNTER — Other Ambulatory Visit: Payer: Self-pay

## 2019-07-21 ENCOUNTER — Encounter (HOSPITAL_BASED_OUTPATIENT_CLINIC_OR_DEPARTMENT_OTHER): Payer: Self-pay | Admitting: Orthopaedic Surgery

## 2019-07-22 ENCOUNTER — Other Ambulatory Visit: Payer: Self-pay | Admitting: Physician Assistant

## 2019-07-24 ENCOUNTER — Encounter (HOSPITAL_COMMUNITY): Payer: Self-pay

## 2019-07-24 NOTE — Progress Notes (Signed)
[place], [place] [address] Phone: [phone] Fax: [phone]      Your procedure is scheduled on Thursday, February 4th, 2021.  Report to [facility] Main Entrance "A" at 5:30 A.M., and check in at the Admitting office.  Call this number if you have problems the morning of surgery:  [phone]  Call [phone] if you have any questions prior to your surgery date Monday-Friday 8am-4pm    Remember:  Do not eat after midnight the night before your surgery  You may drink clear liquids until 4:30AM the morning of your surgery.    Clear liquids allowed are: Water, Non-Citrus Juices (without pulp), Carbonated Beverages, Clear Tea, Black Coffee Only, and Gatorade    Take NO medicines the morning of surgery.   From this point forward,  prior to surgery, STOP taking any Aspirin (unless otherwise instructed by your surgeon), Aleve, Naproxen, Ibuprofen, Motrin, Advil, Goody's, BC's, all herbal medications, fish oil, and all vitamins.    The Morning of Surgery  Do not wear jewelry.  Do not wear lotions, powders, or perfumes/colognes, or deodorant  Do not shave 48 hours prior to surgery.  Men may shave face and neck.  Do not bring valuables to the hospital.  [facility] is not responsible for any belongings or valuables.  If you are a smoker, DO NOT Smoke 24 hours prior to surgery  If you wear a CPAP at night please bring your mask the morning of surgery   Remember that you must have someone to transport you home after your surgery, and remain with you for 24 hours if you are discharged the same day.   Please bring cases for contacts, glasses, hearing aids, dentures or bridgework because it cannot be worn into surgery.    Leave your suitcase in the car.  After surgery it may be brought to your room.  For patients admitted to the hospital, discharge time will be determined by your  treatment team.  Patients discharged the day of surgery will not be allowed to drive home.    Special instructions:   [place]- Preparing For Surgery  Before surgery, you can play an important role. Because skin is not sterile, your skin needs to be as free of germs as possible. You can reduce the number of germs on your skin by washing with CHG (chlorahexidine gluconate) Soap before surgery.  CHG is an antiseptic cleaner which kills germs and bonds with the skin to continue killing germs even after washing.    Oral Hygiene is also important to reduce your risk of infection.  Remember - BRUSH YOUR TEETH THE MORNING OF SURGERY WITH YOUR REGULAR TOOTHPASTE  Please do not use if you have an allergy to CHG or antibacterial soaps. If your skin becomes reddened/irritated stop using the CHG.  Do not shave (including legs and underarms) for at least 48 hours prior to first CHG shower. It is OK to shave your face.  Please follow these instructions carefully.   1. Shower the NIGHT BEFORE SURGERY and the MORNING OF SURGERY with CHG Soap.   2. If you chose to wash your hair, wash your hair first as usual with your normal shampoo.  3. After you shampoo, rinse your hair and body thoroughly to remove the shampoo.  4. Use CHG as you would any other liquid soap. You can apply CHG directly to the skin and wash gently with a  scrungie or a clean washcloth.   5. Apply the CHG Soap to your body ONLY FROM THE NECK DOWN.  Do not use on open wounds or open sores. Avoid contact with your eyes, ears, mouth and genitals (private parts). Wash Face and genitals (private parts)  with your normal soap.   6. Wash thoroughly, paying special attention to the area where your surgery will be performed.  7. Thoroughly rinse your body with warm water from the neck down.  8. DO NOT shower/wash with your normal soap after using and rinsing off the CHG Soap.  9. Pat yourself dry with a CLEAN TOWEL.  10. Wear CLEAN  PAJAMAS to bed the night before surgery, wear comfortable clothes the morning of surgery  11. Place CLEAN SHEETS on your bed the night of your first shower and DO NOT SLEEP WITH PETS.    Day of Surgery:  Please shower the morning of surgery with the CHG soap Do not apply any deodorants/lotions. Please wear clean clothes to the hospital/surgery center.   Remember to brush your teeth WITH YOUR REGULAR TOOTHPASTE.   Please read over the following fact sheets that you were given.

## 2019-07-25 ENCOUNTER — Ambulatory Visit: Payer: Medicare Other

## 2019-07-27 ENCOUNTER — Encounter (HOSPITAL_COMMUNITY)
Admission: RE | Admit: 2019-07-27 | Discharge: 2019-07-27 | Disposition: A | Discharge status: Home / Self Care | Payer: Medicare Other | Source: Ambulatory Visit | Attending: Orthopaedic Surgery | Admitting: Orthopaedic Surgery

## 2019-07-27 ENCOUNTER — Other Ambulatory Visit: Payer: Self-pay

## 2019-07-27 ENCOUNTER — Encounter (HOSPITAL_COMMUNITY): Payer: Self-pay

## 2019-07-27 ENCOUNTER — Other Ambulatory Visit (HOSPITAL_COMMUNITY)
Admission: RE | Admit: 2019-07-27 | Discharge: 2019-07-27 | Disposition: A | Discharge status: Home / Self Care | Payer: Medicare Other | Source: Ambulatory Visit | Attending: Orthopaedic Surgery | Admitting: Orthopaedic Surgery

## 2019-07-27 DIAGNOSIS — Z01812 Encounter for preprocedural laboratory examination: Secondary | ICD-10-CM | POA: Diagnosis present

## 2019-07-27 DIAGNOSIS — Z20822 Contact with and (suspected) exposure to covid-19: Secondary | ICD-10-CM | POA: Diagnosis not present

## 2019-07-27 HISTORY — DX: Family history of other specified conditions: Z84.89

## 2019-07-27 LAB — CBC
HCT: 44.3 % (ref 39.0–52.0)
Hemoglobin: 14.3 g/dL (ref 13.0–17.0)
MCH: 30.6 pg (ref 26.0–34.0)
MCHC: 32.3 g/dL (ref 30.0–36.0)
MCV: 94.9 fL (ref 80.0–100.0)
Platelets: 235 10*3/uL (ref 150–400)
RBC: 4.67 MIL/uL (ref 4.22–5.81)
RDW: 12.7 % (ref 11.5–15.5)
WBC: 3.9 10*3/uL — ABNORMAL LOW (ref 4.0–10.5)
nRBC: 0 % (ref 0.0–0.2)

## 2019-07-27 LAB — BASIC METABOLIC PANEL
Anion gap: 8 (ref 5–15)
BUN: 16 mg/dL (ref 8–23)
CO2: 28 mmol/L (ref 22–32)
Calcium: 9.1 mg/dL (ref 8.9–10.3)
Chloride: 105 mmol/L (ref 98–111)
Creatinine, Ser: 0.82 mg/dL (ref 0.61–1.24)
GFR calc Af Amer: >60 mL/min (ref >60)
GFR calc non Af Amer: >60 mL/min (ref >60)
Glucose, Bld: 109 mg/dL — ABNORMAL HIGH (ref 70–99)
Potassium: 4.5 mmol/L (ref 3.5–5.1)
Sodium: 141 mmol/L (ref 135–145)

## 2019-07-27 LAB — SURGICAL PCR SCREEN
MRSA, PCR: NEGATIVE
Staphylococcus aureus: NEGATIVE

## 2019-07-27 NOTE — Progress Notes (Addendum)
PCP - [name], MD Cardiologist - Minus Breeding, MD  Chest x-ray - N/A EKG - 01/14/19 Stress Test - denies ECHO - denies Cardiac Cath - denies  Sleep Study - unknown; years ago CPAP - wears nightly; pressure settings 9-22  Blood Thinner Instructions: N/A Aspirin Instructions: N/A  ERAS Protcol - May drink clear liquids until 0430 DOS  COVID TEST- scheduled after PAT visit today; aware of need for self-quarantine  Coronavirus Screening  Have you experienced the following symptoms:  Cough yes/no: No Fever (>100.29F)  yes/no: No Runny nose yes/no: No Sore throat yes/no: No Difficulty breathing/shortness of breath  yes/no: No  Have you or a family member traveled in the last 14 days and where? yes/no: No  If the patient indicates "YES" to the above questions, their PAT will be rescheduled to limit the exposure to others and, the surgeon will be notified. THE PATIENT WILL NEED TO BE ASYMPTOMATIC FOR 14 DAYS.   If the patient is not experiencing any of these symptoms, the PAT nurse will instruct them to NOT bring anyone with them to their appointment since they may have these symptoms or traveled as well.   Please remind your patients and families that hospital visitation restrictions are in effect and the importance of the restrictions.   Anesthesia review: Yes; recent lung CA 12/2018  Patient denies shortness of breath, fever, cough and chest pain at PAT appointment   All instructions explained to the patient, with a verbal understanding of the material. Patient agrees to go over the instructions while at home for a better understanding. Patient also instructed to self quarantine after being tested for COVID-19. The opportunity to ask questions was provided.

## 2019-07-28 LAB — SARS CORONAVIRUS 2 (TAT 6-24 HRS): SARS Coronavirus 2: NEGATIVE

## 2019-07-28 NOTE — Progress Notes (Signed)
Anesthesia Chart Review:  Pt recently underwent Right VATS with resection of intrapulmonary lymph node. Per Dr. Everrett Coombe followup notes, pt recovering well from surgery. 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 He is undergoing surveillance CT scans.  Prior to VATS, pt had preoperative cardiology evaluation by Dr. Percival Spanish on 01/14/19 states, "PREOP CARDIOVASCULAR:The patient has a very high functional level. He is going for a low risk procedure even if he were to have wedge resection. He has no symptoms. He has no high risk cardiovascular findings. Given this and according to ACC/AHA guidelines no further cardiovascular testing is indicated."  Proep labs reviewed, unremarkable.  EKG: 01/14/19: NSR. LAD.  CHEST - 2 VIEW 03/26/19:  COMPARISON:  Radiographs of February 02, 2019.  FINDINGS: The heart size and mediastinal contours are within normal limits. Both lungs are clear. No pneumothorax or pleural effusion is noted. The visualized skeletal structures are unremarkable.  IMPRESSION: No active cardiopulmonary disease.  PFTs 01/14/19: FVC 4.01 (88%), post 4.04 (89%). FEV1 3.40 (101%), post 3.54 (105%). DLCO unc  25.14 (95%).  CT Heart for calcium scoring 12/16/18: IMPRESSION: 1. Coronary calcium score is 87.9 and this is at percentile 48 for patients of the same age, gender and ethnicity. 2. Irregular lobulated lesion in the right lower lobe measuring up to 1.7 cm. This lesion is not well characterized on this noncontrast examination but concerning for a pulmonary neoplasm. Recommend further characterization with a chest CT with IV contrast. 3. Aortic Atherosclerosis (ICD10-I70.0).   [name] [facility] Stay [facility]/Anesthesiology Phone [phone] 07/28/2019 12:59 PM

## 2019-07-28 NOTE — Anesthesia Preprocedure Evaluation (Addendum)
Anesthesia Evaluation  Patient identified by MRN, date of birth, ID band Patient awake    Reviewed: Allergy & Precautions, NPO status , Patient's Chart, lab work & pertinent test results  History of Anesthesia Complications (+) PONV and history of anesthetic complications  Airway Mallampati: II  TM Distance: >3 FB Neck ROM: Full    Dental no notable dental hx. (+) Dental Advisory Given   Pulmonary sleep apnea ,  S/p thoracotomy carcinoid   Pulmonary exam normal        Cardiovascular negative cardio ROS Normal cardiovascular exam     Neuro/Psych negative neurological ROS     GI/Hepatic Neg liver ROS, GERD  ,  Endo/Other  negative endocrine ROS  Renal/GU negative Renal ROS     Musculoskeletal negative musculoskeletal ROS (+)   Abdominal   Peds  Hematology negative hematology ROS (+)   Anesthesia Other Findings Day of surgery medications reviewed with the patient.  Reproductive/Obstetrics                            Anesthesia Physical Anesthesia Plan  ASA: III  Anesthesia Plan: Spinal   Post-op Pain Management:  Regional for Post-op pain   Induction:   PONV Risk Score and Plan: 2 and Ondansetron and Propofol infusion  Airway Management Planned: Natural Airway  Additional Equipment:   Intra-op Plan:   Post-operative Plan:   Informed Consent: I have reviewed the patients History and Physical, chart, labs and discussed the procedure including the risks, benefits and alternatives for the proposed anesthesia with the patient or authorized representative who has indicated his/her understanding and acceptance.     Dental advisory given  Plan Discussed with:   Anesthesia Plan Comments: (Pt recently underwent Right VATS with resection of intrapulmonary lymph node. Per Dr. Everrett Coombe followup notes, pt recovering well from surgery. He was diagnosed with atypical carcinoid and  requires no further treatment at this time. He is undergoing surveillance CT scans.  Prior to VATS, pt had preoperative cardiology evaluation by Dr. Percival Spanish on 01/14/19 states, "PREOP CARDIOVASCULAR:The patient has a very high functional level. He is going for a low risk procedure even if he were to have wedge resection. He has no symptoms. He has no high risk cardiovascular findings. Given this and according to ACC/AHA guidelines no further cardiovascular testing is indicated."  Proep labs reviewed, unremarkable.  EKG: 01/14/19: NSR. LAD.  CHEST - 2 VIEW 03/26/19:  COMPARISON:  Radiographs of February 02, 2019.  FINDINGS: The heart size and mediastinal contours are within normal limits. Both lungs are clear. No pneumothorax or pleural effusion is noted. The visualized skeletal structures are unremarkable.  IMPRESSION: No active cardiopulmonary disease.  PFTs 01/14/19: FVC 4.01 (88%), post 4.04 (89%). FEV1 3.40 (101%), post 3.54 (105%). DLCO unc  25.14 (95%).  CT Heart for calcium scoring 12/16/18: IMPRESSION: 1. Coronary calcium score is 87.9 and this is at percentile 48 for patients of the same age, gender and ethnicity. 2. Irregular lobulated lesion in the right lower lobe measuring up to 1.7 cm. This lesion is not well characterized on this noncontrast examination but concerning for a pulmonary neoplasm. Recommend further characterization with a chest CT with IV contrast. 3. Aortic Atherosclerosis (ICD10-I70.0). )      Anesthesia Quick Evaluation

## 2019-07-30 ENCOUNTER — Encounter (HOSPITAL_COMMUNITY): Payer: Self-pay | Admitting: Orthopaedic Surgery

## 2019-07-30 ENCOUNTER — Other Ambulatory Visit: Payer: Self-pay

## 2019-07-30 ENCOUNTER — Encounter (HOSPITAL_COMMUNITY)
Admission: RE | Disposition: A | Discharge status: Home / Self Care | Payer: Self-pay | Source: Home / Self Care | Attending: Orthopaedic Surgery

## 2019-07-30 ENCOUNTER — Ambulatory Visit (HOSPITAL_COMMUNITY): Payer: Medicare Other | Admitting: Physician Assistant

## 2019-07-30 ENCOUNTER — Observation Stay (HOSPITAL_COMMUNITY): Payer: Medicare Other

## 2019-07-30 ENCOUNTER — Observation Stay (HOSPITAL_COMMUNITY)
Admission: RE | Admit: 2019-07-30 | Discharge: 2019-07-31 | Disposition: A | Discharge status: Home / Self Care | Payer: Medicare Other | Attending: Orthopaedic Surgery | Admitting: Orthopaedic Surgery

## 2019-07-30 ENCOUNTER — Ambulatory Visit (HOSPITAL_COMMUNITY): Payer: Medicare Other | Admitting: Anesthesiology

## 2019-07-30 DIAGNOSIS — Z79899 Other long term (current) drug therapy: Secondary | ICD-10-CM | POA: Insufficient documentation

## 2019-07-30 DIAGNOSIS — Z96651 Presence of right artificial knee joint: Secondary | ICD-10-CM

## 2019-07-30 DIAGNOSIS — E785 Hyperlipidemia, unspecified: Secondary | ICD-10-CM | POA: Diagnosis not present

## 2019-07-30 DIAGNOSIS — M1711 Unilateral primary osteoarthritis, right knee: Principal | ICD-10-CM | POA: Insufficient documentation

## 2019-07-30 DIAGNOSIS — Z85110 Personal history of malignant carcinoid tumor of bronchus and lung: Secondary | ICD-10-CM | POA: Insufficient documentation

## 2019-07-30 DIAGNOSIS — G4730 Sleep apnea, unspecified: Secondary | ICD-10-CM | POA: Diagnosis not present

## 2019-07-30 DIAGNOSIS — K219 Gastro-esophageal reflux disease without esophagitis: Secondary | ICD-10-CM | POA: Insufficient documentation

## 2019-07-30 HISTORY — PX: TOTAL KNEE ARTHROPLASTY: SHX125

## 2019-07-30 HISTORY — DX: Unilateral primary osteoarthritis, right knee: M17.11

## 2019-07-30 SURGERY — ARTHROPLASTY, KNEE, TOTAL
Anesthesia: Monitor Anesthesia Care | Site: Knee | Laterality: Right

## 2019-07-30 MED ORDER — SODIUM CHLORIDE 0.9 % IV SOLN: INTRAVENOUS | Status: DC

## 2019-07-30 MED ORDER — FENTANYL CITRATE (PF) 250 MCG/5ML IJ SOLN
INTRAMUSCULAR | Status: AC
  Filled 2019-07-30: qty 5

## 2019-07-30 MED ORDER — MAGNESIUM CITRATE PO SOLN: 1.0000 | Freq: Once | ORAL | Status: DC | PRN

## 2019-07-30 MED ORDER — DOCUSATE SODIUM 100 MG PO CAPS
100.0000 mg | ORAL_CAPSULE | Freq: Two times a day (BID) | ORAL | Status: DC
  Administered 2019-07-30 – 2019-07-31 (×2): 100 mg via ORAL
  Filled 2019-07-30 (×3): qty 1

## 2019-07-30 MED ORDER — CELECOXIB 200 MG PO CAPS
ORAL_CAPSULE | ORAL | Status: AC
  Administered 2019-07-30: 06:00:00 400 mg via ORAL
  Filled 2019-07-30: qty 2

## 2019-07-30 MED ORDER — GABAPENTIN 100 MG PO CAPS
100.0000 mg | ORAL_CAPSULE | Freq: Three times a day (TID) | ORAL | Status: DC
  Administered 2019-07-30 – 2019-07-31 (×5): 100 mg via ORAL
  Filled 2019-07-30 (×5): qty 1

## 2019-07-30 MED ORDER — CHLORHEXIDINE GLUCONATE 4 % EX LIQD: 60.0000 mL | Freq: Once | CUTANEOUS | Status: DC

## 2019-07-30 MED ORDER — ONDANSETRON HCL 4 MG PO TABS
4.0000 mg | ORAL_TABLET | Freq: Four times a day (QID) | ORAL | Status: DC | PRN
  Administered 2019-07-31: 4 mg via ORAL
  Filled 2019-07-30: qty 1

## 2019-07-30 MED ORDER — SORBITOL 70 % SOLN: 30.0000 mL | Freq: Every day | Status: DC | PRN

## 2019-07-30 MED ORDER — OXYCODONE HCL 5 MG PO TABS
5.0000 mg | ORAL_TABLET | ORAL | Status: DC | PRN
  Administered 2019-07-31: 13:00:00 10 mg via ORAL
  Filled 2019-07-30 (×2): qty 2

## 2019-07-30 MED ORDER — PROPOFOL 500 MG/50ML IV EMUL
INTRAVENOUS | Status: DC | PRN
  Administered 2019-07-30: 35 ug/kg/min via INTRAVENOUS

## 2019-07-30 MED ORDER — METOCLOPRAMIDE HCL 5 MG PO TABS: 5.0000 mg | ORAL_TABLET | Freq: Three times a day (TID) | ORAL | Status: DC | PRN

## 2019-07-30 MED ORDER — MENTHOL 3 MG MT LOZG: 1.0000 | LOZENGE | OROMUCOSAL | Status: DC | PRN

## 2019-07-30 MED ORDER — PHENYLEPHRINE 40 MCG/ML (10ML) SYRINGE FOR IV PUSH (FOR BLOOD PRESSURE SUPPORT)
PREFILLED_SYRINGE | INTRAVENOUS | Status: DC | PRN
  Administered 2019-07-30: 80 ug via INTRAVENOUS
  Administered 2019-07-30: 40 ug via INTRAVENOUS
  Administered 2019-07-30: 80 ug via INTRAVENOUS

## 2019-07-30 MED ORDER — ACETAMINOPHEN 325 MG PO TABS: 325.0000 mg | ORAL_TABLET | Freq: Four times a day (QID) | ORAL | Status: DC | PRN

## 2019-07-30 MED ORDER — MIDAZOLAM HCL 2 MG/2ML IJ SOLN
INTRAMUSCULAR | Status: AC
  Filled 2019-07-30: qty 2

## 2019-07-30 MED ORDER — DIPHENHYDRAMINE HCL 12.5 MG/5ML PO ELIX: 12.5000 mg | ORAL_SOLUTION | ORAL | Status: DC | PRN

## 2019-07-30 MED ORDER — MIDAZOLAM HCL 2 MG/2ML IJ SOLN: 1.0000 mg | INTRAMUSCULAR | Status: DC | PRN

## 2019-07-30 MED ORDER — CEFAZOLIN SODIUM-DEXTROSE 2-4 GM/100ML-% IV SOLN
2.0000 g | INTRAVENOUS | Status: AC
  Administered 2019-07-30: 08:00:00 2 g via INTRAVENOUS

## 2019-07-30 MED ORDER — METHOCARBAMOL 1000 MG/10ML IJ SOLN
500.0000 mg | Freq: Four times a day (QID) | INTRAVENOUS | Status: DC | PRN
  Filled 2019-07-30: qty 5

## 2019-07-30 MED ORDER — TRANEXAMIC ACID-NACL 1000-0.7 MG/100ML-% IV SOLN
INTRAVENOUS | Status: AC
  Filled 2019-07-30: qty 100

## 2019-07-30 MED ORDER — ONDANSETRON HCL 4 MG/2ML IJ SOLN
4.0000 mg | Freq: Four times a day (QID) | INTRAMUSCULAR | Status: DC | PRN
  Administered 2019-07-30 – 2019-07-31 (×2): 4 mg via INTRAVENOUS
  Filled 2019-07-30 (×2): qty 2

## 2019-07-30 MED ORDER — ACETAMINOPHEN 500 MG PO TABS: 1000.0000 mg | ORAL_TABLET | Freq: Once | ORAL | Status: AC

## 2019-07-30 MED ORDER — CEFAZOLIN SODIUM-DEXTROSE 1-4 GM/50ML-% IV SOLN
1.0000 g | Freq: Four times a day (QID) | INTRAVENOUS | Status: AC
  Administered 2019-07-30 (×2): 1 g via INTRAVENOUS
  Filled 2019-07-30 (×2): qty 50

## 2019-07-30 MED ORDER — CELECOXIB 200 MG PO CAPS: 400.0000 mg | ORAL_CAPSULE | Freq: Once | ORAL | Status: AC

## 2019-07-30 MED ORDER — PROPOFOL 10 MG/ML IV BOLUS
INTRAVENOUS | Status: AC
  Filled 2019-07-30: qty 40

## 2019-07-30 MED ORDER — PHENOL 1.4 % MT LIQD: 1.0000 | OROMUCOSAL | Status: DC | PRN

## 2019-07-30 MED ORDER — TRANEXAMIC ACID-NACL 1000-0.7 MG/100ML-% IV SOLN
1000.0000 mg | INTRAVENOUS | Status: AC
  Administered 2019-07-30: 08:00:00 1000 mg via INTRAVENOUS

## 2019-07-30 MED ORDER — ASPIRIN EC 325 MG PO TBEC
325.0000 mg | DELAYED_RELEASE_TABLET | Freq: Two times a day (BID) | ORAL | Status: DC
  Administered 2019-07-30 – 2019-07-31 (×3): 325 mg via ORAL
  Filled 2019-07-30 (×4): qty 1

## 2019-07-30 MED ORDER — FENTANYL CITRATE (PF) 100 MCG/2ML IJ SOLN: 25.0000 ug | INTRAMUSCULAR | Status: DC | PRN

## 2019-07-30 MED ORDER — METOCLOPRAMIDE HCL 5 MG/ML IJ SOLN
5.0000 mg | Freq: Three times a day (TID) | INTRAMUSCULAR | Status: DC | PRN
  Administered 2019-07-30: 5 mg via INTRAVENOUS
  Filled 2019-07-30: qty 2

## 2019-07-30 MED ORDER — METHOCARBAMOL 500 MG PO TABS
500.0000 mg | ORAL_TABLET | Freq: Four times a day (QID) | ORAL | Status: DC | PRN
  Administered 2019-07-30 – 2019-07-31 (×3): 500 mg via ORAL
  Filled 2019-07-30 (×3): qty 1

## 2019-07-30 MED ORDER — ROSUVASTATIN CALCIUM 5 MG PO TABS
10.0000 mg | ORAL_TABLET | Freq: Every day | ORAL | Status: DC
  Administered 2019-07-30 – 2019-07-31 (×2): 10 mg via ORAL
  Filled 2019-07-30 (×2): qty 2

## 2019-07-30 MED ORDER — ACETAMINOPHEN 500 MG PO TABS
ORAL_TABLET | ORAL | Status: AC
  Administered 2019-07-30: 06:00:00 1000 mg via ORAL
  Filled 2019-07-30: qty 2

## 2019-07-30 MED ORDER — POLYETHYLENE GLYCOL 3350 17 G PO PACK: 17.0000 g | PACK | Freq: Every day | ORAL | Status: DC | PRN

## 2019-07-30 MED ORDER — LACTATED RINGERS IV SOLN: INTRAVENOUS | Status: DC | PRN

## 2019-07-30 MED ORDER — FENTANYL CITRATE (PF) 100 MCG/2ML IJ SOLN: 50.0000 ug | INTRAMUSCULAR | Status: DC | PRN

## 2019-07-30 MED ORDER — CEFAZOLIN SODIUM-DEXTROSE 2-4 GM/100ML-% IV SOLN
INTRAVENOUS | Status: AC
  Filled 2019-07-30: qty 100

## 2019-07-30 MED ORDER — 0.9 % SODIUM CHLORIDE (POUR BTL) OPTIME
TOPICAL | Status: DC | PRN
  Administered 2019-07-30: 1000 mL

## 2019-07-30 MED ORDER — KETOROLAC TROMETHAMINE 15 MG/ML IJ SOLN
7.5000 mg | Freq: Four times a day (QID) | INTRAMUSCULAR | Status: AC
  Administered 2019-07-30 – 2019-07-31 (×4): 7.5 mg via INTRAVENOUS
  Filled 2019-07-30 (×4): qty 1

## 2019-07-30 MED ORDER — ONDANSETRON HCL 4 MG/2ML IJ SOLN
INTRAMUSCULAR | Status: AC
  Filled 2019-07-30: qty 2

## 2019-07-30 MED ORDER — MIDAZOLAM HCL 5 MG/5ML IJ SOLN
INTRAMUSCULAR | Status: DC | PRN
  Administered 2019-07-30: 2 mg via INTRAVENOUS

## 2019-07-30 MED ORDER — ONDANSETRON HCL 4 MG/2ML IJ SOLN
INTRAMUSCULAR | Status: DC | PRN
  Administered 2019-07-30: 4 mg via INTRAVENOUS

## 2019-07-30 MED ORDER — POVIDONE-IODINE 10 % EX SWAB: 2.0000 "application " | Freq: Once | CUTANEOUS | Status: DC

## 2019-07-30 MED ORDER — HYDROMORPHONE HCL 1 MG/ML IJ SOLN
0.5000 mg | INTRAMUSCULAR | Status: DC | PRN
  Administered 2019-07-30: 1 mg via INTRAVENOUS
  Administered 2019-07-31: 05:00:00 0.5 mg via INTRAVENOUS
  Filled 2019-07-30 (×2): qty 1

## 2019-07-30 MED ORDER — PANTOPRAZOLE SODIUM 40 MG PO TBEC
40.0000 mg | DELAYED_RELEASE_TABLET | Freq: Every day | ORAL | Status: DC
  Administered 2019-07-30 – 2019-07-31 (×2): 40 mg via ORAL
  Filled 2019-07-30 (×2): qty 1

## 2019-07-30 MED ORDER — OXYCODONE HCL 5 MG PO TABS
10.0000 mg | ORAL_TABLET | ORAL | Status: DC | PRN
  Administered 2019-07-30: 18:00:00 15 mg via ORAL
  Administered 2019-07-30: 13:00:00 10 mg via ORAL
  Administered 2019-07-31: 09:00:00 15 mg via ORAL
  Filled 2019-07-30 (×2): qty 3

## 2019-07-30 MED ORDER — PROPOFOL 10 MG/ML IV BOLUS
INTRAVENOUS | Status: DC | PRN
  Administered 2019-07-30: 20 mg via INTRAVENOUS

## 2019-07-30 MED ORDER — BUPIVACAINE HCL (PF) 0.75 % IJ SOLN
INTRAMUSCULAR | Status: DC | PRN
  Administered 2019-07-30: 20 mg

## 2019-07-30 MED ORDER — PROMETHAZINE HCL 25 MG/ML IJ SOLN: 6.2500 mg | INTRAMUSCULAR | Status: DC | PRN

## 2019-07-30 MED ORDER — LACTATED RINGERS IV SOLN: INTRAVENOUS | Status: DC

## 2019-07-30 MED ORDER — FENTANYL CITRATE (PF) 250 MCG/5ML IJ SOLN
INTRAMUSCULAR | Status: DC | PRN
  Administered 2019-07-30: 25 ug via INTRAVENOUS
  Administered 2019-07-30: 50 ug via INTRAVENOUS
  Administered 2019-07-30: 25 ug via INTRAVENOUS
  Administered 2019-07-30: 100 ug via INTRAVENOUS
  Administered 2019-07-30 (×2): 25 ug via INTRAVENOUS

## 2019-07-30 SURGICAL SUPPLY — 57 items
BANDAGE ESMARK 6X9 LF (GAUZE/BANDAGES/DRESSINGS) ×1 IMPLANT
BLADE SAG 18X100X1.27 (BLADE) ×5 IMPLANT
BNDG CMPR 9X6 STRL LF SNTH (GAUZE/BANDAGES/DRESSINGS) ×1
BNDG CMPR MED 10X6 ELC LF (GAUZE/BANDAGES/DRESSINGS) ×1
BNDG ELASTIC 6X10 VLCR STRL LF (GAUZE/BANDAGES/DRESSINGS) ×2 IMPLANT
BNDG ESMARK 6X9 LF (GAUZE/BANDAGES/DRESSINGS) ×3
BSPLAT TIB 5 KN TRITANIUM (Knees) ×1 IMPLANT
CLOSURE STERI-STRIP 1/2X4 (GAUZE/BANDAGES/DRESSINGS) ×1
CLSR STERI-STRIP ANTIMIC 1/2X4 (GAUZE/BANDAGES/DRESSINGS) ×1 IMPLANT
COVER SURGICAL LIGHT HANDLE (MISCELLANEOUS) ×3 IMPLANT
CUFF TOURN SGL QUICK 34 (TOURNIQUET CUFF) ×3
CUFF TRNQT CYL 34X4.125X (TOURNIQUET CUFF) ×1 IMPLANT
DRAPE EXTREMITY T 121X128X90 (DISPOSABLE) ×3 IMPLANT
DRAPE HALF SHEET 40X57 (DRAPES) ×3 IMPLANT
DRAPE U-SHAPE 47X51 STRL (DRAPES) ×3 IMPLANT
DURAPREP 26ML APPLICATOR (WOUND CARE) ×5 IMPLANT
ELECT CAUTERY BLADE 6.4 (BLADE) ×3 IMPLANT
ELECT REM PT RETURN 9FT ADLT (ELECTROSURGICAL) ×3
ELECTRODE REM PT RTRN 9FT ADLT (ELECTROSURGICAL) ×1 IMPLANT
FACESHIELD WRAPAROUND (MASK) ×6 IMPLANT
FACESHIELD WRAPAROUND OR TEAM (MASK) ×2 IMPLANT
FEMORAL POSTERIOR SZ5 RT (Femur) IMPLANT
GAUZE SPONGE 4X4 12PLY STRL LF (GAUZE/BANDAGES/DRESSINGS) ×2 IMPLANT
GAUZE XEROFORM 1X8 LF (GAUZE/BANDAGES/DRESSINGS) ×3 IMPLANT
GLOVE BIOGEL PI IND STRL 6.5 (GLOVE) IMPLANT
GLOVE BIOGEL PI IND STRL 8 (GLOVE) ×2 IMPLANT
GLOVE BIOGEL PI INDICATOR 6.5 (GLOVE) ×4
GLOVE BIOGEL PI INDICATOR 8 (GLOVE) ×4
GLOVE ORTHO TXT STRL SZ7.5 (GLOVE) ×3 IMPLANT
GLOVE SURG ORTHO 8.0 STRL STRW (GLOVE) ×9 IMPLANT
GOWN STRL REUS W/ TWL LRG LVL3 (GOWN DISPOSABLE) IMPLANT
GOWN STRL REUS W/ TWL XL LVL3 (GOWN DISPOSABLE) ×2 IMPLANT
GOWN STRL REUS W/TWL LRG LVL3 (GOWN DISPOSABLE) ×6
GOWN STRL REUS W/TWL XL LVL3 (GOWN DISPOSABLE) ×6
INSERT TIB PS SZ5 11 KNEE (Miscellaneous) ×2 IMPLANT
KIT BASIN OR (CUSTOM PROCEDURE TRAY) ×3 IMPLANT
KIT TURNOVER KIT B (KITS) ×3 IMPLANT
KNEE PATELLA ASYMMETRIC 10X35 (Knees) ×2 IMPLANT
KNEE TIBIAL COMPONENT SZ5 (Knees) ×2 IMPLANT
MANIFOLD NEPTUNE II (INSTRUMENTS) ×3 IMPLANT
NS IRRIG 1000ML POUR BTL (IV SOLUTION) ×3 IMPLANT
PACK TOTAL JOINT (CUSTOM PROCEDURE TRAY) ×3 IMPLANT
PAD ABD 8X10 STRL (GAUZE/BANDAGES/DRESSINGS) ×4 IMPLANT
PAD ARMBOARD 7.5X6 YLW CONV (MISCELLANEOUS) ×3 IMPLANT
PADDING CAST COTTON 6X4 STRL (CAST SUPPLIES) ×4 IMPLANT
PIN FLUTED HEDLESS FIX 3.5X1/8 (PIN) ×2 IMPLANT
POSTERIOR FEMORAL SZ5 RT (Femur) ×3 IMPLANT
SET PAD KNEE POSITIONER (MISCELLANEOUS) ×3 IMPLANT
SUT MNCRL AB 4-0 PS2 18 (SUTURE) ×2 IMPLANT
SUT VIC AB 0 CT1 27 (SUTURE) ×3
SUT VIC AB 0 CT1 27XBRD ANBCTR (SUTURE) ×1 IMPLANT
SUT VIC AB 1 CT1 27 (SUTURE) ×6
SUT VIC AB 1 CT1 27XBRD ANBCTR (SUTURE) ×2 IMPLANT
SUT VIC AB 2-0 CT1 27 (SUTURE) ×6
SUT VIC AB 2-0 CT1 TAPERPNT 27 (SUTURE) ×2 IMPLANT
TOWEL GREEN STERILE (TOWEL DISPOSABLE) ×3 IMPLANT
TOWEL GREEN STERILE FF (TOWEL DISPOSABLE) ×3 IMPLANT

## 2019-07-30 NOTE — Progress Notes (Signed)
Orthopedic Tech Progress Note Patient Details:  [name] Sep 25, 1950 [id number] Applied down in PACU. Patient was not able to get up to 90 degrees but he was able to get up to 70. Told him to work at it and soon he'll be able to reach 12. CPM Right Knee CPM Right Knee: On Right Knee Flexion (Degrees): 0 Right Knee Extension (Degrees): 70 Additional Comments: added ice  Post Interventions Patient Tolerated: Well Instructions Provided: Care of device, Adjustment of device  Janit Pagan 07/30/2019, 10:31 AM

## 2019-07-30 NOTE — Brief Op Note (Signed)
07/30/2019  8:58 AM  PATIENT:  [name]  69 y.o. male  PRE-OPERATIVE DIAGNOSIS:  right knee osteoarthritis  POST-OPERATIVE DIAGNOSIS:  right knee osteoarthritis  PROCEDURE:  Procedure(s): RIGHT TOTAL KNEE ARTHROPLASTY (Right)  SURGEON:  Surgeon(s) and Role:    Mcarthur Rossetti, MD - Primary  PHYSICIAN ASSISTANT:  Benita Stabile, PA-C  ANESTHESIA:   regional and spinal  EBL:  50 mL   COUNTS:  YES  TOURNIQUET:  * Missing tourniquet times found for documented tourniquets in log: [id number] *  DICTATION: .Other Dictation: Dictation Number [id number]  PLAN OF CARE: Admit for overnight observation  PATIENT DISPOSITION:  PACU - hemodynamically stable.   Delay start of Pharmacological VTE agent (>24hrs) due to surgical blood loss or risk of bleeding: no

## 2019-07-30 NOTE — Plan of Care (Signed)

## 2019-07-30 NOTE — Anesthesia Postprocedure Evaluation (Signed)
Anesthesia Post Note  Patient: [name]  Procedure(s) Performed: RIGHT TOTAL KNEE ARTHROPLASTY (Right Knee)     Patient location during evaluation: PACU Anesthesia Type: Regional and Spinal Level of consciousness: awake and alert Pain management: pain level controlled Vital Signs Assessment: post-procedure vital signs reviewed and stable Respiratory status: spontaneous breathing and respiratory function stable Cardiovascular status: blood pressure returned to baseline and stable Postop Assessment: spinal receding Anesthetic complications: no    Last Vitals:  Vitals:   07/30/19 0954 07/30/19 1029  BP:  113/77  Pulse: (!) 54 63  Resp:  18  Temp: (!) 36.1 C 36.5 C  SpO2: 95% 100%    Last Pain:  Vitals:   07/30/19 1029  TempSrc: Oral  PainSc:                  [name]

## 2019-07-30 NOTE — Anesthesia Procedure Notes (Signed)
Anesthesia Regional Block: Adductor canal block   Pre-Anesthetic Checklist: ,, timeout performed, Correct Patient, Correct Site, Correct Laterality, Correct Procedure, Correct Position, site marked, Risks and benefits discussed,  Surgical consent,  Pre-op evaluation,  At surgeon's request and post-op pain management  Laterality: Right  Prep: chloraprep       Needles:  Injection technique: Single-shot  Needle Type: Stimulator Needle - 80     Needle Length: 10cm  Needle Gauge: 21     Additional Needles:   Narrative:  Start time: 07/30/2019 6:48 AM End time: 07/30/2019 6:58 AM Injection made incrementally with aspirations every 5 mL.  Performed by: Personally

## 2019-07-30 NOTE — Transfer of Care (Signed)
Immediate Anesthesia Transfer of Care Note  Patient: [name]  Procedure(s) Performed: RIGHT TOTAL KNEE ARTHROPLASTY (Right Knee)  Patient Location: PACU  Anesthesia Type:MAC, Regional and Spinal  Level of Consciousness: awake, alert , oriented and sedated  Airway & Oxygen Therapy: Patient Spontanous Breathing and Patient connected to nasal cannula oxygen  Post-op Assessment: Report given to RN, Post -op Vital signs reviewed and stable and Patient moving all extremities  Post vital signs: Reviewed and stable  Last Vitals:  Vitals Value Taken Time  BP 104/68 07/30/19 0929  Temp    Pulse 57 07/30/19 0932  Resp 11 07/30/19 0932  SpO2 97 % 07/30/19 0932  Vitals shown include unvalidated device data.  Last Pain:  Vitals:   07/30/19 0602  TempSrc:   PainSc: 0-No pain      Patients Stated Pain Goal: 4 (38/10/17 5102)  Complications: No apparent anesthesia complications

## 2019-07-30 NOTE — Evaluation (Signed)
Physical Therapy Evaluation Patient Details Name: [name] MRN: [id number] DOB: [birth year] Today's Date: 07/30/2019   History of Present Illness  69 yo male s/p R TKR on 07/30/19. PMH includes OA, lung cancer s/p resection 2020, GERD, HLD.  Clinical Impression   Pt presents with RLE pain, difficulty performing bed mobility, post-operative RLE weakness and decreased ROM, and decreased activity tolerance. Pt to benefit from acute PT to address deficits. Pt ambulated hallway distance with RW with min guard assist, verbal cuing for form and safety provided throughout. Pt educated on ankle pumps to perform this afternoon/evening to increase circulation, to pt's tolerance and limited by pain. PT to progress mobility as tolerated, and will continue to follow acutely.         Follow Up Recommendations Home health PT;Supervision for mobility/OOB    Equipment Recommendations  3in1 (PT)(per pt, he is going to borrow a RW from friend (2 front wheel per report))    Recommendations for Other Services       Precautions / Restrictions Precautions Precautions: Fall Restrictions Weight Bearing Restrictions: No Other Position/Activity Restrictions: WBAT      Mobility  Bed Mobility Overal bed mobility: Needs Assistance Bed Mobility: Supine to Sit     Supine to sit: Min assist;HOB elevated     General bed mobility comments: Min assist for RLE lifting and translation to EOB, increased time and effort with use of bedrails.  Transfers Overall transfer level: Needs assistance Equipment used: Rolling walker (2 wheeled) Transfers: Sit to/from Stand Sit to Stand: Min assist;From elevated surface         General transfer comment: Min assist for power up, steadying. Verbal cuing for hand placement, weight shifting and pre-gait prior to ambulation for pt safety and R knee stability testing.  Ambulation/Gait Ambulation/Gait assistance: Min guard;+2 safety/equipment(wife with chair  follow) Gait Distance (Feet): 120 Feet Assistive device: Rolling walker (2 wheeled) Gait Pattern/deviations: Step-to pattern;Step-through pattern;Decreased stride length;Trunk flexed Gait velocity: decr   General Gait Details: min guard for safety, verbal cuing for sequencing, placement in RW, turning.  Stairs            Wheelchair Mobility    Modified Rankin (Stroke Patients Only)       Balance Overall balance assessment: Mild deficits observed, not formally tested                                           Pertinent Vitals/Pain Pain Assessment: 0-10 Pain Score: 7  Pain Location: R knee Pain Descriptors / Indicators: Sore;Discomfort Pain Intervention(s): Limited activity within patient's tolerance;Monitored during session;Repositioned    Home Living Family/patient expects to be discharged to:: Private residence Living Arrangements: Spouse/significant other Available Help at Discharge: Family;Available 24 hours/day Type of Home: House Home Access: Stairs to enter;Level entry Entrance Stairs-Rails: None Entrance Stairs-Number of Steps: one way, there are a few steps. Through carport way, there are no steps, followed by 3 steps Home Layout: Other (Comment)(3 steps to den, 3 steps to bedroom with no railing) Home Equipment: None      Prior Function Level of Independence: Independent         Comments: Pt reports he enjoys golfing and gardening     Hand Dominance   Dominant Hand: Right    Extremity/Trunk Assessment   Upper Extremity Assessment Upper Extremity Assessment: Overall WFL for tasks assessed    Lower Extremity  Assessment Lower Extremity Assessment: Overall WFL for tasks assessed;RLE deficits/detail RLE Deficits / Details: anticipated post-surgical weakness; able to perform ankle pumps, quad set, heel slide to 60*, SLR with mod lift assist from PT RLE Sensation: WNL    Cervical / Trunk Assessment Cervical / Trunk Assessment:  Normal  Communication   Communication: No difficulties  Cognition Arousal/Alertness: Awake/alert Behavior During Therapy: WFL for tasks assessed/performed Overall Cognitive Status: Within Functional Limits for tasks assessed                                        General Comments      Exercises Total Joint Exercises Heel Slides: 5 reps;Right;AROM;Supine   Assessment/Plan    PT Assessment Patient needs continued PT services  PT Problem List Decreased strength;Decreased mobility;Decreased range of motion;Decreased activity tolerance;Decreased balance;Decreased knowledge of use of DME;Pain       PT Treatment Interventions DME instruction;Therapeutic activities;Gait training;Therapeutic exercise;Patient/family education;Balance training;Stair training;Functional mobility training    PT Goals (Current goals can be found in the Care Plan section)  Acute Rehab PT Goals Patient Stated Goal: return to PLOF PT Goal Formulation: With patient Time For Goal Achievement: 08/06/19 Potential to Achieve Goals: Good    Frequency 7X/week   Barriers to discharge        Co-evaluation               AM-PAC PT "6 Clicks" Mobility  Outcome Measure Help needed turning from your back to your side while in a flat bed without using bedrails?: A Little Help needed moving from lying on your back to sitting on the side of a flat bed without using bedrails?: A Little Help needed moving to and from a bed to a chair (including a wheelchair)?: A Little Help needed standing up from a chair using your arms (e.g., wheelchair or bedside chair)?: A Little Help needed to walk in hospital room?: A Little Help needed climbing 3-5 steps with a railing? : A Lot 6 Click Score: 17    End of Session Equipment Utilized During Treatment: Gait belt Activity Tolerance: Patient tolerated treatment well;Patient limited by pain Patient left: in chair;with call bell/phone within reach;with  family/visitor present Nurse Communication: Mobility status;Other (comment)(chair alarm batteries are empty) PT Visit Diagnosis: Other abnormalities of gait and mobility (R26.89);Difficulty in walking, not elsewhere classified (R26.2)    Time: 1630-1700 PT Time Calculation (min) (ACUTE ONLY): 30 min   Charges:   PT Evaluation $PT Eval Low Complexity: 1 Low PT Treatments $Gait Training: 8-22 mins       Tage Feggins E, PT Acute Rehabilitation Services Pager [phone]  Office [place] 07/30/2019, 5:34 PM

## 2019-07-30 NOTE — Op Note (Signed)
[name], [name] MEDICAL RECORD [id number] ACCOUNT [id number] DATE OF BIRTH:[birth year] FACILITY: [facility] LOCATION: [facility] PHYSICIAN:Jasslyn Finkel Kerry Fort, MD  OPERATIVE REPORT  DATE OF PROCEDURE:  07/30/2019  PREOPERATIVE DIAGNOSIS:  Primary osteoarthritis and degenerative joint disease, right knee.  POSTOPERATIVE DIAGNOSIS:  Primary osteoarthritis and degenerative joint disease, right knee.  PROCEDURE:  Right total knee arthroplasty.  IMPLANTS:  [employer]ker Triathlon press-fit knee system with size 5 femur, size 5 tibial tray, 11 mm fixed bearing polyethylene insert, size 35 patellar button.  SURGEON:  Lind Guest. Ninfa Linden, MD  ASSISTANT:  Erskine Emery, PA-C  ANESTHESIA: 1.  Right lower extremity adductor canal block 2.  Spinal.  ANTIBIOTICS:  Two g IV Ancef.  ESTIMATED BLOOD LOSS:  Less  than 100 mL.  TOURNIQUET TIME:  Less than 1 hour.  COMPLICATIONS:  None.  INDICATIONS:  The patient is a 69 year old very active and young appearing individual with debilitating arthritis involving his right knee.  He has had a history of multiple effusions of that knee.  He has had steroid injections and hyaluronic acid.  He  has had remote history of a right knee arthroscopy.  At this point, his right knee pain is daily and it is definitely detrimentally affecting his mobility, his quality of life, and his activities of daily living to the point he does wish to proceed with  a total knee arthroplasty and we have recommended this to him as well based on his clinical exam findings and x-ray findings and the failure of conservative treatment.  With this surgery, he understands there is risk of acute blood loss anemia, nerve or  vessel injury, fracture, infection, DVT, and implant failure.  He understands our goals are to decrease pain, improve mobility and overall improve quality of life.  DESCRIPTION OF PROCEDURE:  After informed consent was obtained, the appropriate right knee was  marked and an adductor canal block was obtained in the right lower extremity in the holding room.  He was then brought to the operating room and sat up on the  operating table where a spinal anesthesia was obtained.  He was then laid in the supine position and a nonsterile tourniquet was placed around his upper right thigh.  His right thigh, knee, leg, ankle, foot were prepped and draped with DuraPrep and  sterile drapes including a sterile stockinette.  A time-out was called and he was identified as correct patient, correct right knee.  We then used an Esmarch to wrap that leg and tourniquet inflated to 300 mm of pressure.  We then made a direct midline  incision over the patella and carried this proximally and distally.  We dissected down to the knee joint and carried out a medial parapatellar arthrotomy, finding a very large joint effusion and significant periarticular osteophytes and cartilage wear  throughout the knee.  With the knee in a flexed position, we removed remnants of ACL, PCL, medial and lateral meniscus.  We used extramedullary cutting guide for making our proximal tibia cut, correcting for varus and valgus and neutral slope and setting  this cut to take 9 mm off the high side.  We took this cut without difficulty.  We then went to the femur for our distal femoral cut, using intramedullary guide through the notch of the femur, setting our distal femoral cutting guide for right knee at 5  degrees externally rotated and 10 mm distal femoral cut.  We made this cut without difficulty and brought the knee back down to full extension,  with a 9 mm extension block had achieved full extension.  We then went to our femoral sizing guide based off  the epicondylar axis and Whiteside line.  Based off this, we chose a size 5 femur.  We put a 4-in-1 cutting block for size 5 femur, made our anterior and posterior cuts, followed by our chamfer cuts.  We then made our femoral box cut.  Of note, he seemed  to  have very good bone quality, so we felt comfortable with proceeding with a press-fit knee system.  We then went back to the tibia and chose a size 5 tibial tray for coverage, setting the rotation off the tibial tubercle and the femur.  We made our  keel punch off a size 5 tibial tray with a press-fit keel punch.  With a size 5 tibial tray trial and the size 5 right femur trial, we tried a 9 and then we felt more comfortable with an 11 mm fixed bearing polyethylene insert.  We put the knee through  range of motion.  We were pleased with stability with varus and valgus stressing as well.  We then made our patellar cut and drilled 3 holes for a size 35 press-fit patellar button.  We then removed all trial components from the knee and irrigated the  knee with normal saline solution using pulsatile lavage.  We dried the knee very well and then with the knee in a flexed position, placed our press-fit tibial tray after using our finishing block on the tibia for a size 5 tibia.  Once we placed the real  size 5 tibia, we placed the real size 5 right femur and our real 11 mm fixed bearing polyethylene insert and then press-fit our patellar button.  Again, we put the knee through cycles of range of motion.  We were pleased with stability.  We then let the  tourniquet down and hemostasis obtained with electrocautery.  We closed the arthrotomy with interrupted #1 Vicryl suture, followed by 0 Vicryl to close the deep tissue, 2-0 Vicryl was used to close the subcutaneous tissue and we used a 4-0 Monocryl  subcuticular stitch and Steri-Strips on the skin.  A well-padded sterile dressing was applied.  He was taken to the recovery room in stable condition.  All final counts were correct.  There were no complications noted.  He will be admitted for overnight  observation.  Of note, Benita Stabile, PA-C, assisted during the entire case.  Her assistance was crucial for facilitating all aspects of this case.  VN/NUANCE  D:07/30/2019  T:07/30/2019 JOB:[id number]

## 2019-07-30 NOTE — H&P (Signed)
TOTAL KNEE ADMISSION H&P  Patient is being admitted for right total knee arthroplasty.  Subjective:  Chief Complaint:right knee pain.  HPI: [name], 69 y.o. male, has a history of pain and functional disability in the right knee due to arthritis and has failed non-surgical conservative treatments for greater than 12 weeks to includeNSAID's and/or analgesics, corticosteriod injections, viscosupplementation injections, flexibility and strengthening excercises and activity modification.  Onset of symptoms was gradual, starting 3 years ago with gradually worsening course since that time. The patient noted prior procedures on the knee to include  arthroscopy on the right knee(s).  Patient currently rates pain in the right knee(s) at 10 out of 10 with activity. Patient has night pain, worsening of pain with activity and weight bearing, pain that interferes with activities of daily living, pain with passive range of motion, crepitus and joint swelling.  Patient has evidence of subchondral sclerosis, periarticular osteophytes and joint space narrowing by imaging studies. There is no active infection.  Patient Active Problem List   Diagnosis Date Noted  . Unilateral primary osteoarthritis, right knee 07/30/2019  . 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 COLONIC POLYPS, ADENOMATOUS 05/24/2008  . DIVERTICULOSIS, COLON 05/24/2008   Past Medical History:  Diagnosis Date  . Arthritis    knee, no meds  . Cancer [facility]) 2020   carcinoid tumor of right lung  . 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 Eczema 08/08/2010  . Family history of adverse reaction to anesthesia    mom- post op N/V  . GERD (gastroesophageal reflux disease)    weight loss resolved gerd, no medication  . Hyperlipidemia   . PONV (postoperative nausea and vomiting)   . Sleep apnea    uses CPAP nightly  . Unilateral primary osteoarthritis, right knee 07/30/2019    Past Surgical History:  Procedure Laterality Date  . COLONOSCOPY      hx polyps  . EYE SURGERY     lasik  . INTERCOSTAL NERVE BLOCK  01/20/2019   Procedure: Intercostal Nerve Block;  Surgeon: Grace Isaac, MD;  Location: [facility];  Service: Thoracic;;  . KNEE ARTHROSCOPY Right   . SEPTOPLASTY  1980   x 2  . TONSILLECTOMY AND ADENOIDECTOMY  1960  . VIDEO ASSISTED THORACOSCOPY (VATS)/WEDGE RESECTION Right 01/20/2019   Procedure: VIDEO ASSISTED THORACOSCOPY (VATS) WITH RESECTION OF RIGHT INTRAPULMONARY LYMPH NODE;  Surgeon: Grace Isaac, MD;  Location: [facility];  Service: Thoracic;  Laterality: Right;  Marland Kitchen VIDEO BRONCHOSCOPY N/A 01/20/2019   Procedure: VIDEO BRONCHOSCOPY;  Surgeon: Grace Isaac, MD;  Location: [facility] OR;  Service: Thoracic;  Laterality: N/A;    Current Facility-Administered Medications  Medication Dose Route Frequency Provider Last Rate Last Admin  . ceFAZolin (ANCEF) 2-4 GM/100ML-% IVPB           . ceFAZolin (ANCEF) IVPB 2g/100 mL premix  2 g Intravenous On Call to OR Pete Pelt, PA-C      . chlorhexidine (HIBICLENS) 4 % liquid 4 application  60 mL Topical Once Erskine Emery W, PA-C      . fentaNYL (SUBLIMAZE) injection 25-50 mcg  25-50 mcg Intravenous Q5 min PRN Duane Boston, MD      . fentaNYL (SUBLIMAZE) injection 50 mcg  50 mcg Intravenous Q5 min PRN Hodierne, Adam, MD      . lactated ringers infusion   Intravenous Continuous Hodierne, Adam, MD      . midazolam (VERSED) injection 1-2 mg  1-2 mg Intravenous Q5 min PRN  Albertha Ghee, MD      . povidone-iodine 10 % swab 2 application  2 application Topical Once Erskine Emery W, PA-C      . promethazine (PHENERGAN) injection 6.25-12.5 mg  6.25-12.5 mg Intravenous Q15 min PRN Duane Boston, MD      . tranexamic acid (CYKLOKAPRON) 1000MG /171mL IVPB           . tranexamic acid (CYKLOKAPRON) IVPB 1,000 mg  1,000 mg Intravenous To OR Pete Pelt, PA-C       Facility-Administered Medications Ordered in Other Encounters  Medication Dose Route Frequency Provider Last Rate Last  Admin  . fentaNYL (SUBLIMAZE) injection   Intravenous Anesthesia Intra-op Orlie Dakin, CRNA   100 mcg at 07/30/19 0654  . lactated ringers infusion   Intravenous Continuous PRN Wilburn Cornelia, CRNA   New Bag at 07/30/19 [phone]  . midazolam (VERSED) 5 MG/5ML injection   Intravenous Anesthesia Intra-op Orlie Dakin, CRNA   2 mg at 07/30/19 [id number]   Allergies  Allergen Reactions  . Codeine Other (See Comments)    headache    Social History   Tobacco Use  . Smoking status: Never Smoker  . Smokeless tobacco: Never Used  Substance Use Topics  . Alcohol use: Yes    Alcohol/week: 10.0 - 14.0 standard drinks    Types: 10 - 14 Glasses of wine per week    Comment: social    Family History  Problem Relation Age of Onset  . Heart disease Mother        CABG age 31s  . Colon cancer Neg Hx   . Stomach cancer Neg Hx      Review of Systems  Musculoskeletal: Positive for joint swelling.  All other systems reviewed and are negative.   Objective:  Physical Exam  Constitutional: He is oriented to person, place, and time. He appears well-developed and well-nourished.  HENT:  Head: Normocephalic and atraumatic.  Eyes: Pupils are equal, round, and reactive to light. EOM are normal.  Cardiovascular: Normal rate.  Respiratory: Effort normal.  GI: Soft.  Musculoskeletal:     Cervical back: Normal range of motion and neck supple.     Right knee: Swelling and bony tenderness present. Decreased range of motion. Tenderness present over the medial joint line and lateral joint line. Abnormal alignment and abnormal meniscus.  Neurological: He is alert and oriented to person, place, and time.  Skin: Skin is warm and dry.  Psychiatric: He has a normal mood and affect.    Vital signs in last 24 hours: Temp:  [98.5 F (36.9 C)] 98.5 F (36.9 C) (02/04 0543) Pulse Rate:  [68] 68 (02/04 0543) Resp:  [18] 18 (02/04 0543) BP: (143)/(71) 143/71 (02/04 0543) SpO2:  [99 %] 99 % (02/04 0543) Weight:   [95.3 kg] 95.3 kg (02/04 0543)  Labs:   Estimated body mass index is 30.13 kg/m as calculated from the following:   Height as of this encounter: 5\' 10"  (1.778 m).   Weight as of this encounter: 95.3 kg.   Imaging Review Plain radiographs demonstrate severe degenerative joint disease of the right knee(s). The overall alignment ismild varus. The bone quality appears to be excellent for age and reported activity level.      Assessment/Plan:  End stage arthritis, right knee   The patient history, physical examination, clinical judgment of the provider and imaging studies are consistent with end stage degenerative joint disease of the right knee(s) and total knee arthroplasty is deemed  medically necessary. The treatment options including medical management, injection therapy arthroscopy and arthroplasty were discussed at length. The risks and benefits of total knee arthroplasty were presented and reviewed. The risks due to aseptic loosening, infection, stiffness, patella tracking problems, thromboembolic complications and other imponderables were discussed. The patient acknowledged the explanation, agreed to proceed with the plan and consent was signed. Patient is being admitted for inpatient treatment for surgery, pain control, PT, OT, prophylactic antibiotics, VTE prophylaxis, progressive ambulation and ADL's and discharge planning. The patient is planning to be discharged home with home health services     Patient's anticipated LOS is less than 2 midnights, meeting these requirements: - Younger than 68 - Lives within 1 hour of care - Has a competent adult at home to recover with post-op recover - NO history of  - Chronic pain requiring opiods  - Diabetes  - Coronary Artery Disease  - Heart failure  - Heart attack  - Stroke  - DVT/VTE  - Cardiac arrhythmia  - Respiratory Failure/COPD  - Renal failure  - Anemia  - Advanced Liver disease

## 2019-07-31 ENCOUNTER — Encounter: Payer: Self-pay | Admitting: *Deleted

## 2019-07-31 DIAGNOSIS — M1711 Unilateral primary osteoarthritis, right knee: Secondary | ICD-10-CM | POA: Diagnosis not present

## 2019-07-31 LAB — CBC
HCT: 35.1 % — ABNORMAL LOW (ref 39.0–52.0)
Hemoglobin: 11.7 g/dL — ABNORMAL LOW (ref 13.0–17.0)
MCH: 30.9 pg (ref 26.0–34.0)
MCHC: 33.3 g/dL (ref 30.0–36.0)
MCV: 92.6 fL (ref 80.0–100.0)
Platelets: 179 10*3/uL (ref 150–400)
RBC: 3.79 MIL/uL — ABNORMAL LOW (ref 4.22–5.81)
RDW: 12.5 % (ref 11.5–15.5)
WBC: 7.8 10*3/uL (ref 4.0–10.5)
nRBC: 0 % (ref 0.0–0.2)

## 2019-07-31 LAB — BASIC METABOLIC PANEL
Anion gap: 11 (ref 5–15)
BUN: 16 mg/dL (ref 8–23)
CO2: 26 mmol/L (ref 22–32)
Calcium: 8.3 mg/dL — ABNORMAL LOW (ref 8.9–10.3)
Chloride: 102 mmol/L (ref 98–111)
Creatinine, Ser: 0.83 mg/dL (ref 0.61–1.24)
GFR calc Af Amer: >60 mL/min (ref >60)
GFR calc non Af Amer: >60 mL/min (ref >60)
Glucose, Bld: 160 mg/dL — ABNORMAL HIGH (ref 70–99)
Potassium: 4 mmol/L (ref 3.5–5.1)
Sodium: 139 mmol/L (ref 135–145)

## 2019-07-31 MED ORDER — ONDANSETRON 4 MG PO TBDP: 4.0000 mg | ORAL_TABLET | Freq: Three times a day (TID) | ORAL | 0 refills | Status: DC | PRN

## 2019-07-31 MED ORDER — HYDROMORPHONE HCL 2 MG PO TABS: 2.0000 mg | ORAL_TABLET | ORAL | 0 refills | Status: DC | PRN

## 2019-07-31 MED ORDER — BUPIVACAINE IN DEXTROSE 0.75-8.25 % IT SOLN
INTRATHECAL | Status: DC | PRN
  Administered 2019-07-30: 1.6 mL via INTRATHECAL

## 2019-07-31 MED ORDER — ASPIRIN 325 MG PO TBEC: 325.0000 mg | DELAYED_RELEASE_TABLET | Freq: Two times a day (BID) | ORAL | 0 refills | Status: DC

## 2019-07-31 MED ORDER — METHOCARBAMOL 500 MG PO TABS: 500.0000 mg | ORAL_TABLET | Freq: Four times a day (QID) | ORAL | 1 refills | Status: DC | PRN

## 2019-07-31 NOTE — Addendum Note (Signed)
Addendum  created 07/31/19 1315 by Duane Boston, MD   Child order released for a procedure order, Clinical Note Signed, Intraprocedure Blocks edited

## 2019-07-31 NOTE — Progress Notes (Signed)
Physical Therapy Treatment Patient Details Name: [name] MRN: [id number] DOB: December 29, 1950 Today's Date: 07/31/2019    History of Present Illness 69 yo male s/p R TKR on 07/30/19. PMH includes OA, lung cancer s/p resection 2020, GERD, HLD.    PT Comments    Pt with improved tolerance for ambulation this session, and successfully navigated steps x2, once with PT and once with pt's wife who is pt's caregiver on d/c. PT verbally reviewed all TKR exercises with pt and pt's wife, handout also reviewed. Pt and pt's wife with no further questions, appropriate to d/c from a PT standpoint but pain is still significant. RN aware.   Follow Up Recommendations  Home health PT;Supervision for mobility/OOB     Equipment Recommendations  3in1 (PT)(per pt, he is going to borrow a RW from friend (2 front wheel per report))    Recommendations for Other Services       Precautions / Restrictions Precautions Precautions: Fall Restrictions Weight Bearing Restrictions: No RLE Weight Bearing: Weight bearing as tolerated    Mobility  Bed Mobility Overal bed mobility: Needs Assistance Bed Mobility: Supine to Sit;Sit to Supine       Sit to supine: Min assist   General bed mobility comments: up in bathroom upon PT arrival to room  Transfers Overall transfer level: Needs assistance Equipment used: Rolling walker (2 wheeled) Transfers: Sit to/from Stand Sit to Stand: Supervision         General transfer comment: supervision for safety, increased time to rise and steady but no physical assist required.  Ambulation/Gait Ambulation/Gait assistance: Min guard;Supervision(wife with chair follow) Gait Distance (Feet): 120 Feet Assistive device: Rolling walker (2 wheeled) Gait Pattern/deviations: Step-to pattern;Step-through pattern;Decreased stride length;Trunk flexed Gait velocity: decr   General Gait Details: min gurad to supervision for safety, verbal cuing for step-through gait and  constant RW speed, but pt limited in choppy short steps due to severe R knee pain (same as first session). Pt did, however, achieve more R foot flat and attempted to perform more step-through gait.   Stairs Stairs: Yes Stairs assistance: Min guard Stair Management: Step to pattern;Forwards;With walker Number of Stairs: 3(2x3 steps) General stair comments: min guard for safety, verbal cuing for sequencing (ascending with LLE, descending with RLE leading), placement and bracing of RW. PT instructed pt's wife on bracing RW and providing min guard to pt for pt safety, pt's wife demonstrating this on second attempt on steps and performed well.   Wheelchair Mobility    Modified Rankin (Stroke Patients Only)       Balance Overall balance assessment: Mild deficits observed, not formally tested                                          Cognition Arousal/Alertness: Awake/alert Behavior During Therapy: WFL for tasks assessed/performed Overall Cognitive Status: Within Functional Limits for tasks assessed                                        Exercises Total Joint Exercises Ankle Circles/Pumps: AROM;Both;10 reps;Seated Quad Sets: AROM;Right;5 reps;Seated Short Arc Quad: AAROM;5 reps;Right;Supine Heel Slides: 5 reps;Right;AROM;Seated Hip ABduction/ADduction: AAROM;Right;5 reps;Seated    General Comments        Pertinent Vitals/Pain Pain Assessment: 0-10 Pain Score: 7  Pain Location: R knee  Pain Descriptors / Indicators: Sore;Discomfort Pain Intervention(s): Limited activity within patient's tolerance;Monitored during session;Premedicated before session;Repositioned    Home Living                      Prior Function            PT Goals (current goals can now be found in the care plan section) Acute Rehab PT Goals Patient Stated Goal: return to PLOF PT Goal Formulation: With patient Time For Goal Achievement: 08/06/19 Potential to  Achieve Goals: Good Progress towards PT goals: Progressing toward goals    Frequency    7X/week      PT Plan Current plan remains appropriate    Co-evaluation              AM-PAC PT "6 Clicks" Mobility   Outcome Measure  Help needed turning from your back to your side while in a flat bed without using bedrails?: A Little Help needed moving from lying on your back to sitting on the side of a flat bed without using bedrails?: A Little Help needed moving to and from a bed to a chair (including a wheelchair)?: A Little Help needed standing up from a chair using your arms (e.g., wheelchair or bedside chair)?: A Little Help needed to walk in hospital room?: A Little Help needed climbing 3-5 steps with a railing? : A Little 6 Click Score: 18    End of Session Equipment Utilized During Treatment: Gait belt Activity Tolerance: Patient tolerated treatment well;Patient limited by pain Patient left: with call bell/phone within reach;in chair;with family/visitor present Nurse Communication: Mobility status;Patient requests pain meds(chair alarm batteries are empty) PT Visit Diagnosis: Other abnormalities of gait and mobility (R26.89);Difficulty in walking, not elsewhere classified (R26.2)     Time: [id number] PT Time Calculation (min) (ACUTE ONLY): 34 min  Charges:  $Gait Training: 23-37 mins                    Donata Reddick E, PT [place] Pager [phone]  Office [phone]    Roxine Caddy D Elonda Husky 07/31/2019, 4:27 PM

## 2019-07-31 NOTE — Plan of Care (Signed)
  Problem: Pain Managment: Goal: General experience of comfort will improve Outcome: Progressing   

## 2019-07-31 NOTE — Progress Notes (Signed)
Discharge paperwork and instructions given to pt and pt's wife at bedside. Pt not in distress and tolerated well.

## 2019-07-31 NOTE — Discharge Instructions (Signed)

## 2019-07-31 NOTE — Care Management Obs Status (Signed)
[facility] NOTIFICATION   Patient Details  Name: BLADYN ELKIND MRN: [id number] Date of Birth: January 14, 1951   Medicare Observation Status Notification Given:  Yes    Bartholomew Crews, RN 07/31/2019, 3:46 PM

## 2019-07-31 NOTE — Progress Notes (Signed)
Subjective: 1 Day Post-Op Procedure(s) (LRB): RIGHT TOTAL KNEE ARTHROPLASTY (Right) Patient reports pain as moderate.    Objective: Vital signs in last 24 hours: Temp:  [97 F (36.1 C)-98.3 F (36.8 C)] 97.9 F (36.6 C) (02/05 0433) Pulse Rate:  [54-68] 61 (02/05 0433) Resp:  [16-18] 16 (02/05 0433) BP: (111-116)/(68-77) 111/68 (02/05 0433) SpO2:  [95 %-100 %] 99 % (02/05 0433)  Intake/Output from previous day: 02/04 0701 - 02/05 0700 In: 1240 [P.O.:240; I.V.:800; IV Piggyback:200] Out: 675 [Urine:600; Blood:75] Intake/Output this shift: No intake/output data recorded.  Recent Labs    07/31/19 0315  HGB 11.7*   Recent Labs    07/31/19 0315  WBC 7.8  RBC 3.79*  HCT 35.1*  PLT 179   Recent Labs    07/31/19 0315  NA 139  K 4.0  CL 102  CO2 26  BUN 16  CREATININE 0.83  GLUCOSE 160*  CALCIUM 8.3*   No results for input(s): LABPT, INR in the last 72 hours.  Sensation intact distally Intact pulses distally Dorsiflexion/Plantar flexion intact Incision: dressing C/D/I   Assessment/Plan: 1 Day Post-Op Procedure(s) (LRB): RIGHT TOTAL KNEE ARTHROPLASTY (Right) Up with therapy Discharge home with home health    Patient's anticipated LOS is less than 2 midnights, meeting these requirements: - Younger than 42 - Lives within 1 hour of care - Has a competent adult at home to recover with post-op recover - NO history of  - Chronic pain requiring opiods  - Diabetes  - Coronary Artery Disease  - Heart failure  - Heart attack  - Stroke  - DVT/VTE  - Cardiac arrhythmia  - Respiratory Failure/COPD  - Renal failure  - Anemia  - Advanced Liver disease       Mcarthur Rossetti 07/31/2019, 6:49 AM

## 2019-07-31 NOTE — Discharge Summary (Signed)
Patient ID: [name] MRN: [id number] DOB/AGE: 1950/09/17 69 y.o.  Admit date: 07/30/2019 Discharge date: 07/31/2019  Admission Diagnoses:  Principal Problem:   Unilateral primary osteoarthritis, right knee Active Problems:   Status post total right knee replacement   Discharge Diagnoses:  Same  Past Medical History:  Diagnosis Date  . Arthritis    knee, no meds  . Cancer [facility]) 2020   carcinoid tumor of right lung  . 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 Eczema 08/08/2010  . Family history of adverse reaction to anesthesia    mom- post op N/V  . GERD (gastroesophageal reflux disease)    weight loss resolved gerd, no medication  . Hyperlipidemia   . PONV (postoperative nausea and vomiting)   . Sleep apnea    uses CPAP nightly  . Unilateral primary osteoarthritis, right knee 07/30/2019    Surgeries: Procedure(s): RIGHT TOTAL KNEE ARTHROPLASTY on 07/30/2019   Consultants:   Discharged Condition: Improved  Hospital Course: LUISALBERTO HEIMBERGER is an 69 y.o. male who was admitted 07/30/2019 for operative treatment ofUnilateral primary osteoarthritis, right knee. Patient has severe unremitting pain that affects sleep, daily activities, and work/hobbies. After pre-op clearance the patient was taken to the operating room on 07/30/2019 and underwent  Procedure(s): RIGHT TOTAL KNEE ARTHROPLASTY.    Patient was given perioperative antibiotics:  Anti-infectives (From admission, onward)   Start     Dose/Rate Route Frequency Ordered Stop   07/30/19 1400  ceFAZolin (ANCEF) IVPB 1 g/50 mL premix     1 g 100 mL/hr over 30 Minutes Intravenous Every 6 hours 07/30/19 1028 07/30/19 2128   07/30/19 0600  ceFAZolin (ANCEF) IVPB 2g/100 mL premix     2 g 200 mL/hr over 30 Minutes Intravenous On call to O.R. 07/30/19 0540 07/30/19 0745   07/30/19 0544  ceFAZolin (ANCEF) 2-4 GM/100ML-% IVPB    Note to Pharmacy: Marga Melnick   : cabinet override      07/30/19 0544 07/30/19 0754       Patient  was given sequential compression devices, early ambulation, and chemoprophylaxis to prevent DVT.  Patient benefited maximally from hospital stay and there were no complications.    Recent vital signs:  Patient Vitals for the past 24 hrs:  BP Temp Temp src Pulse Resp SpO2  07/31/19 0433 111/68 97.9 F (36.6 C) Oral 61 16 99 %  07/31/19 0031 116/69 98.3 F (36.8 C) Oral 66 16 100 %  07/30/19 1548 115/74 97.6 F (36.4 C) Oral 68 18 100 %  07/30/19 1029 113/77 97.7 F (36.5 C) Oral 63 18 100 %  07/30/19 0954 -- (!) 97 F (36.1 C) -- (!) 54 -- 95 %  07/30/19 0930 -- 97.8 F (36.6 C) -- -- -- --     Recent laboratory studies:  Recent Labs    07/31/19 0315  WBC 7.8  HGB 11.7*  HCT 35.1*  PLT 179  NA 139  K 4.0  CL 102  CO2 26  BUN 16  CREATININE 0.83  GLUCOSE 160*  CALCIUM 8.3*     Discharge Medications:   Allergies as of 07/31/2019      Reactions   Codeine Other (See Comments)   headache      Medication List    STOP taking these medications   diclofenac sodium 1 % Gel Commonly known as: Voltaren     TAKE these medications   aspirin 325 MG EC tablet Take 1 tablet (325 mg total) by mouth 2 (  two) times daily at 10 am and 4 pm.   HYDROmorphone 2 MG tablet Commonly known as: Dilaudid Take 1 tablet (2 mg total) by mouth every 4 (four) hours as needed for severe pain.   methocarbamol 500 MG tablet Commonly known as: ROBAXIN Take 1 tablet (500 mg total) by mouth every 6 (six) hours as needed for muscle spasms.   Milk Thistle 1000 MG Caps Take 1,000 mg by mouth daily.   rosuvastatin 10 MG tablet Commonly known as: CRESTOR Take 10 mg by mouth daily in the afternoon.            Durable Medical Equipment  (From admission, onward)         Start     Ordered   07/30/19 1029  DME Walker rolling  Once    Question Answer Comment  Walker: With 5 Inch Wheels   Patient needs a walker to treat with the following condition Status post total right knee  replacement      07/30/19 1028   07/30/19 1029  DME 3 n 1  Once     07/30/19 1028          Diagnostic Studies: DG Knee Right Port  Result Date: 07/30/2019 CLINICAL DATA:  Status post right knee replacement today. EXAM: PORTABLE RIGHT KNEE - 1-2 VIEW COMPARISON:  Plain films right knee 07/02/2019. FINDINGS: New right knee arthroplasty is in place. Hardware is normal in appearance. No acute abnormality is identified. Gas in the soft tissues from surgery noted. IMPRESSION: Status post right knee replacement.  No acute abnormality. Electronically Signed   By: Inge Rise M.D.   On: 07/30/2019 09:55   XR Knee 1-2 Views Right  Result Date: 07/02/2019 Right knee AP lateral views: No acute fracture.  Lateral joint line with moderately severe narrowing.  Moderate patellofemoral arthritic changes.  Medial compartment with blunting of the femoral condyle.  No acute fracture.   Disposition: Discharge disposition: 01-Home or Self Care         Follow-up Information    Mcarthur Rossetti, MD Follow up in 2 week(s).   Specialty: Orthopedic Surgery Contact information: [address] [place] [address] [phone]            Signed: Mcarthur Rossetti 07/31/2019, 6:50 AM

## 2019-07-31 NOTE — TOC Initial Note (Signed)
Transition of Care [facility]) - Initial/Assessment Note    Patient Details  Name: [name] MRN: [id number] Date of Birth: 12-14-1950  Transition of Care [facility]) CM/SW Contact:    Bartholomew Crews, RN Phone Number: [phone] 07/31/2019, 3:05 PM  Clinical Narrative:                 Spoke with patient and spouse at the bedside. Discussed recommendations for home health and choosing [place] agency. Referral placed to [facility] - accepted. Discussed DME recommendations - declined RW stating already has one, but accepts 3-N-1. Referral placed to [facility] to deliver to bedside. Patient to transition home today. TOC team following.   Expected Discharge Plan: [place] Barriers to Discharge: No Barriers Identified   Patient Goals and CMS Choice Patient states their goals for this hospitalization and ongoing recovery are:: home CMS Medicare.gov Compare Post Acute Care list provided to:: Patient Choice offered to / list presented to : Patient, Spouse  Expected Discharge Plan and Services Expected Discharge Plan: [place] In-house Referral: NA Discharge Planning Services: CM Consult Post Acute Care Choice: Home Health, Durable Medical Equipment Living arrangements for the past 2 months: [place] Expected Discharge Date: 07/31/19               DME Arranged: 3-N-1 DME Agency: [facility] Date DME Agency Contacted: 07/31/19 Time DME Agency Contacted: [phone] Representative spoke with at DME Agency: [place]: PT [place]: [facility] (formerly [employer]) Date [place]: 07/31/19 Time [place]: 1504 Representative spoke with at [place]: [place] Arrangements/Services Living arrangements for the past 2 months: [place] with:: Self, Spouse Patient language and need for interpreter reviewed:: Yes Do you feel safe going back to the place where you live?: Yes      Need for  Family Participation in Patient Care: Yes (Comment) Care giver support system in place?: Yes (comment) Current home services: DME Criminal Activity/Legal Involvement Pertinent to Current Situation/Hospitalization: No - Comment as needed  Activities of Daily Living Home Assistive Devices/Equipment: Eyeglasses ADL Screening (condition at time of admission) Patient's cognitive ability adequate to safely complete daily activities?: Yes Is the patient deaf or have difficulty hearing?: No Does the patient have difficulty seeing, even when wearing glasses/contacts?: No Does the patient have difficulty concentrating, remembering, or making decisions?: No Patient able to express need for assistance with ADLs?: Yes Does the patient have difficulty dressing or bathing?: No Independently performs ADLs?: Yes (appropriate for developmental age) Does the patient have difficulty walking or climbing stairs?: Yes Weakness of Legs: Right Weakness of Arms/Hands: None  Permission Sought/Granted Permission sought to share information with : Family Supports Permission granted to share information with : Yes, Verbal Permission Granted  Share Information with NAME: Johngabriel Amburn     Permission granted to share info w Relationship: spouse  Permission granted to share info w Contact Information: [phone]  Emotional Assessment Appearance:: Appears stated age Attitude/Demeanor/Rapport: Engaged Affect (typically observed): Accepting Orientation: : Oriented to Self, Oriented to  Time, Oriented to Place, Oriented to Situation Alcohol / Substance Use: Not Applicable Psych Involvement: No (comment)  Admission diagnosis:  Status post total right knee replacement [Z96.651] Patient Active Problem List   Diagnosis Date Noted  . Unilateral primary osteoarthritis, right knee 07/30/2019  . 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 COLONIC POLYPS,  ADENOMATOUS  05/24/2008  . DIVERTICULOSIS, COLON 05/24/2008   PCP:  [name], MD Pharmacy:   [facility], [place] [address] Phone: [phone] Fax: [phone]     Social Determinants of Health (SDOH) Interventions    Readmission Risk Interventions No flowsheet data found.

## 2019-07-31 NOTE — Progress Notes (Signed)
Physical Therapy Treatment Patient Details Name: [name] MRN: [id number] DOB: 01-Dec-1950 Today's Date: 07/31/2019    History of Present Illness 69 yo male s/p R TKR on 07/30/19. PMH includes OA, lung cancer s/p resection 2020, GERD, HLD.    PT Comments    Pt with significant RLE pain this am, given pain medications during PT session. Pt ambulated hallway distance, still presenting with short steps due to knee pain. PT administered exercise handout, and demonstrated, reviewed, and practiced initial TKR exercises with pt limited by pt pain and fatigue. PT to see pt for second session to further address gait training, stair navigation, and complete TKR exercises.    Follow Up Recommendations  Home health PT;Supervision for mobility/OOB     Equipment Recommendations  3in1 (PT)(per pt, he is going to borrow a RW from friend (2 front wheel per report))    Recommendations for Other Services       Precautions / Restrictions Precautions Precautions: Fall Restrictions Weight Bearing Restrictions: No RLE Weight Bearing: Weight bearing as tolerated    Mobility  Bed Mobility Overal bed mobility: Needs Assistance Bed Mobility: Supine to Sit;Sit to Supine       Sit to supine: Min assist   General bed mobility comments: pt up in chair upon PT arrival, min assist for RLE lifting into bed for return to supine.  Transfers Overall transfer level: Needs assistance Equipment used: Rolling walker (2 wheeled) Transfers: Sit to/from Stand Sit to Stand: From elevated surface;Min guard         General transfer comment: min guard for safety, increased time to rise and steady. Verbal cuing for hand placement when rising from recliner.  Ambulation/Gait Ambulation/Gait assistance: Min guard;+2 safety/equipment(wife with chair follow) Gait Distance (Feet): 100 Feet Assistive device: Rolling walker (2 wheeled) Gait Pattern/deviations: Step-to pattern;Step-through pattern;Decreased  stride length;Trunk flexed Gait velocity: decr   General Gait Details: min gurad to supervision for safety, verbal cuing for step-through gait and constant RW speed, but pt limited in choppy short steps due to severe R knee pain.   Stairs             Wheelchair Mobility    Modified Rankin (Stroke Patients Only)       Balance Overall balance assessment: Mild deficits observed, not formally tested                                          Cognition Arousal/Alertness: Awake/alert Behavior During Therapy: WFL for tasks assessed/performed Overall Cognitive Status: Within Functional Limits for tasks assessed                                        Exercises Total Joint Exercises Ankle Circles/Pumps: AROM;Both;10 reps;Seated Quad Sets: AROM;Right;5 reps;Seated Heel Slides: 5 reps;Right;AROM;Seated(towel under foot to assist in knee flexion) Hip ABduction/ADduction: AAROM;Right;5 reps;Seated    General Comments        Pertinent Vitals/Pain Pain Assessment: 0-10 Pain Score: 8  Pain Location: R knee Pain Descriptors / Indicators: Sore;Discomfort Pain Intervention(s): Repositioned;Monitored during session;Limited activity within patient's tolerance;RN gave pain meds during session    Home Living                      Prior Function  PT Goals (current goals can now be found in the care plan section) Acute Rehab PT Goals Patient Stated Goal: return to PLOF PT Goal Formulation: With patient Time For Goal Achievement: 08/06/19 Potential to Achieve Goals: Good Progress towards PT goals: Progressing toward goals    Frequency    7X/week      PT Plan      Co-evaluation              AM-PAC PT "6 Clicks" Mobility   Outcome Measure  Help needed turning from your back to your side while in a flat bed without using bedrails?: A Little Help needed moving from lying on your back to sitting on the side of a  flat bed without using bedrails?: A Little Help needed moving to and from a bed to a chair (including a wheelchair)?: A Little Help needed standing up from a chair using your arms (e.g., wheelchair or bedside chair)?: A Little Help needed to walk in hospital room?: A Little Help needed climbing 3-5 steps with a railing? : A Lot 6 Click Score: 17    End of Session Equipment Utilized During Treatment: Gait belt Activity Tolerance: Patient tolerated treatment well;Patient limited by pain Patient left: in bed;with call bell/phone within reach(pt verbalizes he will not get up without PT assist) Nurse Communication: Mobility status;Other (comment)(chair alarm batteries are empty) PT Visit Diagnosis: Other abnormalities of gait and mobility (R26.89);Difficulty in walking, not elsewhere classified (R26.2)     Time: [id number] PT Time Calculation (min) (ACUTE ONLY): 33 min  Charges:  $Gait Training: 8-22 mins $Therapeutic Exercise: 8-22 mins                     [name] E, PT Acute Rehabilitation Services Pager [phone]  Office [phone]  [name] D [name] 07/31/2019, 12:45 PM

## 2019-07-31 NOTE — Anesthesia Procedure Notes (Signed)
Spinal  Patient location during procedure: OR Start time: 07/30/2019 7:29 AM End time: 07/31/2019 7:39 AM Staffing Performed: anesthesiologist  Anesthesiologist: Duane Boston, MD Preanesthetic Checklist Completed: patient identified, IV checked, risks and benefits discussed, surgical consent, monitors and equipment checked, pre-op evaluation and timeout performed Spinal Block Patient position: sitting Prep: DuraPrep Patient monitoring: cardiac monitor, continuous pulse ox and blood pressure Approach: midline Location: L2-3 Injection technique: single-shot Needle Needle type: Pencan  Needle gauge: 24 G Needle length: 9 cm Additional Notes Functioning IV was confirmed and monitors were applied. Sterile prep and drape, including hand hygiene and sterile gloves were used. The patient was positioned and the spine was prepped. The skin was anesthetized with lidocaine.  Free flow of clear CSF was obtained prior to injecting local anesthetic into the CSF.  The spinal needle aspirated freely following injection.  The needle was carefully withdrawn.  The patient tolerated the procedure well.

## 2019-08-03 ENCOUNTER — Telehealth: Payer: Self-pay | Admitting: Orthopaedic Surgery

## 2019-08-03 NOTE — Telephone Encounter (Signed)
Patient aware these are over the  counter

## 2019-08-03 NOTE — Telephone Encounter (Signed)
Patient called. He would like a stool softener called in or laxative. His call back number is [phone]

## 2019-08-04 ENCOUNTER — Telehealth: Payer: Self-pay | Admitting: Orthopaedic Surgery

## 2019-08-04 ENCOUNTER — Other Ambulatory Visit: Payer: Self-pay | Admitting: Orthopaedic Surgery

## 2019-08-04 MED ORDER — HYDROMORPHONE HCL 2 MG PO TABS: 2.0000 mg | ORAL_TABLET | ORAL | 0 refills | Status: DC | PRN

## 2019-08-04 NOTE — Telephone Encounter (Signed)
Patient called requesting refill hydrocodone. Please send to pharmacy on file. Patient phone number is [phone].

## 2019-08-04 NOTE — Telephone Encounter (Signed)
Please advise 

## 2019-08-05 ENCOUNTER — Other Ambulatory Visit: Payer: Self-pay | Admitting: *Deleted

## 2019-08-05 ENCOUNTER — Ambulatory Visit: Payer: Medicare Other

## 2019-08-05 DIAGNOSIS — C7A090 Malignant carcinoid tumor of the bronchus and lung: Secondary | ICD-10-CM

## 2019-08-12 ENCOUNTER — Telehealth: Payer: Self-pay | Admitting: Orthopaedic Surgery

## 2019-08-12 NOTE — Telephone Encounter (Signed)
LMOM for Randall Hiss letting him know we will see him tomorrow and refer him to out patient therapy

## 2019-08-12 NOTE — Telephone Encounter (Signed)
Cindee Salt with [facility] called in wanting to ask Dr. Ninfa Linden if he was going to want to set up PT for the pt?   [phone]

## 2019-08-13 ENCOUNTER — Inpatient Hospital Stay: Payer: Medicare Other | Admitting: Orthopaedic Surgery

## 2019-08-17 ENCOUNTER — Other Ambulatory Visit: Payer: Self-pay

## 2019-08-17 ENCOUNTER — Encounter: Payer: Self-pay | Admitting: Orthopaedic Surgery

## 2019-08-17 ENCOUNTER — Ambulatory Visit (INDEPENDENT_AMBULATORY_CARE_PROVIDER_SITE_OTHER): Payer: Medicare Other | Admitting: Orthopaedic Surgery

## 2019-08-17 ENCOUNTER — Inpatient Hospital Stay: Payer: Medicare Other | Admitting: Orthopaedic Surgery

## 2019-08-17 DIAGNOSIS — Z96651 Presence of right artificial knee joint: Secondary | ICD-10-CM

## 2019-08-17 MED ORDER — HYDROCODONE-ACETAMINOPHEN 5-325 MG PO TABS: 1.0000 | ORAL_TABLET | Freq: Four times a day (QID) | ORAL | 0 refills | Status: DC | PRN

## 2019-08-17 NOTE — Progress Notes (Signed)
The patient is 2 weeks status post a right total knee arthroplasty.  He is still having difficulty sleeping at night.  He stopped narcotics today.  He has been through home therapy.  He said the best they have flexion is just beyond 90 degrees with her right knee.  On exam his calf is soft.  His knee is swollen which is at the appropriate amount postoperative.  His right knee incision looks good and I did remove some of the old Steri-Strips and replaced them with new ones.  I was able to flex his knee to about 90 degrees.  At this point I will send in some hydrocodone for him.  We will order outpatient physical therapy as well.  All questions and concerns were answered and addressed.  We will reevaluate him in 4 weeks but no x-rays are needed.

## 2019-08-26 ENCOUNTER — Other Ambulatory Visit: Payer: Self-pay | Admitting: Orthopaedic Surgery

## 2019-08-26 ENCOUNTER — Encounter: Payer: Self-pay | Admitting: Orthopaedic Surgery

## 2019-08-26 MED ORDER — OXYCODONE HCL 5 MG PO TABS: 5.0000 mg | ORAL_TABLET | Freq: Four times a day (QID) | ORAL | 0 refills | Status: DC | PRN

## 2019-08-27 ENCOUNTER — Other Ambulatory Visit: Payer: Self-pay

## 2019-08-27 ENCOUNTER — Ambulatory Visit: Payer: Medicare Other | Attending: Orthopaedic Surgery | Admitting: Physical Therapy

## 2019-08-27 DIAGNOSIS — R2689 Other abnormalities of gait and mobility: Secondary | ICD-10-CM | POA: Insufficient documentation

## 2019-08-27 DIAGNOSIS — M25561 Pain in right knee: Secondary | ICD-10-CM | POA: Diagnosis present

## 2019-08-27 DIAGNOSIS — R600 Localized edema: Secondary | ICD-10-CM

## 2019-08-27 DIAGNOSIS — M25661 Stiffness of right knee, not elsewhere classified: Secondary | ICD-10-CM | POA: Insufficient documentation

## 2019-08-27 NOTE — Therapy (Signed)
[facility] [address], [place], [place], [address] Phone: [phone]   Fax:  [phone]  Physical Therapy Evaluation  Patient Details  Name: [name] MRN: [id number] Date of Birth: 05/06/1951 Referring Provider (PT): Jean Rosenthal   Encounter Date: 08/27/2019  PT End of Session - 08/27/19 0932    Visit Number  1    Date for PT Re-Evaluation  10/22/19    PT Start Time  0932    PT Stop Time  1013    PT Time Calculation (min)  41 min    Activity Tolerance  Patient tolerated treatment well    Behavior During Therapy  [facility] for tasks assessed/performed       Past Medical History:  Diagnosis Date  . Arthritis    knee, no meds  . Cancer [facility]) 2020   carcinoid tumor of right lung  . 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 Eczema 08/08/2010  . Family history of adverse reaction to anesthesia    mom- post op N/V  . GERD (gastroesophageal reflux disease)    weight loss resolved gerd, no medication  . Hyperlipidemia   . PONV (postoperative nausea and vomiting)   . Sleep apnea    uses CPAP nightly  . Unilateral primary osteoarthritis, right knee 07/30/2019    Past Surgical History:  Procedure Laterality Date  . COLONOSCOPY     hx polyps  . EYE SURGERY     lasik  . INTERCOSTAL NERVE BLOCK  01/20/2019   Procedure: Intercostal Nerve Block;  Surgeon: Grace Isaac, MD;  Location: [place];  Service: Thoracic;;  . KNEE ARTHROSCOPY Right   . SEPTOPLASTY  1980   x 2  . TONSILLECTOMY AND ADENOIDECTOMY  1960  . TOTAL KNEE ARTHROPLASTY Right 07/30/2019  . TOTAL KNEE ARTHROPLASTY Right 07/30/2019   Procedure: RIGHT TOTAL KNEE ARTHROPLASTY;  Surgeon: Mcarthur Rossetti, MD;  Location: [place];  Service: Orthopedics;  Laterality: Right;  Marland Kitchen VIDEO ASSISTED THORACOSCOPY (VATS)/WEDGE RESECTION Right 01/20/2019   Procedure: VIDEO ASSISTED THORACOSCOPY (VATS) WITH RESECTION OF RIGHT INTRAPULMONARY LYMPH NODE;   Surgeon: Grace Isaac, MD;  Location: [place];  Service: Thoracic;  Laterality: Right;  Marland Kitchen VIDEO BRONCHOSCOPY N/A 01/20/2019   Procedure: VIDEO BRONCHOSCOPY;  Surgeon: Grace Isaac, MD;  Location: [facility] OR;  Service: Thoracic;  Laterality: N/A;    There were no vitals filed for this visit.   Subjective Assessment - 08/27/19 0935    Subjective  Patient had right TKA on 07/30/19. Patient reporting discomfort and tightness.    Pertinent History  OA    Patient Stated Goals  Want to be able to cut toenails on his right foot    Currently in Pain?  Yes    Pain Score  4     Pain Location  Knee    Pain Orientation  Right    Pain Descriptors / Indicators  Discomfort    Pain Type  Surgical pain    Pain Onset  1 to 4 weeks ago    Pain Frequency  Constant    Aggravating Factors   stretches    Pain Relieving Factors  ices 3-4x/day         [facility] PT Assessment - 08/27/19 0001      Assessment   Medical Diagnosis  Right TKA    Referring Provider (PT)  Jean Rosenthal    Onset Date/Surgical Date  07/30/19    Next MD Visit  09/14/19  Precautions   Precautions  None      Restrictions   Weight Bearing Restrictions  No      Balance Screen   Has the patient fallen in the past 6 months  No    Has the patient had a decrease in activity level because of a fear of falling?   No    Is the patient reluctant to leave their home because of a fear of falling?   No      Home Film/video editor residence    Living Arrangements  Spouse/significant other    Additional Comments  stairs in den 3 steps uses step to gait      Prior Function   Level of Independence  Independent    Vocation  Retired    Office manager, yard work      Observation/Other Assessments-Edema    Edema  Circumferential      Circumferential Edema   Circumferential - Right  46.5 cm    Circumferential - Left   43.25 cm      Posture/Postural Control   Posture Comments  WB through left LE       ROM / Strength   AROM / PROM / Strength  AROM;PROM;Strength      AROM   AROM Assessment Site  Knee    Right/Left Knee  Right    Right Knee Extension  -11    Right Knee Flexion  97      PROM   PROM Assessment Site  Knee    Right/Left Knee  Right    Right Knee Extension  -8    Right Knee Flexion  103      Strength   Overall Strength Comments  Lt knee 5/5; Rt hip ABD and flexion 5/5, ext 4+/5. left hip flex/ext 5/5    Strength Assessment Site  Knee    Right/Left Knee  Right    Right Knee Flexion  4+/5    Right Knee Extension  5/5      Flexibility   Soft Tissue Assessment /Muscle Length  yes    Hamstrings  bil tightness      Palpation   Patella mobility  prox/distal decreased    Palpation comment  tender around knee      Ambulation/Gait   Gait Pattern  Step-through pattern;Decreased stance time - right    Stairs  Yes    Stairs Assistance  7: Independent    Stair Management Technique  One rail Left;Step to pattern    Number of Stairs  4    Height of Stairs  6    Gait Comments  mild deviations due to decreased ROM                Objective measurements completed on examination: See above findings.              PT Education - 08/27/19 2115    Education Details  HEP    Person(s) Educated  Patient    Methods  Explanation;Demonstration;Handout    Comprehension  Verbalized understanding;Returned demonstration       PT Short Term Goals - 08/27/19 2118      PT SHORT TERM GOAL #1   Title  Ind with initial HEP    Status  New    Target Date  09/24/19      PT SHORT TERM GOAL #2   Title  Pt able to climb stairs with a reciprocal gait pattern  Status  New    Target Date  09/24/19        PT Long Term Goals - 08/27/19 2118      PT LONG TERM GOAL #1   Title  Pt to demo right knee flex 0-125 to normalize gait and ADLs    Time  8    Period  Weeks    Status  New    Target Date  10/22/19      PT LONG TERM GOAL #2   Title  Patient able to  perform ADLs without increased pain in the right knee.    Time  8    Period  Weeks    Status  New      PT LONG TERM GOAL #3   Title  Pt to demo 5/5 RLE strength    Time  8    Period  Weeks    Status  New      PT LONG TERM GOAL #4   Title  Patient able to sleep 6 hours without waking from knee pain    Time  8    Period  Weeks    Status  New      PT LONG TERM GOAL #5   Title  Patient able to cut his toenails without difficulty    Time  8    Period  Weeks    Status  New             Plan - 08/27/19 1027    Clinical Impression Statement  Patient presents s/p right TKA on 07/30/19. He has decreased right knee flex and ext and significant edema affecting gait. He has mild strength deficits with MMT and functional weakness with stair climbing. He is unable to sleep due to knee pain at night. Patient will benefit from PT to restore ROM and strength to return pt to his PLOF.    Examination-Activity Limitations  Sleep    Stability/Clinical Decision Making  Stable/Uncomplicated    Clinical Decision Making  Low    Rehab Potential  Excellent    PT Frequency  2x / week    PT Duration  8 weeks    PT Treatment/Interventions  ADLs/Self Care Home Management;Electrical Stimulation;Cryotherapy;Gait training;Stair training;Therapeutic exercise;Balance training;Neuromuscular re-education;Patient/family education;Manual techniques;Passive range of motion;Taping;Vasopneumatic Device;Joint Manipulations    PT Next Visit Plan  knee ROM/strength; vaso; gait/balance    PT Home Exercise Plan  [email or web address]    Consulted and Agree with Plan of Care  Patient       Patient will benefit from skilled therapeutic intervention in order to improve the following deficits and impairments:  Abnormal gait, Decreased range of motion, Pain, Increased edema, Decreased strength, Impaired flexibility  Visit Diagnosis: Stiffness of right knee, not elsewhere classified - Plan: PT plan of care cert/re-cert  Acute pain of  right knee - Plan: PT plan of care cert/re-cert  Localized edema - Plan: PT plan of care cert/re-cert  Other abnormalities of gait and mobility - Plan: PT plan of care cert/re-cert     Problem List Patient Active Problem List   Diagnosis Date Noted  . Unilateral primary osteoarthritis, right knee 07/30/2019  . 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 COLONIC POLYPS, ADENOMATOUS 05/24/2008  . DIVERTICULOSIS, COLON 05/24/2008    Madelyn Flavors PT 08/27/2019, 9:24 PM  [facility] [address] [place], [place], [address] Phone: [phone]   Fax:  [phone]  Name: SVEN TRIBE MRN: [id number] Date of Birth: Nov 13, 1950

## 2019-08-27 NOTE — Patient Instructions (Signed)
Access Code: [email or web address]  URL: [email or web address]  Date: 08/27/2019  Prepared by: Almyra Free Santos Sollenberger   Exercises Supine Knee Extension Stretch on Towel Roll - 3 reps - 1 sets - 60 sec hold - 1x daily - 7x weekly Seated Passive Knee Extension - 1 reps - 1 sets - 5 min or longer hold - 4x daily - 7x weekly Prone Knee Extension Hang - 1 reps - 1 sets - 5 min hold - 4x daily - 7x weekly Prone Quadriceps Stretch with Strap - 3 reps - 1 sets - 60 sec hold - 2-3x daily - 7x weekly

## 2019-09-01 ENCOUNTER — Encounter: Payer: Self-pay | Admitting: Physical Therapy

## 2019-09-01 ENCOUNTER — Ambulatory Visit: Payer: Medicare Other | Admitting: Physical Therapy

## 2019-09-01 ENCOUNTER — Other Ambulatory Visit: Payer: Self-pay

## 2019-09-01 DIAGNOSIS — R600 Localized edema: Secondary | ICD-10-CM

## 2019-09-01 DIAGNOSIS — M25561 Pain in right knee: Secondary | ICD-10-CM

## 2019-09-01 DIAGNOSIS — M25661 Stiffness of right knee, not elsewhere classified: Secondary | ICD-10-CM

## 2019-09-01 DIAGNOSIS — R2689 Other abnormalities of gait and mobility: Secondary | ICD-10-CM

## 2019-09-01 NOTE — Therapy (Signed)
[facility] [address], [place], [place], [address] Phone: [phone]   Fax:  [phone]  Physical Therapy Treatment  Patient Details  Name: [name] MRN: [id number] Date of Birth: 13-May-1951 Referring Provider (PT): Jean Rosenthal   Encounter Date: 09/01/2019  PT End of Session - 09/01/19 0935    Visit Number  2    Date for PT Re-Evaluation  10/22/19    Authorization Type  UHC Medicare    Progress Note Due on Visit  10    PT Start Time  0935    PT Stop Time  1013    PT Time Calculation (min)  38 min    Activity Tolerance  Patient tolerated treatment well    Behavior During Therapy  [facility] for tasks assessed/performed       Past Medical History:  Diagnosis Date  . Arthritis    knee, no meds  . 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 Eczema 08/08/2010  . Family history of adverse reaction to anesthesia    mom- post op N/V  . GERD (gastroesophageal reflux disease)    weight loss resolved gerd, no medication  . Hyperlipidemia   . PONV (postoperative nausea and vomiting)   . Sleep apnea    uses CPAP nightly  . Unilateral primary osteoarthritis, right knee 07/30/2019    Past Surgical History:  Procedure Laterality Date  . COLONOSCOPY     hx polyps  . EYE SURGERY     lasik  . INTERCOSTAL NERVE BLOCK  01/20/2019   Procedure: Intercostal Nerve Block;  Surgeon: Grace Isaac, MD;  Location: [place];  Service: Thoracic;;  . KNEE ARTHROSCOPY Right   . SEPTOPLASTY  1980   x 2  . TONSILLECTOMY AND ADENOIDECTOMY  1960  . TOTAL KNEE ARTHROPLASTY Right 07/30/2019  . TOTAL KNEE ARTHROPLASTY Right 07/30/2019   Procedure: RIGHT TOTAL KNEE ARTHROPLASTY;  Surgeon: Mcarthur Rossetti, MD;  Location: [place];  Service: Orthopedics;  Laterality: Right;  Marland Kitchen VIDEO ASSISTED THORACOSCOPY (VATS)/WEDGE RESECTION Right 01/20/2019   Procedure: VIDEO ASSISTED  THORACOSCOPY (VATS) WITH RESECTION OF RIGHT INTRAPULMONARY LYMPH NODE;  Surgeon: Grace Isaac, MD;  Location: [place];  Service: Thoracic;  Laterality: Right;  Marland Kitchen VIDEO BRONCHOSCOPY N/A 01/20/2019   Procedure: VIDEO BRONCHOSCOPY;  Surgeon: Grace Isaac, MD;  Location: [facility] OR;  Service: Thoracic;  Laterality: N/A;    There were no vitals filed for this visit.  Subjective Assessment - 09/01/19 1948    Subjective  Patient reporting some discomfort in right knee.    Patient Stated Goals  Want to be able to cut toenails on his right foot       Reports 2/10 discomfort in right knee today.                [facility] Adult PT Treatment/Exercise - 09/01/19 0001      Exercises   Exercises  Knee/Hip      Knee/Hip Exercises: Aerobic   Stationary Bike  partial revolutions for ROM x 5 min      Knee/Hip Exercises: Standing   Lateral Step Up  Right;10 reps;Step Height: 6"    Forward Step Up  10 reps;Step Height: 6"    Step Down  2 sets;10 reps;Hand Hold: 1;Step Height: 4";Step Height: 6"    Other Standing Knee Exercises  bwd step up onto 4 in and 6 in x 10  Knee/Hip Exercises: Seated   Long Arc Quad  Right;20 reps    Long Arc Quad Limitations  5 sec hold    Heel Slides  20 reps;Right    Heel Slides Limitations  on slider    Other Seated Knee/Hip Exercises  quad sets 5 sec hold 2x10   long sitting     Knee/Hip Exercises: Supine   Short Arc Quad Sets  Right;2 sets;10 reps    Short Arc Quad Sets Limitations  5 sec    Heel Slides  Right;20 reps      Manual Therapy   Manual Therapy  Taping    Kinesiotex  Edema      Kinesiotix   Edema  2 3-prong spider med and lat Right knee             PT Education - 09/01/19 1016    Education Details  HEP Progressed and education on taping precautions/removal    Person(s) Educated  Patient    Methods  Explanation;Demonstration;Handout    Comprehension  Verbalized understanding;Returned demonstration       PT Short Term  Goals - 08/27/19 2118      PT SHORT TERM GOAL #1   Title  Ind with initial HEP    Status  New    Target Date  09/24/19      PT SHORT TERM GOAL #2   Title  Pt able to climb stairs with a reciprocal gait pattern    Status  New    Target Date  09/24/19        PT Long Term Goals - 08/27/19 2118      PT LONG TERM GOAL #1   Title  Pt to demo right knee flex 0-125 to normalize gait and ADLs    Time  8    Period  Weeks    Status  New    Target Date  10/22/19      PT LONG TERM GOAL #2   Title  Patient able to perform ADLs without increased pain in the right knee.    Time  8    Period  Weeks    Status  New      PT LONG TERM GOAL #3   Title  Pt to demo 5/5 RLE strength    Time  8    Period  Weeks    Status  New      PT LONG TERM GOAL #4   Title  Patient able to sleep 6 hours without waking from knee pain    Time  8    Period  Weeks    Status  New      PT LONG TERM GOAL #5   Title  Patient able to cut his toenails without difficulty    Time  8    Period  Weeks    Status  New            Plan - 09/01/19 1017    Clinical Impression Statement  Patient did well with bike for ROM and with strengthening today. States he has started walking 1 block per day and is exhausted afterwards. Sleep still difficult. KT tape applied for edema.    PT Treatment/Interventions  ADLs/Self Care Home Management;Electrical Stimulation;Cryotherapy;Gait training;Stair training;Therapeutic exercise;Balance training;Neuromuscular re-education;Patient/family education;Manual techniques;Passive range of motion;Taping;Vasopneumatic Device;Joint Manipulations    PT Next Visit Plan  knee ROM/strength; vaso; gait/balance    PT Home Exercise Plan  [facility]       Patient will benefit  from skilled therapeutic intervention in order to improve the following deficits and impairments:  Abnormal gait, Decreased range of motion, Pain, Increased edema, Decreased strength, Impaired flexibility  Visit  Diagnosis: Stiffness of right knee, not elsewhere classified  Acute pain of right knee  Localized edema  Other abnormalities of gait and mobility     Problem List Patient Active Problem List   Diagnosis Date Noted  . Unilateral primary osteoarthritis, right knee 07/30/2019  . 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 COLONIC POLYPS, ADENOMATOUS 05/24/2008  . DIVERTICULOSIS, COLON 05/24/2008    Madelyn Flavors PT 09/01/2019, 7:49 PM  [facility] [address] [place], [place], [address] Phone: [phone]   Fax:  [phone]  Name: FABRICE HORNBACK MRN: [id number] Date of Birth: Jan 14, 1951

## 2019-09-01 NOTE — Patient Instructions (Signed)
Access Code: [email or web address] URL: [email or web address] Date: 09/01/2019 Prepared by: Almyra Free Hazell Siwik  Exercises Supine Knee Extension Stretch on Towel Roll - 3 reps - 1 sets - 60 sec hold - 1x daily - 7x weekly Seated Passive Knee Extension - 1 reps - 1 sets - 5 min or longer hold - 4x daily - 7x weekly Prone Knee Extension Hang - 1 reps - 1 sets - 5 min hold - 4x daily - 7x weekly Prone Quadriceps Stretch with Strap - 3 reps - 1 sets - 60 sec hold - 2-3x daily - 7x weekly Supine Quad Set - 10 reps - 2 sets - 5 sec hold - 2x daily - 7x weekly Seated Long Arc Quad - 10 reps - 2 sets - 5 sec hold - 2x daily - 7x weekly Supine Knee Extension Strengthening - 10 reps - 2 sets - 5 sec hold - 2x daily - 7x weekly

## 2019-09-03 ENCOUNTER — Encounter: Payer: Self-pay | Admitting: Physical Therapy

## 2019-09-03 ENCOUNTER — Ambulatory Visit: Payer: Medicare Other | Admitting: Physical Therapy

## 2019-09-03 ENCOUNTER — Other Ambulatory Visit: Payer: Self-pay

## 2019-09-03 DIAGNOSIS — M25561 Pain in right knee: Secondary | ICD-10-CM

## 2019-09-03 DIAGNOSIS — M25661 Stiffness of right knee, not elsewhere classified: Secondary | ICD-10-CM | POA: Diagnosis not present

## 2019-09-03 DIAGNOSIS — R600 Localized edema: Secondary | ICD-10-CM

## 2019-09-03 DIAGNOSIS — R2689 Other abnormalities of gait and mobility: Secondary | ICD-10-CM

## 2019-09-03 NOTE — Therapy (Signed)
[facility] [address], [place], [place], [address] Phone: [phone]   Fax:  [phone]  Physical Therapy Treatment  Patient Details  Name: [name] MRN: [id number] Date of Birth: October 03, 1950 Referring Provider (PT): Jean Rosenthal   Encounter Date: 09/03/2019  PT End of Session - 09/03/19 0933    Visit Number  3    Date for PT Re-Evaluation  10/22/19    Authorization Type  [employer]HC Medicare    Progress Note Due on Visit  10    PT Start Time  0931    PT Stop Time  1021    PT Time Calculation (min)  50 min    Activity Tolerance  Patient tolerated treatment well    Behavior During Therapy  [facility] for tasks assessed/performed       Past Medical History:  Diagnosis Date  . Arthritis    knee, no meds  . Cancer [facility]) 2020   carcinoid tumor of right lung  . 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 Eczema 08/08/2010  . Family history of adverse reaction to anesthesia    mom- post op N/V  . GERD (gastroesophageal reflux disease)    weight loss resolved gerd, no medication  . Hyperlipidemia   . PONV (postoperative nausea and vomiting)   . Sleep apnea    uses CPAP nightly  . Unilateral primary osteoarthritis, right knee 07/30/2019    Past Surgical History:  Procedure Laterality Date  . COLONOSCOPY     hx polyps  . EYE SURGERY     lasik  . INTERCOSTAL NERVE BLOCK  01/20/2019   Procedure: Intercostal Nerve Block;  Surgeon: Grace Isaac, MD;  Location: [place];  Service: Thoracic;;  . KNEE ARTHROSCOPY Right   . SEPTOPLASTY  1980   x 2  . TONSILLECTOMY AND ADENOIDECTOMY  1960  . TOTAL KNEE ARTHROPLASTY Right 07/30/2019  . TOTAL KNEE ARTHROPLASTY Right 07/30/2019   Procedure: RIGHT TOTAL KNEE ARTHROPLASTY;  Surgeon: Mcarthur Rossetti, MD;  Location: [place];  Service: Orthopedics;  Laterality: Right;  Marland Kitchen VIDEO ASSISTED THORACOSCOPY (VATS)/WEDGE RESECTION Right 01/20/2019   Procedure: VIDEO ASSISTED  THORACOSCOPY (VATS) WITH RESECTION OF RIGHT INTRAPULMONARY LYMPH NODE;  Surgeon: Grace Isaac, MD;  Location: [place];  Service: Thoracic;  Laterality: Right;  Marland Kitchen VIDEO BRONCHOSCOPY N/A 01/20/2019   Procedure: VIDEO BRONCHOSCOPY;  Surgeon: Grace Isaac, MD;  Location: [facility] OR;  Service: Thoracic;  Laterality: N/A;    There were no vitals filed for this visit.  Subjective Assessment - 09/03/19 0934    Subjective  Things are about the same. Didn't notice anything different with the tape    Patient Stated Goals  Want to be able to cut toenails on his right foot    Currently in Pain?  No/denies         [facility] PT Assessment - 09/03/19 0001      AROM   AROM Assessment Site  Knee    Right/Left Knee  Right    Right Knee Extension  -10    Right Knee Flexion  108      PROM   PROM Assessment Site  Knee    Right/Left Knee  Right    Right Knee Extension  -6    Right Knee Flexion  116                   [facility] Adult PT Treatment/Exercise - 09/03/19 0001  Knee/Hip Exercises: Stretches   Knee: Self-Stretch to increase Flexion  Right    Knee: Self-Stretch Limitations  10 reps then prolonged hold 2 x 20 sec      Knee/Hip Exercises: Aerobic   Stationary Bike  partial revolutions for ROM x 5 min      Knee/Hip Exercises: Machines for Strengthening   Cybex Leg Press  50# x 30; 60 x 10      Modalities   Modalities  Vasopneumatic      Vasopneumatic   Number Minutes Vasopneumatic   10 minutes    Vasopnuematic Location   Knee    Vasopneumatic Pressure  Medium    Vasopneumatic Temperature   34 deg      Manual Therapy   Manual Therapy  Soft tissue mobilization;Joint mobilization;Passive ROM    Joint Mobilization  for pain relief to knee; patellar mobs    Soft tissue mobilization  IASTM to distal quad    Passive ROM  into flex and ext supine and sitting               PT Short Term Goals - 08/27/19 2118      PT SHORT TERM GOAL #1   Title  Ind with initial  HEP    Status  New    Target Date  09/24/19      PT SHORT TERM GOAL #2   Title  Pt able to climb stairs with a reciprocal gait pattern    Status  New    Target Date  09/24/19        PT Long Term Goals - 08/27/19 2118      PT LONG TERM GOAL #1   Title  Pt to demo right knee flex 0-125 to normalize gait and ADLs    Time  8    Period  Weeks    Status  New    Target Date  10/22/19      PT LONG TERM GOAL #2   Title  Patient able to perform ADLs without increased pain in the right knee.    Time  8    Period  Weeks    Status  New      PT LONG TERM GOAL #3   Title  Pt to demo 5/5 RLE strength    Time  8    Period  Weeks    Status  New      PT LONG TERM GOAL #4   Title  Patient able to sleep 6 hours without waking from knee pain    Time  8    Period  Weeks    Status  New      PT LONG TERM GOAL #5   Title  Patient able to cut his toenails without difficulty    Time  8    Period  Weeks    Status  New            Plan - 09/03/19 1015    Clinical Impression Statement  Patient progressing with knee ROM. PT more aggressive today with stretching and pt tolerated fair. Good patellar mobility with mobs. Still lacking extension likely due to edema. We did trial of vaso today.    PT Frequency  2x / week    PT Duration  8 weeks    PT Treatment/Interventions  ADLs/Self Care Home Management;Electrical Stimulation;Cryotherapy;Gait training;Stair training;Therapeutic exercise;Balance training;Neuromuscular re-education;Patient/family education;Manual techniques;Passive range of motion;Taping;Vasopneumatic Device;Joint Manipulations    PT Next Visit Plan  knee ROM/strength; vaso;  gait/balance    PT Home Exercise Plan  [email or web address]    Consulted and Agree with Plan of Care  Patient       Patient will benefit from skilled therapeutic intervention in order to improve the following deficits and impairments:  Abnormal gait, Decreased range of motion, Pain, Increased edema, Decreased  strength, Impaired flexibility  Visit Diagnosis: Stiffness of right knee, not elsewhere classified  Acute pain of right knee  Localized edema  Other abnormalities of gait and mobility     Problem List Patient Active Problem List   Diagnosis Date Noted  . Unilateral primary osteoarthritis, right knee 07/30/2019  . 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 COLONIC POLYPS, ADENOMATOUS 05/24/2008  . DIVERTICULOSIS, COLON 05/24/2008    Madelyn Flavors PT 09/03/2019, 3:08 PM  [facility] [address], [place], [place], [address] Phone: [phone]   Fax:  [phone]  Name: ABLE JACUINDE MRN: [id number] Date of Birth: [birth year]

## 2019-09-08 ENCOUNTER — Encounter: Payer: Self-pay | Admitting: Physical Therapy

## 2019-09-08 ENCOUNTER — Other Ambulatory Visit: Payer: Self-pay

## 2019-09-08 ENCOUNTER — Ambulatory Visit: Payer: Medicare Other | Admitting: Physical Therapy

## 2019-09-08 DIAGNOSIS — M25661 Stiffness of right knee, not elsewhere classified: Secondary | ICD-10-CM

## 2019-09-08 DIAGNOSIS — R600 Localized edema: Secondary | ICD-10-CM

## 2019-09-08 DIAGNOSIS — R2689 Other abnormalities of gait and mobility: Secondary | ICD-10-CM

## 2019-09-08 DIAGNOSIS — M25561 Pain in right knee: Secondary | ICD-10-CM

## 2019-09-08 NOTE — Therapy (Signed)
[facility] [address], [place], [place], [address] Phone: [phone]   Fax:  [phone]  Physical Therapy Treatment  Patient Details  Name: [name] MRN: [id number] Date of Birth: September 16, 1950 Referring Provider (PT): Jean Rosenthal   Encounter Date: 09/08/2019  PT End of Session - 09/08/19 0933    Visit Number  4    Date for PT Re-Evaluation  10/22/19    Authorization Type  UHC Medicare    PT Start Time  0933    PT Stop Time  1021    PT Time Calculation (min)  48 min    Activity Tolerance  Patient tolerated treatment well    Behavior During Therapy  [facility] for tasks assessed/performed       Past Medical History:  Diagnosis Date  . Arthritis    knee, no meds  . Cancer [facility]) 2020   carcinoid tumor of right lung  . 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 Eczema 08/08/2010  . Family history of adverse reaction to anesthesia    mom- post op N/V  . GERD (gastroesophageal reflux disease)    weight loss resolved gerd, no medication  . Hyperlipidemia   . PONV (postoperative nausea and vomiting)   . Sleep apnea    uses CPAP nightly  . Unilateral primary osteoarthritis, right knee 07/30/2019    Past Surgical History:  Procedure Laterality Date  . COLONOSCOPY     hx polyps  . EYE SURGERY     lasik  . INTERCOSTAL NERVE BLOCK  01/20/2019   Procedure: Intercostal Nerve Block;  Surgeon: Grace Isaac, MD;  Location: [place];  Service: Thoracic;;  . KNEE ARTHROSCOPY Right   . SEPTOPLASTY  1980   x 2  . TONSILLECTOMY AND ADENOIDECTOMY  1960  . TOTAL KNEE ARTHROPLASTY Right 07/30/2019  . TOTAL KNEE ARTHROPLASTY Right 07/30/2019   Procedure: RIGHT TOTAL KNEE ARTHROPLASTY;  Surgeon: Mcarthur Rossetti, MD;  Location: [place];  Service: Orthopedics;  Laterality: Right;  Marland Kitchen VIDEO ASSISTED THORACOSCOPY (VATS)/WEDGE RESECTION Right 01/20/2019   Procedure: VIDEO ASSISTED THORACOSCOPY (VATS) WITH RESECTION OF  RIGHT INTRAPULMONARY LYMPH NODE;  Surgeon: Grace Isaac, MD;  Location: [place];  Service: Thoracic;  Laterality: Right;  Marland Kitchen VIDEO BRONCHOSCOPY N/A 01/20/2019   Procedure: VIDEO BRONCHOSCOPY;  Surgeon: Grace Isaac, MD;  Location: [facility] OR;  Service: Thoracic;  Laterality: N/A;    There were no vitals filed for this visit.  Subjective Assessment - 09/08/19 0936    Subjective  I'm trying to build up my walking. It's sore as a boil afterwards. The ice and compression really helped.    Patient Stated Goals  Want to be able to cut toenails on his right foot    Currently in Pain?  Yes    Pain Score  3     Pain Location  Knee    Pain Orientation  Right    Pain Descriptors / Indicators  Discomfort    Pain Type  Surgical pain                       [facility] Adult PT Treatment/Exercise - 09/08/19 0001      Knee/Hip Exercises: Stretches   Knee: Self-Stretch to increase Flexion  Right    Knee: Self-Stretch Limitations  10 reps with 5 sec hold      Knee/Hip Exercises: Aerobic   Stationary Bike  full revolutions x 5 min  Knee/Hip Exercises: Machines for Strengthening   Cybex Leg Press  60# x 30      Knee/Hip Exercises: Standing   Lateral Step Up  Both;1 set;Hand Hold: 1;Step Height: 8";10 reps    Lateral Step Up Limitations  with contralateral hip ABD    Forward Step Up  20 reps;Hand Hold: 0;Step Height: 8"    Step Down  Left;Step Height: 4";Step Height: 6";20 reps    Step Down Limitations  heel taps for eccentric quad    Wall Squat  15 reps    Wall Squat Limitations  left foot on toes for wt shift right    Gait Training  exaggerated step/stride length x 180 feet, then normal gait x 60 ft using mirror for visual feedback      Modalities   Modalities  Vasopneumatic      Vasopneumatic   Number Minutes Vasopneumatic   10 minutes    Vasopnuematic Location   Knee    Vasopneumatic Pressure  Medium    Vasopneumatic Temperature   34 deg      Manual Therapy   Manual  Therapy  Soft tissue mobilization;Joint mobilization    Joint Mobilization  patellar mobs    Soft tissue mobilization  IASTM around incision               PT Short Term Goals - 08/27/19 2118      PT SHORT TERM GOAL #1   Title  Ind with initial HEP    Status  New    Target Date  09/24/19      PT SHORT TERM GOAL #2   Title  Pt able to climb stairs with a reciprocal gait pattern    Status  New    Target Date  09/24/19        PT Long Term Goals - 08/27/19 2118      PT LONG TERM GOAL #1   Title  Pt to demo right knee flex 0-125 to normalize gait and ADLs    Time  8    Period  Weeks    Status  New    Target Date  10/22/19      PT LONG TERM GOAL #2   Title  Patient able to perform ADLs without increased pain in the right knee.    Time  8    Period  Weeks    Status  New      PT LONG TERM GOAL #3   Title  Pt to demo 5/5 RLE strength    Time  8    Period  Weeks    Status  New      PT LONG TERM GOAL #4   Title  Patient able to sleep 6 hours without waking from knee pain    Time  8    Period  Weeks    Status  New      PT LONG TERM GOAL #5   Title  Patient able to cut his toenails without difficulty    Time  8    Period  Weeks    Status  New            Plan - 09/08/19 1014    Clinical Impression Statement  Patient gaining ROM and able to complete full revolutions on bike today. Tolerated new TE on stairs well. Good response to vaso last treatment so we continued with it.    PT Treatment/Interventions  ADLs/Self Care Home Management;Electrical Stimulation;Cryotherapy;Gait training;Stair training;Therapeutic exercise;Balance training;Neuromuscular  re-education;Patient/family education;Manual techniques;Passive range of motion;Taping;Vasopneumatic Device;Joint Manipulations    PT Next Visit Plan  MD note/goals. knee ROM/strength; vaso; gait/balance       Patient will benefit from skilled therapeutic intervention in order to improve the following deficits  and impairments:  Abnormal gait, Decreased range of motion, Pain, Increased edema, Decreased strength, Impaired flexibility  Visit Diagnosis: Stiffness of right knee, not elsewhere classified  Acute pain of right knee  Localized edema  Other abnormalities of gait and mobility     Problem List Patient Active Problem List   Diagnosis Date Noted  . Unilateral primary osteoarthritis, right knee 07/30/2019  . 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 COLONIC POLYPS, ADENOMATOUS 05/24/2008  . DIVERTICULOSIS, COLON 05/24/2008    Madelyn Flavors PT 09/08/2019, 1:14 PM  [facility] [address], [place], [place], [address] Phone: [phone]   Fax:  [phone]  Name: [name] MRN: [id number] Date of Birth: 1950-07-25

## 2019-09-10 ENCOUNTER — Other Ambulatory Visit: Payer: Self-pay

## 2019-09-10 ENCOUNTER — Encounter: Payer: Self-pay | Admitting: Physical Therapy

## 2019-09-10 ENCOUNTER — Ambulatory Visit: Payer: Medicare Other | Admitting: Physical Therapy

## 2019-09-10 DIAGNOSIS — M25661 Stiffness of right knee, not elsewhere classified: Secondary | ICD-10-CM | POA: Diagnosis not present

## 2019-09-10 DIAGNOSIS — R600 Localized edema: Secondary | ICD-10-CM

## 2019-09-10 DIAGNOSIS — R2689 Other abnormalities of gait and mobility: Secondary | ICD-10-CM

## 2019-09-10 DIAGNOSIS — M25561 Pain in right knee: Secondary | ICD-10-CM

## 2019-09-10 NOTE — Therapy (Signed)
[facility] [address], [place], [place], [address] Phone: [phone]   Fax:  [phone]  Physical Therapy Treatment  Patient Details  Name: [name] MRN: [id number] Date of Birth: [birth year] Referring Provider (PT): Jean Rosenthal   Encounter Date: 09/10/2019  PT End of Session - 09/10/19 0934    Visit Number  5    Date for PT Re-Evaluation  10/22/19    Authorization Type  UHC Medicare    Progress Note Due on Visit  10    PT Start Time  0934    PT Stop Time  1029    PT Time Calculation (min)  55 min    Activity Tolerance  Patient tolerated treatment well    Behavior During Therapy  [facility] for tasks assessed/performed       Past Medical History:  Diagnosis Date  . Arthritis    knee, no meds  . Cancer [facility]) 2020   carcinoid tumor of right lung  . 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 Eczema 08/08/2010  . Family history of adverse reaction to anesthesia    mom- post op N/V  . GERD (gastroesophageal reflux disease)    weight loss resolved gerd, no medication  . Hyperlipidemia   . PONV (postoperative nausea and vomiting)   . Sleep apnea    uses CPAP nightly  . Unilateral primary osteoarthritis, right knee 07/30/2019    Past Surgical History:  Procedure Laterality Date  . COLONOSCOPY     hx polyps  . EYE SURGERY     lasik  . INTERCOSTAL NERVE BLOCK  01/20/2019   Procedure: Intercostal Nerve Block;  Surgeon: Grace Isaac, MD;  Location: [place];  Service: Thoracic;;  . KNEE ARTHROSCOPY Right   . SEPTOPLASTY  1980   x 2  . TONSILLECTOMY AND ADENOIDECTOMY  1960  . TOTAL KNEE ARTHROPLASTY Right 07/30/2019  . TOTAL KNEE ARTHROPLASTY Right 07/30/2019   Procedure: RIGHT TOTAL KNEE ARTHROPLASTY;  Surgeon: Mcarthur Rossetti, MD;  Location: [place];  Service: Orthopedics;  Laterality: Right;  Marland Kitchen VIDEO ASSISTED THORACOSCOPY (VATS)/WEDGE RESECTION Right 01/20/2019   Procedure: VIDEO ASSISTED  THORACOSCOPY (VATS) WITH RESECTION OF RIGHT INTRAPULMONARY LYMPH NODE;  Surgeon: Grace Isaac, MD;  Location: [place];  Service: Thoracic;  Laterality: Right;  Marland Kitchen VIDEO BRONCHOSCOPY N/A 01/20/2019   Procedure: VIDEO BRONCHOSCOPY;  Surgeon: Grace Isaac, MD;  Location: [facility] OR;  Service: Thoracic;  Laterality: N/A;    There were no vitals filed for this visit.  Subjective Assessment - 09/10/19 0935    Subjective  I think the weather really affects it.    Pertinent History  OA    Currently in Pain?  Yes    Pain Score  2     Pain Location  Knee    Pain Orientation  Right    Pain Descriptors / Indicators  Sore;Tightness    Pain Type  Surgical pain         [facility] PT Assessment - 09/10/19 0001      AROM   AROM Assessment Site  Knee    Right/Left Knee  Right    Right Knee Extension  -10    Right Knee Flexion  108      PROM   PROM Assessment Site  Knee    Right/Left Knee  Right    Right Knee Extension  -6    Right Knee Flexion  116      Strength  Overall Strength Comments  Rt hip ext 5/5    Right Knee Flexion  5/5    Right Knee Extension  5/5      Palpation   Patella mobility  WNL                   [facility] Adult PT Treatment/Exercise - 09/10/19 0001      Knee/Hip Exercises: Stretches   Knee: Self-Stretch Limitations  demo of supine wall slide for increasing flexion using left foot and PT assist as wall today      Knee/Hip Exercises: Aerobic   Stationary Bike  Bike x 7 min today due to increased tightness. Partial to full revolutions in last 1.5 min      Knee/Hip Exercises: Machines for Strengthening   Cybex Leg Press  65# x 30 with stretch into ext      Knee/Hip Exercises: Supine   Straight Leg Raises  Right;10 reps      Knee/Hip Exercises: Prone   Hamstring Curl  10 reps      Modalities   Modalities  Vasopneumatic      Vasopneumatic   Number Minutes Vasopneumatic   15 minutes    Vasopnuematic Location   Knee    Vasopneumatic Pressure  Medium     Vasopneumatic Temperature   34      Manual Therapy   Manual Therapy  Joint mobilization;Passive ROM    Joint Mobilization  patellar mobs    Passive ROM  into flex and ext supine and sitting with contract/relax in prone for flexion             PT Education - 09/10/19 1423    Education Details  advised supine wall slide for increased knee flexion    Person(s) Educated  Patient    Methods  Explanation;Demonstration    Comprehension  Verbalized understanding;Returned demonstration       PT Short Term Goals - 09/10/19 0937      PT SHORT TERM GOAL #1   Title  Ind with initial HEP    Status  Partially Met      PT SHORT TERM GOAL #2   Title  Pt able to climb stairs with a reciprocal gait pattern    Baseline  going down is still painful    Status  Partially Met        PT Long Term Goals - 09/10/19 6967      PT LONG TERM GOAL #1   Title  Pt to demo right knee flex 0-125 to normalize gait and ADLs    Baseline  10-108 actively; 6-116 passively    Status  On-going      PT LONG TERM GOAL #2   Title  Patient able to perform ADLs without increased pain in the right knee.    Status  On-going      PT LONG TERM GOAL #3   Title  Pt to demo 5/5 RLE strength    Status  Achieved      PT LONG TERM GOAL #4   Title  Patient able to sleep 6 hours without waking from knee pain    Baseline  Patient getting more comfortable with knee at night, Still waking 2x/night    Status  On-going      PT LONG TERM GOAL #5   Title  Patient able to cut his toenails without difficulty    Status  On-going            Plan -  09/10/19 1424    Clinical Impression Statement  Patient making slow progress with ROM (active 10-108; passive 6-116). He still has a lot of edema likely affecting range as end feel is soft. Gait is improving and pt able to [facility] with visual cueing in mirror. He reports compliance with HEP, walking and icing. Strength goal has been met. He does have some functional  weakness in quads descending stairs.    PT Frequency  2x / week    PT Duration  8 weeks    PT Treatment/Interventions  ADLs/Self Care Home Management;Electrical Stimulation;Cryotherapy;Gait training;Stair training;Therapeutic exercise;Balance training;Neuromuscular re-education;Patient/family education;Manual techniques;Passive range of motion;Taping;Vasopneumatic Device;Joint Manipulations    PT Next Visit Plan  knee ROM/strength; vaso; gait/balance    PT Home Exercise Plan  [id number]    Consulted and Agree with Plan of Care  Patient       Patient will benefit from skilled therapeutic intervention in order to improve the following deficits and impairments:  Abnormal gait, Decreased range of motion, Pain, Increased edema, Decreased strength, Impaired flexibility  Visit Diagnosis: Stiffness of right knee, not elsewhere classified  Acute pain of right knee  Localized edema  Other abnormalities of gait and mobility     Problem List Patient Active Problem List   Diagnosis Date Noted  . Unilateral primary osteoarthritis, right knee 07/30/2019  . 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 COLONIC POLYPS, ADENOMATOUS 05/24/2008  . DIVERTICULOSIS, COLON 05/24/2008   Madelyn Flavors PT 09/10/2019, 2:33 PM  [facility] [address] [place], [place], [address] Phone: [phone]   Fax:  [phone]  Name: [name] MRN: [id number] Date of Birth: Nov 08, 1950

## 2019-09-14 ENCOUNTER — Other Ambulatory Visit: Payer: Self-pay

## 2019-09-14 ENCOUNTER — Encounter: Payer: Self-pay | Admitting: Orthopaedic Surgery

## 2019-09-14 ENCOUNTER — Ambulatory Visit (INDEPENDENT_AMBULATORY_CARE_PROVIDER_SITE_OTHER): Payer: Medicare Other | Admitting: Orthopaedic Surgery

## 2019-09-14 DIAGNOSIS — Z96651 Presence of right artificial knee joint: Secondary | ICD-10-CM

## 2019-09-14 NOTE — Progress Notes (Signed)
The patient is now 6 weeks status post a right total knee arthroplasty.  He is still having swelling in his knee but reports better strength and motion.  He is still having difficulty sleeping at night.  On examination of his right operative knee the incisions healed nicely.  He still does have moderate swelling which is to be expected.  His extension is almost full and his flexion today in the office is to about 100 degrees.  At this point I will have him try heat for his knee and I think this will help relax the muscles and help his body breakdown the fluid from the swelling in his knee.  I would not recommend any aspiration because this is more just broken down blood products and not a true effusion.  He understands that as well.  He will continue to aggressively push himself through therapy and motion.  He is off all narcotic pain medications as well.  I gave him reassurance that I feel that he is making good progress.  I would like to see him back in 4 weeks to see how he is doing in terms of his motion of his knee.  No x-rays are needed.

## 2019-09-15 ENCOUNTER — Encounter: Payer: Self-pay | Admitting: Physical Therapy

## 2019-09-15 ENCOUNTER — Ambulatory Visit: Payer: Medicare Other | Admitting: Physical Therapy

## 2019-09-15 DIAGNOSIS — M25661 Stiffness of right knee, not elsewhere classified: Secondary | ICD-10-CM | POA: Diagnosis not present

## 2019-09-15 DIAGNOSIS — R600 Localized edema: Secondary | ICD-10-CM

## 2019-09-15 DIAGNOSIS — M25561 Pain in right knee: Secondary | ICD-10-CM

## 2019-09-15 DIAGNOSIS — R2689 Other abnormalities of gait and mobility: Secondary | ICD-10-CM

## 2019-09-15 NOTE — Therapy (Signed)
[facility] [address] [place], [place], [address] Phone: [phone]   Fax:  [phone]  Physical Therapy Treatment  Patient Details  Name: [name] MRN: [id number] Date of Birth: [birth year] Referring Provider (PT): Jean Rosenthal   Encounter Date: 09/15/2019  PT End of Session - 09/15/19 0938    Visit Number  6    Date for PT Re-Evaluation  10/22/19    Authorization Type  UHC Medicare    Progress Note Due on Visit  10    PT Start Time  [phone]    PT Stop Time  1022    PT Time Calculation (min)  46 min    Activity Tolerance  Patient tolerated treatment well    Behavior During Therapy  [facility] for tasks assessed/performed       Past Medical History:  Diagnosis Date  . Arthritis    knee, no meds  . Cancer [facility]) 2020   carcinoid tumor of right lung  . 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 Eczema 08/08/2010  . Family history of adverse reaction to anesthesia    mom- post op N/V  . GERD (gastroesophageal reflux disease)    weight loss resolved gerd, no medication  . Hyperlipidemia   . PONV (postoperative nausea and vomiting)   . Sleep apnea    uses CPAP nightly  . Unilateral primary osteoarthritis, right knee 07/30/2019    Past Surgical History:  Procedure Laterality Date  . COLONOSCOPY     hx polyps  . EYE SURGERY     lasik  . INTERCOSTAL NERVE BLOCK  01/20/2019   Procedure: Intercostal Nerve Block;  Surgeon: Grace Isaac, MD;  Location: [place];  Service: Thoracic;;  . KNEE ARTHROSCOPY Right   . SEPTOPLASTY  1980   x 2  . TONSILLECTOMY AND ADENOIDECTOMY  1960  . TOTAL KNEE ARTHROPLASTY Right 07/30/2019  . TOTAL KNEE ARTHROPLASTY Right 07/30/2019   Procedure: RIGHT TOTAL KNEE ARTHROPLASTY;  Surgeon: Mcarthur Rossetti, MD;  Location: [place];  Service: Orthopedics;  Laterality: Right;  Marland Kitchen VIDEO ASSISTED THORACOSCOPY (VATS)/WEDGE RESECTION Right 01/20/2019   Procedure: VIDEO ASSISTED  THORACOSCOPY (VATS) WITH RESECTION OF RIGHT INTRAPULMONARY LYMPH NODE;  Surgeon: Grace Isaac, MD;  Location: [place];  Service: Thoracic;  Laterality: Right;  Marland Kitchen VIDEO BRONCHOSCOPY N/A 01/20/2019   Procedure: VIDEO BRONCHOSCOPY;  Surgeon: Grace Isaac, MD;  Location: [facility] OR;  Service: Thoracic;  Laterality: N/A;    There were no vitals filed for this visit.  Subjective Assessment - 09/15/19 0938    Subjective  It feels the same. A little sore and tight.    Patient Stated Goals  Want to be able to cut toenails on his right foot    Currently in Pain?  Yes    Pain Score  2     Pain Location  Knee    Pain Orientation  Right    Pain Descriptors / Indicators  Tightness         [facility] PT Assessment - 09/15/19 0001      AROM   AROM Assessment Site  Knee    Right/Left Knee  Right    Right Knee Flexion  113      PROM   PROM Assessment Site  Knee    Right/Left Knee  Right    Right Knee Flexion  120  [place] Adult PT Treatment/Exercise - 09/15/19 0001      Knee/Hip Exercises: Aerobic   Stationary Bike  Bike x 7 min with moving seat forward for ROM      Knee/Hip Exercises: Machines for Strengthening   Cybex Leg Press  70# x 40 with prolonged hold into ext      Knee/Hip Exercises: Standing   Heel Raises  20 reps;Both;5 seconds    Heel Raises Limitations  hold on stretch off step    Forward Lunges  Right;5 reps    SLS  on level, on blue foam with ball toss multiple reps    SLS with Vectors  on level 2 reps 3 points    Other Standing Knee Exercises  tandem stance and tandem stance with head turns      Vasopneumatic   Number Minutes Vasopneumatic   10 minutes    Vasopnuematic Location   Knee    Vasopneumatic Pressure  Medium    Vasopneumatic Temperature   34      Manual Therapy   Manual Therapy  Passive ROM    Passive ROM  into flex and ext               PT Short Term Goals - 09/15/19 1020      PT SHORT TERM GOAL #1   Title  Ind  with initial HEP    Status  Achieved        PT Long Term Goals - 09/15/19 1020      PT LONG TERM GOAL #1   Title  Pt to demo right knee flex 0-125 to normalize gait and ADLs    Status  On-going      PT LONG TERM GOAL #4   Title  Patient able to sleep 6 hours without waking from knee pain    Baseline  Had one night where he slept throught the night. Generally still waking 2x/night    Status  On-going            Plan - 09/15/19 1017    Clinical Impression Statement  Patient continuing to progress with knee flexion (120 deg) today. He did fairly well with balance activities but was challenged. Knee is moving easier today. Patient reports sleeping fully through the night for one night.    Examination-Activity Limitations  Sleep    PT Frequency  2x / week    PT Treatment/Interventions  ADLs/Self Care Home Management;Electrical Stimulation;Cryotherapy;Gait training;Stair training;Therapeutic exercise;Balance training;Neuromuscular re-education;Patient/family education;Manual techniques;Passive range of motion;Taping;Vasopneumatic Device;Joint Manipulations    PT Next Visit Plan  Work on seated pirifomis stretch. knee ROM/strength; vaso; gait/balance    PT Home Exercise Plan  [id number]    Consulted and Agree with Plan of Care  Patient       Patient will benefit from skilled therapeutic intervention in order to improve the following deficits and impairments:  Abnormal gait, Decreased range of motion, Pain, Increased edema, Decreased strength, Impaired flexibility  Visit Diagnosis: Stiffness of right knee, not elsewhere classified  Acute pain of right knee  Localized edema  Other abnormalities of gait and mobility     Problem List Patient Active Problem List   Diagnosis Date Noted  . Unilateral primary osteoarthritis, right knee 07/30/2019  . 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 COLONIC POLYPS, ADENOMATOUS 05/24/2008   . DIVERTICULOSIS, COLON 05/24/2008    Madelyn Flavors PT 09/15/2019, 10:28 AM  [place] [address], [place], [place], [address] Phone: [phone]   Fax:  [phone]  Name: AUREL MUSA MRN: [id number] Date of Birth: [birth year]

## 2019-09-17 ENCOUNTER — Ambulatory Visit: Payer: Medicare Other | Admitting: Physical Therapy

## 2019-09-17 ENCOUNTER — Other Ambulatory Visit: Payer: Self-pay

## 2019-09-17 ENCOUNTER — Encounter: Payer: Self-pay | Admitting: Physical Therapy

## 2019-09-17 DIAGNOSIS — R2689 Other abnormalities of gait and mobility: Secondary | ICD-10-CM

## 2019-09-17 DIAGNOSIS — M25561 Pain in right knee: Secondary | ICD-10-CM

## 2019-09-17 DIAGNOSIS — M25661 Stiffness of right knee, not elsewhere classified: Secondary | ICD-10-CM | POA: Diagnosis not present

## 2019-09-17 DIAGNOSIS — R600 Localized edema: Secondary | ICD-10-CM

## 2019-09-17 NOTE — Therapy (Signed)
[facility] [address] [place], [place], [address] Phone: [phone]   Fax:  [phone]  Physical Therapy Treatment  Patient Details  Name: [name] MRN: [id number] Date of Birth: July 18, 1950 Referring Provider (PT): Jean Rosenthal   Encounter Date: 09/17/2019  PT End of Session - 09/17/19 0935    Visit Number  7    Date for PT Re-Evaluation  10/22/19    Authorization Type  [employer] Medicare    Progress Note Due on Visit  10    PT Start Time  0933    PT Stop Time  1022    PT Time Calculation (min)  49 min    Activity Tolerance  Patient tolerated treatment well    Behavior During Therapy  [facility] for tasks assessed/performed       Past Medical History:  Diagnosis Date  . Arthritis    knee, no meds  . Cancer [facility]) 2020   carcinoid tumor of right lung  . 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 Eczema 08/08/2010  . Family history of adverse reaction to anesthesia    mom- post op N/V  . GERD (gastroesophageal reflux disease)    weight loss resolved gerd, no medication  . Hyperlipidemia   . PONV (postoperative nausea and vomiting)   . Sleep apnea    uses CPAP nightly  . Unilateral primary osteoarthritis, right knee 07/30/2019    Past Surgical History:  Procedure Laterality Date  . COLONOSCOPY     hx polyps  . EYE SURGERY     lasik  . INTERCOSTAL NERVE BLOCK  01/20/2019   Procedure: Intercostal Nerve Block;  Surgeon: Grace Isaac, MD;  Location: [place];  Service: Thoracic;;  . KNEE ARTHROSCOPY Right   . SEPTOPLASTY  1980   x 2  . TONSILLECTOMY AND ADENOIDECTOMY  1960  . TOTAL KNEE ARTHROPLASTY Right 07/30/2019  . TOTAL KNEE ARTHROPLASTY Right 07/30/2019   Procedure: RIGHT TOTAL KNEE ARTHROPLASTY;  Surgeon: Mcarthur Rossetti, MD;  Location: [place];  Service: Orthopedics;  Laterality: Right;  Marland Kitchen VIDEO ASSISTED THORACOSCOPY (VATS)/WEDGE RESECTION Right 01/20/2019   Procedure: VIDEO ASSISTED  THORACOSCOPY (VATS) WITH RESECTION OF RIGHT INTRAPULMONARY LYMPH NODE;  Surgeon: Grace Isaac, MD;  Location: [place];  Service: Thoracic;  Laterality: Right;  Marland Kitchen VIDEO BRONCHOSCOPY N/A 01/20/2019   Procedure: VIDEO BRONCHOSCOPY;  Surgeon: Grace Isaac, MD;  Location: [facility] OR;  Service: Thoracic;  Laterality: N/A;    There were no vitals filed for this visit.  Subjective Assessment - 09/17/19 0935    Subjective  the usual soreness.    Pertinent History  OA    Patient Stated Goals  Want to be able to cut toenails on his right foot    Currently in Pain?  No/denies         [facility] PT Assessment - 09/17/19 0001      AROM   Right Knee Extension  -6                   [facility] Adult PT Treatment/Exercise - 09/17/19 0001      Knee/Hip Exercises: Aerobic   Stationary Bike  Bike x 7 min with moving seat forward for ROM      Knee/Hip Exercises: Machines for Strengthening   Cybex Knee Extension  15# x 20    Cybex Knee Flexion  15# right only x 20    Cybex Leg Press  70# x 20 with prolonged  hold into ext bil; unilateral just right 50# 2x10    Other Machine  heel raises 50# x 10      Knee/Hip Exercises: Standing   Step Down  Left;Step Height: 4";Step Height: 6";20 reps    Step Down Limitations  heel taps for eccentric quad    SLS with Vectors  on level 5 reps 3 points    Other Standing Knee Exercises  tandem stance and tandem stance with head turns; lunge with rotation x 10 ea using red weighted ball      Modalities   Modalities  Vasopneumatic      Vasopneumatic   Number Minutes Vasopneumatic   10 minutes    Vasopnuematic Location   Knee    Vasopneumatic Pressure  Medium    Vasopneumatic Temperature   34      Manual Therapy   Manual Therapy  Passive ROM    Joint Mobilization  patellar mobs; post mob for ext    Passive ROM  into extension                PT Short Term Goals - 09/15/19 1020      PT SHORT TERM GOAL #1   Title  Ind with initial HEP     Status  Achieved        PT Long Term Goals - 09/15/19 1020      PT LONG TERM GOAL #1   Title  Pt to demo right knee flex 0-125 to normalize gait and ADLs    Status  On-going      PT LONG TERM GOAL #4   Title  Patient able to sleep 6 hours without waking from knee pain    Baseline  Had one night where he slept throught the night. Generally still waking 2x/night    Status  On-going            Plan - 09/17/19 1450    Clinical Impression Statement  Patient able to progress with strengthening today. Still has difficulty descending stairs without hip compensation, but form improves after heel tap exercise. Balance much better in tandem stance today versus last visit.    PT Frequency  2x / week    PT Duration  8 weeks    PT Treatment/Interventions  ADLs/Self Care Home Management;Electrical Stimulation;Cryotherapy;Gait training;Stair training;Therapeutic exercise;Balance training;Neuromuscular re-education;Patient/family education;Manual techniques;Passive range of motion;Taping;Vasopneumatic Device;Joint Manipulations    PT Next Visit Plan  Work on seated pirifomis stretch. knee ROM/strength; vaso; gait/balance    PT Home Exercise Plan  [facility]       Patient will benefit from skilled therapeutic intervention in order to improve the following deficits and impairments:  Abnormal gait, Decreased range of motion, Pain, Increased edema, Decreased strength, Impaired flexibility  Visit Diagnosis: Stiffness of right knee, not elsewhere classified  Acute pain of right knee  Localized edema  Other abnormalities of gait and mobility     Problem List Patient Active Problem List   Diagnosis Date Noted  . Unilateral primary osteoarthritis, right knee 07/30/2019  . 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 COLONIC POLYPS, ADENOMATOUS 05/24/2008  . DIVERTICULOSIS, COLON 05/24/2008   Madelyn Flavors PT 09/17/2019, 2:53 PM  [facility] [address] [place], [place], [address] Phone: [phone]   Fax:  [phone]  Name: KYMIER CLEVERLY MRN: [id number] Date of Birth: Apr 25, 1951

## 2019-09-21 ENCOUNTER — Ambulatory Visit: Payer: Medicare Other

## 2019-09-21 ENCOUNTER — Other Ambulatory Visit: Payer: Self-pay

## 2019-09-21 DIAGNOSIS — M25661 Stiffness of right knee, not elsewhere classified: Secondary | ICD-10-CM | POA: Diagnosis not present

## 2019-09-21 DIAGNOSIS — R600 Localized edema: Secondary | ICD-10-CM

## 2019-09-21 DIAGNOSIS — R2689 Other abnormalities of gait and mobility: Secondary | ICD-10-CM

## 2019-09-21 DIAGNOSIS — M25561 Pain in right knee: Secondary | ICD-10-CM

## 2019-09-21 NOTE — Therapy (Signed)
[facility] [address] [place], [place], [address] Phone: [phone]   Fax:  [phone]  Physical Therapy Treatment  Patient Details  Name: [name] MRN: [id number] Date of Birth: [birth year] Referring Provider (PT): Jean Rosenthal   Encounter Date: 09/21/2019  PT End of Session - 09/21/19 1059    Visit Number  8    Date for PT Re-Evaluation  10/22/19    Authorization Type  [employer] Medicare    PT Start Time  1016    PT Stop Time  1113    PT Time Calculation (min)  57 min    Activity Tolerance  Patient tolerated treatment well    Behavior During Therapy  [facility] for tasks assessed/performed       Past Medical History:  Diagnosis Date  . Arthritis    knee, no meds  . Cancer [facility]) 2020   carcinoid tumor of right lung  . 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 Eczema 08/08/2010  . Family history of adverse reaction to anesthesia    mom- post op N/V  . GERD (gastroesophageal reflux disease)    weight loss resolved gerd, no medication  . Hyperlipidemia   . PONV (postoperative nausea and vomiting)   . Sleep apnea    uses CPAP nightly  . Unilateral primary osteoarthritis, right knee 07/30/2019    Past Surgical History:  Procedure Laterality Date  . COLONOSCOPY     hx polyps  . EYE SURGERY     lasik  . INTERCOSTAL NERVE BLOCK  01/20/2019   Procedure: Intercostal Nerve Block;  Surgeon: Grace Isaac, MD;  Location: [place];  Service: Thoracic;;  . KNEE ARTHROSCOPY Right   . SEPTOPLASTY  1980   x 2  . TONSILLECTOMY AND ADENOIDECTOMY  1960  . TOTAL KNEE ARTHROPLASTY Right 07/30/2019  . TOTAL KNEE ARTHROPLASTY Right 07/30/2019   Procedure: RIGHT TOTAL KNEE ARTHROPLASTY;  Surgeon: Mcarthur Rossetti, MD;  Location: [place];  Service: Orthopedics;  Laterality: Right;  Marland Kitchen VIDEO ASSISTED THORACOSCOPY (VATS)/WEDGE RESECTION Right 01/20/2019   Procedure: VIDEO ASSISTED THORACOSCOPY (VATS) WITH RESECTION OF  RIGHT INTRAPULMONARY LYMPH NODE;  Surgeon: Grace Isaac, MD;  Location: [place];  Service: Thoracic;  Laterality: Right;  Marland Kitchen VIDEO BRONCHOSCOPY N/A 01/20/2019   Procedure: VIDEO BRONCHOSCOPY;  Surgeon: Grace Isaac, MD;  Location: [facility] OR;  Service: Thoracic;  Laterality: N/A;    There were no vitals filed for this visit.  Subjective Assessment - 09/21/19 1019    Subjective  I am having the ususal stiffness and soreness.    Currently in Pain?  No/denies                       [facility] Adult PT Treatment/Exercise - 09/21/19 0001      Knee/Hip Exercises: Stretches   Active Hamstring Stretch  Right;3 reps;20 seconds      Knee/Hip Exercises: Aerobic   Stationary Bike  Bike Level 2 x 7 min with moving seat forward for ROM      Knee/Hip Exercises: Machines for Strengthening   Cybex Knee Extension  15# x 20, up with both, eccentric on the Rt 10# x 10    Cybex Knee Flexion  20# right only x 20    Cybex Leg Press  seat 7: 85# x 20 with prolonged hold into ext bil; unilateral just right 50# 2x10    Other Machine  heel raises 50# x 10  Knee/Hip Exercises: Standing   Step Down  Right;2 sets;10 reps;Step Height: 6"    Rocker Board  3 minutes    Walking with Sports Cord  25# forward and reverse x 15 reps each.      Vasopneumatic   Number Minutes Vasopneumatic   15 minutes    Vasopnuematic Location   Knee    Vasopneumatic Pressure  Medium    Vasopneumatic Temperature   34      Manual Therapy   Manual Therapy  Passive ROM    Joint Mobilization  patellar mobs; post mob for ext    Passive ROM  into extension                PT Short Term Goals - 09/15/19 1020      PT SHORT TERM GOAL #1   Title  Ind with initial HEP    Status  Achieved        PT Long Term Goals - 09/15/19 1020      PT LONG TERM GOAL #1   Title  Pt to demo right knee flex 0-125 to normalize gait and ADLs    Status  On-going      PT LONG TERM GOAL #4   Title  Patient able to sleep 6  hours without waking from knee pain    Baseline  Had one night where he slept throught the night. Generally still waking 2x/night    Status  On-going            Plan - 09/21/19 1031    Clinical Impression Statement  Pt is making steady progress s/p TKA.  Pt with biggest challenge with descending steps and lacks eccentric control on 6" step today.  Pt did well with resisted walking with good balance and symmetry with this today. Pt requires minor verbal cues for alignment and technique with exercise.  Pt tolerated advancement of weights today. Pt with moderate edema in the Rt knee and responds well to vasopnuematic device post session.  Pt will continue to benefit from skilled PT to address Lt Rt knee flexibility, strength and edema management s/p TKA.    PT Frequency  2x / week    PT Duration  8 weeks    PT Treatment/Interventions  ADLs/Self Care Home Management;Electrical Stimulation;Cryotherapy;Gait training;Stair training;Therapeutic exercise;Balance training;Neuromuscular re-education;Patient/family education;Manual techniques;Passive range of motion;Taping;Vasopneumatic Device;Joint Manipulations    PT Next Visit Plan  Work on seated pirifomis stretch. knee ROM/strength; vaso; gait/balance    PT Home Exercise Plan  [facility]    Recommended Other Services  initial certification is signed.    Consulted and Agree with Plan of Care  Patient       Patient will benefit from skilled therapeutic intervention in order to improve the following deficits and impairments:  Abnormal gait, Decreased range of motion, Pain, Increased edema, Decreased strength, Impaired flexibility  Visit Diagnosis: Acute pain of right knee  Stiffness of right knee, not elsewhere classified  Localized edema  Other abnormalities of gait and mobility     Problem List Patient Active Problem List   Diagnosis Date Noted  . Unilateral primary osteoarthritis, right knee 07/30/2019  . Status post total right knee  replacement 07/30/2019  . 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 COLONIC POLYPS, ADENOMATOUS 05/24/2008  . DIVERTICULOSIS, COLON 05/24/2008     Sigurd Sos, PT 09/21/19 11:00 AM  [facility] [address], [place], [place], [address] Phone: [phone]   Fax:  [phone]  Name: YUVIN DUBEL MRN: [id number] Date of Birth: Oct 26, 1950

## 2019-09-22 NOTE — Telephone Encounter (Signed)
disregard

## 2019-09-23 ENCOUNTER — Ambulatory Visit: Payer: Medicare Other

## 2019-09-23 ENCOUNTER — Other Ambulatory Visit: Payer: Self-pay

## 2019-09-23 DIAGNOSIS — M25661 Stiffness of right knee, not elsewhere classified: Secondary | ICD-10-CM

## 2019-09-23 DIAGNOSIS — R2689 Other abnormalities of gait and mobility: Secondary | ICD-10-CM

## 2019-09-23 DIAGNOSIS — R600 Localized edema: Secondary | ICD-10-CM

## 2019-09-23 DIAGNOSIS — M25561 Pain in right knee: Secondary | ICD-10-CM

## 2019-09-23 NOTE — Therapy (Signed)
[facility] [address], [place], [place], [address] Phone: [phone]   Fax:  [phone]  Physical Therapy Treatment  Patient Details  Name: [name] MRN: [id number] Date of Birth: 1951/06/16 Referring Provider ([name]): Jean Rosenthal   Encounter Date: 09/23/2019  [name] End of Session - 09/23/19 1014    Visit Number  9    Date for [name] Re-Evaluation  10/22/19    Authorization Type  [employer] Medicare    Progress Note Due on Visit  10    [name] Start Time  0932    [name] Stop Time  1027    [name] Time Calculation (min)  55 min    Activity Tolerance  Patient tolerated treatment well    Behavior During Therapy  [facility] for tasks assessed/performed       Past Medical History:  Diagnosis Date  . Arthritis    knee, no meds  . Cancer [facility]) 2020   carcinoid tumor of right lung  . 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 Eczema 08/08/2010  . Family history of adverse reaction to anesthesia    mom- post op N/V  . GERD (gastroesophageal reflux disease)    weight loss resolved gerd, no medication  . Hyperlipidemia   . PONV (postoperative nausea and vomiting)   . Sleep apnea    uses CPAP nightly  . Unilateral primary osteoarthritis, right knee 07/30/2019    Past Surgical History:  Procedure Laterality Date  . COLONOSCOPY     hx polyps  . EYE SURGERY     lasik  . INTERCOSTAL NERVE BLOCK  01/20/2019   Procedure: Intercostal Nerve Block;  Surgeon: Grace Isaac, MD;  Location: [place];  Service: Thoracic;;  . KNEE ARTHROSCOPY Right   . SEPTOPLASTY  1980   x 2  . TONSILLECTOMY AND ADENOIDECTOMY  1960  . TOTAL KNEE ARTHROPLASTY Right 07/30/2019  . TOTAL KNEE ARTHROPLASTY Right 07/30/2019   Procedure: RIGHT TOTAL KNEE ARTHROPLASTY;  Surgeon: Mcarthur Rossetti, MD;  Location: [place];  Service: Orthopedics;  Laterality: Right;  Marland Kitchen VIDEO ASSISTED THORACOSCOPY (VATS)/WEDGE RESECTION Right 01/20/2019   Procedure: VIDEO ASSISTED  THORACOSCOPY (VATS) WITH RESECTION OF RIGHT INTRAPULMONARY LYMPH NODE;  Surgeon: Grace Isaac, MD;  Location: [place];  Service: Thoracic;  Laterality: Right;  Marland Kitchen VIDEO BRONCHOSCOPY N/A 01/20/2019   Procedure: VIDEO BRONCHOSCOPY;  Surgeon: Grace Isaac, MD;  Location: [facility] OR;  Service: Thoracic;  Laterality: N/A;    There were no vitals filed for this visit.  Subjective Assessment - 09/23/19 0939    Subjective  I am doing well.  No complaints today.    Currently in Pain?  Yes    Pain Location  Knee                       [facility] Adult [name] Treatment/Exercise - 09/23/19 0001      Knee/Hip Exercises: Stretches   Active Hamstring Stretch  Right;3 reps;20 seconds      Knee/Hip Exercises: Aerobic   Nustep  Level 5x 8 minutes -legs only with quad activation      Knee/Hip Exercises: Machines for Strengthening   Cybex Knee Extension  15# x 20, up with both, eccentric on the Rt 10# x 10    Cybex Knee Flexion  20# right only x 20    Cybex Leg Press  seat 7: 85# x 20 with prolonged hold into ext bil; unilateral just right 50# 2x10  Knee/Hip Exercises: Standing   Step Down  --    Rocker Board  3 minutes    Walking with Sports Cord  30# forward and reverse x 10 each, sidestepping 20# x 10 each    Other Standing Knee Exercises  single leg on Rt with slider vectors on the Lt       Vasopneumatic   Number Minutes Vasopneumatic   15 minutes    Vasopnuematic Location   Knee    Vasopneumatic Pressure  Medium    Vasopneumatic Temperature   34               [name] Short Term Goals - 09/23/19 0940      [name] SHORT TERM GOAL #2   Title  [name] able to climb stairs with a reciprocal gait pattern    Baseline  reciprocal with ascending, step-to with descending    Period  Weeks    Status  On-going        [name] Long Term Goals - 09/23/19 0940      [name] LONG TERM GOAL #2   Title  Patient able to perform ADLs without increased pain in the right knee.    Time  8    Period  Weeks     Status  On-going            Plan - 09/23/19 0948    Clinical Impression Statement  [name] is making steady progress s/p TKA.  [name] with biggest challenge with descending steps and continues to perform with step-to pattern.  [name] did well with resisted walking with good balance and symmetry with this today.  [name] tolerated increased weight with forward/reverse and addition of sidestepping motion with good control.  [name] requires minor verbal cues for alignment and technique with exercise. [name] with moderate edema in the Rt knee and responds well to vasopnuematic device post session.  [name] will continue to benefit from skilled [name] to address Lt Rt knee flexibility, strength and edema management s/p TKA.    [name] Frequency  2x / week    [name] Duration  8 weeks    [name] Treatment/Interventions  ADLs/Self Care Home Management;Electrical Stimulation;Cryotherapy;Gait training;Stair training;Therapeutic exercise;Balance training;Neuromuscular re-education;Patient/family education;Manual techniques;Passive range of motion;Taping;Vasopneumatic Device;Joint Manipulations    [name] Next Visit Plan  eccentric Rt knee strength,  knee ROM/strength; vaso; gait/balance.  10th visit progress note    [name] Home Exercise Plan  [id number]    Consulted and Agree with Plan of Care  Patient       Patient will benefit from skilled therapeutic intervention in order to improve the following deficits and impairments:  Abnormal gait, Decreased range of motion, Pain, Increased edema, Decreased strength, Impaired flexibility  Visit Diagnosis: Stiffness of right knee, not elsewhere classified  Acute pain of right knee  Localized edema  Other abnormalities of gait and mobility     Problem List Patient Active Problem List   Diagnosis Date Noted  . Unilateral primary osteoarthritis, right knee 07/30/2019  . 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 COLONIC POLYPS, ADENOMATOUS 05/24/2008  .  DIVERTICULOSIS, COLON 05/24/2008    [name], [name] 09/23/19 10:15 AM  [facility] [address], [place], [place], [address] Phone: [phone]   Fax:  [phone]  Name: [name] MRN: [id number] Date of Birth: 1951-04-13

## 2019-09-24 ENCOUNTER — Ambulatory Visit
Admission: RE | Admit: 2019-09-24 | Discharge: 2019-09-24 | Disposition: A | Discharge status: Home / Self Care | Payer: Medicare Other | Source: Ambulatory Visit | Attending: Cardiothoracic Surgery | Admitting: Cardiothoracic Surgery

## 2019-09-24 ENCOUNTER — Ambulatory Visit (INDEPENDENT_AMBULATORY_CARE_PROVIDER_SITE_OTHER): Payer: Medicare Other | Admitting: Cardiothoracic Surgery

## 2019-09-24 VITALS — BP 158/91 | HR 70 | Temp 97.7°F | Resp 20 | Ht 70.0 in | Wt 208.0 lb

## 2019-09-24 DIAGNOSIS — Z09 Encounter for follow-up examination after completed treatment for conditions other than malignant neoplasm: Secondary | ICD-10-CM

## 2019-09-24 DIAGNOSIS — C7A090 Malignant carcinoid tumor of the bronchus and lung: Secondary | ICD-10-CM

## 2019-09-24 DIAGNOSIS — D3A090 Benign carcinoid tumor of the bronchus and lung: Secondary | ICD-10-CM

## 2019-09-24 NOTE — Progress Notes (Signed)
[address]       [place] [address]             [phone]                  [name] [place] Medical Record #[id number] Date of Birth: 29-May-1951  Referring XB:JYNWGN, Elta Guadeloupe, MD Primary Cardiology: Primary Care:Perini, Elta Guadeloupe, MD  Chief Complaint:  Follow Up Visit OPERATIVE REPORT DATE OF PROCEDURE:  01/20/2019 PREOPERATIVE DIAGNOSIS:  Right chest mass. POSTOPERATIVE DIAGNOSIS:  Right chest mass. PROCEDURE PERFORMED:  Bronchoscopy, right video-assisted thoracoscopy with resection of interlobar lymph node with intercostal nerve block.  Diagnosis 1. Lung, biopsy, Right - TYPICAL CARCINOID TUMOR. 2. Lung, biopsy, Right - TYPICAL CARCINOID TUMOR. Microscopic Comment 1. -2. Cells of interest are positive for CKAE1AE3, CK7, TTF-1, Synaptophysin, Chromogranin and CD56. CK20, p63, CK5/6 and CD45 are negative. Ki-67 labeling index is less than 3%. 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 Danny Manners MD  History of Present Illness:     Patient returns to the office today in follow-up visit after  resection of right intralobar mass-which on final path was noted to be a typical carcinoid.    Patient denies any respiratory complaints .  He does note that he had recently had right knee replacement and was recovering from this   Zubrod Score: At the time of surgery this patient's most appropriate activity status/level should be described as: '[x]'$     0    Normal activity, no symptoms '[]'$     1    Restricted in physical strenuous activity but ambulatory, able to do out light work '[]'$     2    Ambulatory and capable of self care, unable to do work activities, up and about                 >50 % of waking hours                                                                                   '[]'$     3    Only limited self care, in bed greater than 50% of waking hours '[]'$     4    Completely disabled, no self care, confined to bed or  chair '[]'$     5    Moribund  Social History   Tobacco Use  Smoking Status Never Smoker  Smokeless Tobacco Never Used       Allergies  Allergen Reactions  . Codeine Other (See Comments)    headache    Current Outpatient Medications  Medication Sig Dispense Refill  . ibuprofen (ADVIL) 200 MG tablet Take 200 mg by mouth every 8 (eight) hours as needed. Takes 2- 3 tabs    . Milk Thistle 1000 MG CAPS Take 1,000 mg by mouth daily.     . rosuvastatin (CRESTOR) 10 MG tablet Take 10 mg by mouth daily in the afternoon.      No current facility-administered medications for this visit.       Physical Exam: BP (!) 158/91 (BP Location: Right Arm)   Pulse 70   Temp 97.7 F (36.5  C) (Temporal)   Resp 20   Ht '5\' 10"'$  (1.778 m)   Wt 208 lb (94.3 kg)   SpO2 98% Comment: RA  BMI 29.84 kg/m  General appearance: alert, cooperative and no distress Neck: no adenopathy, no carotid bruit, no JVD, supple, symmetrical, trachea midline and thyroid not enlarged, symmetric, no tenderness/mass/nodules Lymph nodes: Cervical, supraclavicular, and axillary nodes normal. Resp: clear to auscultation bilaterally Cardio: regular rate and rhythm, S1, S2 normal, no murmur, click, rub or gallop GI: soft, non-tender; bowel sounds normal; no masses,  no organomegaly Extremities: extremities normal, atraumatic, no cyanosis or edema and Homans sign is negative, no sign of DVT Neurologic: Grossly normal Recent right knee surgery  Diagnostic Studies & Laboratory data:         Recent Radiology Findings: CT CHEST WO CONTRAST  Result Date: 09/24/2019 CLINICAL DATA:  Mediastinal mass. Status post resection. Carcinoid tumor of the lung. EXAM: CT CHEST WITHOUT CONTRAST TECHNIQUE: Multidetector CT imaging of the chest was performed following the standard protocol without IV contrast. COMPARISON:  12/25/18 FINDINGS: Cardiovascular: The heart size appears within normal limits. No pericardial effusion. Mild aortic  atherosclerosis. Lad, left circumflex and RCA coronary artery calcifications. Mediastinum/Nodes: No enlarged mediastinal or axillary lymph nodes. Thyroid gland, trachea, and esophagus demonstrate no significant findings. Lungs/Pleura: No pleural effusion. No airspace consolidation, atelectasis or pneumothorax. Status post wedge resection of previous right lower lobe lung nodule. No complications. No specific findings to suggest recurrent tumor. Mild pleuroparenchymal scarring and ground-glass attenuation within the periphery of the right midlung is likely postoperative. Upper Abdomen: No acute abnormality. Musculoskeletal: No chest wall mass or suspicious bone lesions identified. IMPRESSION: 1. Status post wedge resection of right lower lobe lung nodule. No specific findings identified to suggest recurrent tumor or metastatic disease. 2. Multi vessel coronary artery calcifications. Aortic Atherosclerosis (ICD10-I70.0). Electronically Signed   By: Kerby Moors M.D.   On: 09/24/2019 11:00      I have independently reviewed the above radiology findings and reviewed findings  with the patient.  Recent Labs: Lab Results  Component Value Date   WBC 7.8 07/31/2019   HGB 11.7 (L) 07/31/2019   HCT 35.1 (L) 07/31/2019   PLT 179 07/31/2019   GLUCOSE 160 (H) 07/31/2019   ALT 15 01/22/2019   AST 18 01/22/2019   NA 139 07/31/2019   K 4.0 07/31/2019   CL 102 07/31/2019   CREATININE 0.83 07/31/2019   BUN 16 07/31/2019   CO2 26 07/31/2019   INR 1.0 01/19/2019      Assessment / Plan:   Status post resection interlobar carcinoid tumor-resected July 2020-CT scan shows no evidence of recurrence, will plan follow-up CT scan in 1 year     [name] 09/24/2019 11:11 AM

## 2019-09-29 ENCOUNTER — Encounter: Payer: Self-pay | Admitting: Physical Therapy

## 2019-09-29 ENCOUNTER — Ambulatory Visit: Payer: Medicare Other | Attending: Orthopaedic Surgery | Admitting: Physical Therapy

## 2019-09-29 ENCOUNTER — Other Ambulatory Visit: Payer: Self-pay

## 2019-09-29 DIAGNOSIS — M25661 Stiffness of right knee, not elsewhere classified: Secondary | ICD-10-CM

## 2019-09-29 DIAGNOSIS — M25561 Pain in right knee: Secondary | ICD-10-CM | POA: Insufficient documentation

## 2019-09-29 DIAGNOSIS — R2689 Other abnormalities of gait and mobility: Secondary | ICD-10-CM | POA: Insufficient documentation

## 2019-09-29 DIAGNOSIS — R600 Localized edema: Secondary | ICD-10-CM | POA: Insufficient documentation

## 2019-09-29 NOTE — Therapy (Signed)
[facility] [address] [place], [place], [address] Phone: [phone]   Fax:  [phone]  Physical Therapy Treatment  Patient Details  Name: [name] MRN: [id number] Date of Birth: December 11, 1950 Referring Provider (PT): Jean Rosenthal   Encounter Date: 09/29/2019   Progress Note Reporting Period 08/27/19 to 09/29/19  See note below for Objective Data and Assessment of Progress/Goals.       PT End of Session - 09/29/19 1019    Visit Number  10    Date for PT Re-Evaluation  10/22/19    Authorization Type  UHC Medicare    PT Start Time  1018    PT Stop Time  1111    PT Time Calculation (min)  53 min    Activity Tolerance  Patient tolerated treatment well    Behavior During Therapy  WFL for tasks assessed/performed       Past Medical History:  Diagnosis Date  . Arthritis    knee, no meds  . Cancer [facility]) 2020   carcinoid tumor of right lung  . 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 Eczema 08/08/2010  . Family history of adverse reaction to anesthesia    mom- post op N/V  . GERD (gastroesophageal reflux disease)    weight loss resolved gerd, no medication  . Hyperlipidemia   . PONV (postoperative nausea and vomiting)   . Sleep apnea    uses CPAP nightly  . Unilateral primary osteoarthritis, right knee 07/30/2019    Past Surgical History:  Procedure Laterality Date  . COLONOSCOPY     hx polyps  . EYE SURGERY     lasik  . INTERCOSTAL NERVE BLOCK  01/20/2019   Procedure: Intercostal Nerve Block;  Surgeon: Grace Isaac, MD;  Location: [place];  Service: Thoracic;;  . KNEE ARTHROSCOPY Right   . SEPTOPLASTY  1980   x 2  . TONSILLECTOMY AND ADENOIDECTOMY  1960  . TOTAL KNEE ARTHROPLASTY Right 07/30/2019  . TOTAL KNEE ARTHROPLASTY Right 07/30/2019   Procedure: RIGHT TOTAL KNEE ARTHROPLASTY;  Surgeon: Mcarthur Rossetti, MD;  Location: [place];  Service: Orthopedics;  Laterality: Right;  Marland Kitchen  VIDEO ASSISTED THORACOSCOPY (VATS)/WEDGE RESECTION Right 01/20/2019   Procedure: VIDEO ASSISTED THORACOSCOPY (VATS) WITH RESECTION OF RIGHT INTRAPULMONARY LYMPH NODE;  Surgeon: Grace Isaac, MD;  Location: [place];  Service: Thoracic;  Laterality: Right;  Marland Kitchen VIDEO BRONCHOSCOPY N/A 01/20/2019   Procedure: VIDEO BRONCHOSCOPY;  Surgeon: Grace Isaac, MD;  Location: [facility] OR;  Service: Thoracic;  Laterality: N/A;    There were no vitals filed for this visit.  Subjective Assessment - 09/29/19 1057    Subjective  I am doing well.  No complaints today.    Patient Stated Goals  Want to be able to cut toenails on his right foot    Currently in Pain?  No/denies         [facility] PT Assessment - 09/29/19 0001      Observation/Other Assessments   Focus on Therapeutic Outcomes (FOTO)   18% limited (goal was 23%)      AROM   AROM Assessment Site  Knee    Right/Left Knee  Right    Right Knee Extension  -3    Right Knee Flexion  113      PROM   PROM Assessment Site  Knee    Right/Left Knee  Right    Right Knee Extension  0    Right Knee Flexion  [facility] Adult PT Treatment/Exercise - 09/29/19 0001      Knee/Hip Exercises: Stretches   Hip Flexor Stretch  Right;2 reps;60 seconds    Hip Flexor Stretch Limitations  standing with right knee on chair    ITB Stretch  Right;2 reps;60 seconds    ITB Stretch Limitations  SDLY with leg of EOB      Knee/Hip Exercises: Aerobic   Stationary Bike  Bike level 5-7 x 7 min moving fwd for ROM      Knee/Hip Exercises: Machines for Strengthening   Cybex Knee Extension  25# x 20, 15# eccentric lowering with right (both up)    Cybex Knee Flexion  20# right only x 20    Cybex Leg Press  seat 5: 90# x 20 with prolonged hold into ext bil; unilateral just right 55# 2x10   hold on ext with single leg     Knee/Hip Exercises: Standing   Step Down  Right;20 reps;Hand Hold: 2;Step Height: 6"    Step Down Limitations  heel taps  with cues to bend knee rather than drop hip      Vasopneumatic   Number Minutes Vasopneumatic   15 minutes    Vasopnuematic Location   Knee    Vasopneumatic Pressure  Medium    Vasopneumatic Temperature   34               PT Short Term Goals - 09/29/19 1023      PT SHORT TERM GOAL #2   Title  Pt able to climb stairs with a reciprocal gait pattern    Baseline  reciprocal with ascending, step-to with descending; reports 60% improvement with descending    Status  On-going        PT Long Term Goals - 09/29/19 1024      PT LONG TERM GOAL #1   Title  Pt to demo right knee flex 0-125 to normalize gait and ADLs    Baseline  3-113 deg    Status  On-going      PT LONG TERM GOAL #2   Title  Patient able to perform ADLs without increased pain in the right knee.    Status  Achieved      PT LONG TERM GOAL #3   Title  Pt to demo 5/5 RLE strength    Status  Achieved      PT LONG TERM GOAL #4   Title  Patient able to sleep 6 hours without waking from knee pain    Baseline  slept throught the night last night    Status  Partially Met      PT LONG TERM GOAL #5   Title  Patient able to cut his toenails without difficulty    Baseline  able to, but difficult and some pain in lateral knee    Status  On-going            Plan - 09/29/19 1101    Clinical Impression Statement  Patient is progressing well toward LTGs and has met his FOTO goal. He is limited mainly with full extension which is likely affected by tight right ITB and hip flexors as well as remaining edema. New stretches were performed and issued for HEP. He is still limited descending stairs due to eccentric quad weakness and tightness in the knee. He will benefit from continued PT to meet his unmet  LTGs.    PT Frequency  2x / week    PT Duration  8 weeks    PT Treatment/Interventions  ADLs/Self Care Home Management;Electrical Stimulation;Cryotherapy;Gait training;Stair training;Therapeutic exercise;Balance  training;Neuromuscular re-education;Patient/family education;Manual techniques;Passive range of motion;Taping;Vasopneumatic Device;Joint Manipulations    PT Next Visit Plan  continue flexibility for ITB and hip flexors; eccentric Rt knee strength,  knee ROM/strength; vaso; gait/balance.    PT Home Exercise Plan  [id number]    Consulted and Agree with Plan of Care  Patient       Patient will benefit from skilled therapeutic intervention in order to improve the following deficits and impairments:  Abnormal gait, Decreased range of motion, Pain, Increased edema, Decreased strength, Impaired flexibility  Visit Diagnosis: Stiffness of right knee, not elsewhere classified  Acute pain of right knee  Localized edema  Other abnormalities of gait and mobility     Problem List Patient Active Problem List   Diagnosis Date Noted  . Unilateral primary osteoarthritis, right knee 07/30/2019  . 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 COLONIC POLYPS, ADENOMATOUS 05/24/2008  . DIVERTICULOSIS, COLON 05/24/2008    Madelyn Flavors PT 09/29/2019, 2:27 PM   [facility] [address], [place], [place], [address] Phone: [phone]   Fax:  [phone]  Name: [name] MRN: [id number] Date of Birth: November 27, 1950

## 2019-10-01 ENCOUNTER — Ambulatory Visit: Payer: Medicare Other

## 2019-10-01 ENCOUNTER — Other Ambulatory Visit: Payer: Self-pay

## 2019-10-01 DIAGNOSIS — M25661 Stiffness of right knee, not elsewhere classified: Secondary | ICD-10-CM

## 2019-10-01 DIAGNOSIS — M25561 Pain in right knee: Secondary | ICD-10-CM

## 2019-10-01 DIAGNOSIS — R2689 Other abnormalities of gait and mobility: Secondary | ICD-10-CM

## 2019-10-01 DIAGNOSIS — R600 Localized edema: Secondary | ICD-10-CM

## 2019-10-01 NOTE — Therapy (Signed)
[facility] [address] [place], [place], [address] Phone: [phone]   Fax:  [phone]  Physical Therapy Treatment  Patient Details  Name: [name] MRN: [id number] Date of Birth: 31-Dec-1950 Referring Provider (PT): Jean Rosenthal   Encounter Date: 10/01/2019  PT End of Session - 10/01/19 1025    Visit Number  11    Date for PT Re-Evaluation  10/22/19    Authorization Type  [employer]HC Medicare    Progress Note Due on Visit  20    PT Start Time  0931    PT Stop Time  1027    PT Time Calculation (min)  56 min    Activity Tolerance  Patient tolerated treatment well    Behavior During Therapy  [facility] for tasks assessed/performed       Past Medical History:  Diagnosis Date  . Arthritis    knee, no meds  . Cancer [facility]) 2020   carcinoid tumor of right lung  . 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 Eczema 08/08/2010  . Family history of adverse reaction to anesthesia    mom- post op N/V  . GERD (gastroesophageal reflux disease)    weight loss resolved gerd, no medication  . Hyperlipidemia   . PONV (postoperative nausea and vomiting)   . Sleep apnea    uses CPAP nightly  . Unilateral primary osteoarthritis, right knee 07/30/2019    Past Surgical History:  Procedure Laterality Date  . COLONOSCOPY     hx polyps  . EYE SURGERY     lasik  . INTERCOSTAL NERVE BLOCK  01/20/2019   Procedure: Intercostal Nerve Block;  Surgeon: Grace Isaac, MD;  Location: [place];  Service: Thoracic;;  . KNEE ARTHROSCOPY Right   . SEPTOPLASTY  1980   x 2  . TONSILLECTOMY AND ADENOIDECTOMY  1960  . TOTAL KNEE ARTHROPLASTY Right 07/30/2019  . TOTAL KNEE ARTHROPLASTY Right 07/30/2019   Procedure: RIGHT TOTAL KNEE ARTHROPLASTY;  Surgeon: Mcarthur Rossetti, MD;  Location: [place];  Service: Orthopedics;  Laterality: Right;  Marland Kitchen VIDEO ASSISTED THORACOSCOPY (VATS)/WEDGE RESECTION Right 01/20/2019   Procedure: VIDEO ASSISTED  THORACOSCOPY (VATS) WITH RESECTION OF RIGHT INTRAPULMONARY LYMPH NODE;  Surgeon: Grace Isaac, MD;  Location: [place];  Service: Thoracic;  Laterality: Right;  Marland Kitchen VIDEO BRONCHOSCOPY N/A 01/20/2019   Procedure: VIDEO BRONCHOSCOPY;  Surgeon: Grace Isaac, MD;  Location: [facility] OR;  Service: Thoracic;  Laterality: N/A;    There were no vitals filed for this visit.  Subjective Assessment - 10/01/19 0939    Subjective  I am doing really well.    Currently in Pain?  No/denies                       [facility] Adult PT Treatment/Exercise - 10/01/19 0001      Knee/Hip Exercises: Stretches   Hip Flexor Stretch  Right;2 reps;60 seconds    Hip Flexor Stretch Limitations  supine on mat table    ITB Stretch  Right;2 reps;60 seconds    ITB Stretch Limitations  SDLY with leg of EOB      Knee/Hip Exercises: Aerobic   Stationary Bike  Bike level 5-7 x 7 min moving fwd for ROM      Knee/Hip Exercises: Machines for Strengthening   Cybex Knee Extension  25# x 20, 15# eccentric lowering with right (both up)    Cybex Knee Flexion  20# right only x 20  Cybex Leg Press  seat 5: 90# x 20 with prolonged hold into ext bil; unilateral just right 55# 2x10   hold on ext with single leg     Vasopneumatic   Number Minutes Vasopneumatic   15 minutes    Vasopnuematic Location   Knee    Vasopneumatic Pressure  Medium    Vasopneumatic Temperature   34      Manual Therapy   Manual Therapy  Soft tissue mobilization;Myofascial release    Manual therapy comments  elongation to distal hamstrings and quads and proximal gastroc, patellar mobs and scar massage               PT Short Term Goals - 09/29/19 1023      PT SHORT TERM GOAL #2   Title  Pt able to climb stairs with a reciprocal gait pattern    Baseline  reciprocal with ascending, step-to with descending; reports 60% improvement with descending    Status  On-going        PT Long Term Goals - 09/29/19 1024      PT LONG TERM  GOAL #1   Title  Pt to demo right knee flex 0-125 to normalize gait and ADLs    Baseline  3-113 deg    Status  On-going      PT LONG TERM GOAL #2   Title  Patient able to perform ADLs without increased pain in the right knee.    Status  Achieved      PT LONG TERM GOAL #3   Title  Pt to demo 5/5 RLE strength    Status  Achieved      PT LONG TERM GOAL #4   Title  Patient able to sleep 6 hours without waking from knee pain    Baseline  slept throught the night last night    Status  Partially Met      PT LONG TERM GOAL #5   Title  Patient able to cut his toenails without difficulty    Baseline  able to, but difficult and some pain in lateral knee    Status  On-going            Plan - 10/01/19 0947    Clinical Impression Statement  Patient is progressing well toward LTGs and has met his FOTO goal this week.  He is limited mainly with full A/ROM extension which is likely affected by tight right ITB and hip flexors as well as remaining edema. Pt is working to stretch at home. He is still limited descending stairs due to eccentric quad weakness and tightness in the knee. Pt will continue to benefit from skilled PT to address Lt knee strength and flexibility s/p TKA.    PT Frequency  2x / week    PT Duration  8 weeks    PT Treatment/Interventions  ADLs/Self Care Home Management;Electrical Stimulation;Cryotherapy;Gait training;Stair training;Therapeutic exercise;Balance training;Neuromuscular re-education;Patient/family education;Manual techniques;Passive range of motion;Taping;Vasopneumatic Device;Joint Manipulations    PT Next Visit Plan  continue flexibility for ITB and hip flexors; eccentric Rt knee strength,  knee ROM/strength; vaso; gait/balance.    PT Home Exercise Plan  [id number]    Consulted and Agree with Plan of Care  Patient       Patient will benefit from skilled therapeutic intervention in order to improve the following deficits and impairments:  Abnormal gait, Decreased  range of motion, Pain, Increased edema, Decreased strength, Impaired flexibility  Visit Diagnosis: Stiffness of right knee, not elsewhere classified  Acute pain of right knee  Localized edema  Other abnormalities of gait and mobility     Problem List Patient Active Problem List   Diagnosis Date Noted  . Unilateral primary osteoarthritis, right knee 07/30/2019  . 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 COLONIC POLYPS, ADENOMATOUS 05/24/2008  . DIVERTICULOSIS, COLON 05/24/2008   Sigurd Sos, PT 10/01/19 10:37 AM  [facility] [address] [place], [place], [address] Phone: [phone]   Fax:  [phone]  Name: [name] MRN: [id number] Date of Birth: 06/14/1951

## 2019-10-06 ENCOUNTER — Ambulatory Visit: Payer: Medicare Other

## 2019-10-06 ENCOUNTER — Other Ambulatory Visit: Payer: Self-pay

## 2019-10-06 DIAGNOSIS — M25661 Stiffness of right knee, not elsewhere classified: Secondary | ICD-10-CM

## 2019-10-06 DIAGNOSIS — R600 Localized edema: Secondary | ICD-10-CM

## 2019-10-06 DIAGNOSIS — M25561 Pain in right knee: Secondary | ICD-10-CM

## 2019-10-06 DIAGNOSIS — R2689 Other abnormalities of gait and mobility: Secondary | ICD-10-CM

## 2019-10-06 NOTE — Therapy (Signed)
[facility] [address], [place], [place], [address] Phone: [phone]   Fax:  [phone]  Physical Therapy Treatment  Patient Details  Name: [name] MRN: [id number] Date of Birth: [birth year] Referring Provider (PT): Jean Rosenthal   Encounter Date: 10/06/2019  PT End of Session - 10/06/19 0934    Visit Number  12    Date for PT Re-Evaluation  10/22/19    Authorization Type  UHC Medicare    Progress Note Due on Visit  20    PT Start Time  0847    PT Stop Time  0948    PT Time Calculation (min)  61 min    Activity Tolerance  Patient tolerated treatment well    Behavior During Therapy  [facility] for tasks assessed/performed       Past Medical History:  Diagnosis Date  . Arthritis    knee, no meds  . Cancer [facility]) 2020   carcinoid tumor of right lung  . 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 Eczema 08/08/2010  . Family history of adverse reaction to anesthesia    mom- post op N/V  . GERD (gastroesophageal reflux disease)    weight loss resolved gerd, no medication  . Hyperlipidemia   . PONV (postoperative nausea and vomiting)   . Sleep apnea    uses CPAP nightly  . Unilateral primary osteoarthritis, right knee 07/30/2019    Past Surgical History:  Procedure Laterality Date  . COLONOSCOPY     hx polyps  . EYE SURGERY     lasik  . INTERCOSTAL NERVE BLOCK  01/20/2019   Procedure: Intercostal Nerve Block;  Surgeon: Grace Isaac, MD;  Location: [place];  Service: Thoracic;;  . KNEE ARTHROSCOPY Right   . SEPTOPLASTY  1980   x 2  . TONSILLECTOMY AND ADENOIDECTOMY  1960  . TOTAL KNEE ARTHROPLASTY Right 07/30/2019  . TOTAL KNEE ARTHROPLASTY Right 07/30/2019   Procedure: RIGHT TOTAL KNEE ARTHROPLASTY;  Surgeon: Mcarthur Rossetti, MD;  Location: [place];  Service: Orthopedics;  Laterality: Right;  Marland Kitchen VIDEO ASSISTED THORACOSCOPY (VATS)/WEDGE RESECTION Right 01/20/2019   Procedure: VIDEO ASSISTED  THORACOSCOPY (VATS) WITH RESECTION OF RIGHT INTRAPULMONARY LYMPH NODE;  Surgeon: Grace Isaac, MD;  Location: [place];  Service: Thoracic;  Laterality: Right;  Marland Kitchen VIDEO BRONCHOSCOPY N/A 01/20/2019   Procedure: VIDEO BRONCHOSCOPY;  Surgeon: Grace Isaac, MD;  Location: [facility] OR;  Service: Thoracic;  Laterality: N/A;    There were no vitals filed for this visit.  Subjective Assessment - 10/06/19 0856    Subjective  I played golf over the weekend.    Currently in Pain?  No/denies                       [facility] Adult PT Treatment/Exercise - 10/06/19 0001      Knee/Hip Exercises: Stretches   Hip Flexor Stretch  Right;2 reps;60 seconds    Hip Flexor Stretch Limitations  supine on mat table      Knee/Hip Exercises: Aerobic   Stationary Bike  Bike level 5-7 x 8 min moving fwd for ROM      Knee/Hip Exercises: Machines for Strengthening   Cybex Knee Flexion  35# right only x 20    Cybex Leg Press  seat 5: 100# x 20 with prolonged hold into ext bil; unilateral just right 70# 2x10   hold on ext with single leg     Knee/Hip Exercises: Standing  Walking with Sports Cord  30# forward and reverse x 10 each, sidestepping 20# x 10 each      Vasopneumatic   Number Minutes Vasopneumatic   15 minutes    Vasopnuematic Location   Knee    Vasopneumatic Pressure  Medium    Vasopneumatic Temperature   34      Manual Therapy   Manual Therapy  Soft tissue mobilization;Myofascial release    Manual therapy comments  elongation to distal hamstrings and quads and proximal gastroc, patellar mobs and scar massage               PT Short Term Goals - 10/06/19 0857      PT SHORT TERM GOAL #2   Title  Pt able to climb stairs with a reciprocal gait pattern    Baseline  reciprocal with ascending, step-to with descending; reports 80% improvement with descending    Period  Weeks    Status  On-going        PT Long Term Goals - 09/29/19 1024      PT LONG TERM GOAL #1   Title  Pt  to demo right knee flex 0-125 to normalize gait and ADLs    Baseline  3-113 deg    Status  On-going      PT LONG TERM GOAL #2   Title  Patient able to perform ADLs without increased pain in the right knee.    Status  Achieved      PT LONG TERM GOAL #3   Title  Pt to demo 5/5 RLE strength    Status  Achieved      PT LONG TERM GOAL #4   Title  Patient able to sleep 6 hours without waking from knee pain    Baseline  slept throught the night last night    Status  Partially Met      PT LONG TERM GOAL #5   Title  Patient able to cut his toenails without difficulty    Baseline  able to, but difficult and some pain in lateral knee    Status  On-going            Plan - 10/06/19 0910    Clinical Impression Statement  Pt is making steady gains s/p TKA.  Pt was able to golf over the weekend without limitation.   Pt advanced to increased weight on the leg press.   Pt is working to stretch at home. He is still limited with descending stairs due to eccentric quad weakness and tightness in the knee and is able to descend 80% with step over step gait.  Pt will continue to benefit from skilled PT to address Lt knee strength and flexibility s/p TKA.    PT Frequency  2x / week    PT Duration  8 weeks    PT Treatment/Interventions  ADLs/Self Care Home Management;Electrical Stimulation;Cryotherapy;Gait training;Stair training;Therapeutic exercise;Balance training;Neuromuscular re-education;Patient/family education;Manual techniques;Passive range of motion;Taping;Vasopneumatic Device;Joint Manipulations    PT Next Visit Plan  manual for edema and tissue mobility, knee strength and stability    PT Home Exercise Plan  [id number]    Consulted and Agree with Plan of Care  Patient       Patient will benefit from skilled therapeutic intervention in order to improve the following deficits and impairments:  Abnormal gait, Decreased range of motion, Pain, Increased edema, Decreased strength, Impaired  flexibility  Visit Diagnosis: Stiffness of right knee, not elsewhere classified  Acute pain of right   knee  Localized edema  Other abnormalities of gait and mobility     Problem List Patient Active Problem List   Diagnosis Date Noted  . Unilateral primary osteoarthritis, right knee 07/30/2019  . 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 COLONIC POLYPS, ADENOMATOUS 05/24/2008  . DIVERTICULOSIS, COLON 05/24/2008     Sigurd Sos, PT 10/06/19 9:37 AM  [facility] [address] [place], [place], [address] Phone: [phone]   Fax:  [phone]  Name: [name] MRN: [id number] Date of Birth: September 29, 1950

## 2019-10-08 ENCOUNTER — Other Ambulatory Visit: Payer: Self-pay

## 2019-10-08 ENCOUNTER — Ambulatory Visit: Payer: Medicare Other

## 2019-10-08 DIAGNOSIS — R2689 Other abnormalities of gait and mobility: Secondary | ICD-10-CM

## 2019-10-08 DIAGNOSIS — M25661 Stiffness of right knee, not elsewhere classified: Secondary | ICD-10-CM

## 2019-10-08 DIAGNOSIS — M25561 Pain in right knee: Secondary | ICD-10-CM

## 2019-10-08 DIAGNOSIS — R600 Localized edema: Secondary | ICD-10-CM

## 2019-10-08 NOTE — Therapy (Addendum)
[facility] [address], [place], [place], [address] Phone: [phone]   Fax:  [phone]  Physical Therapy Treatment  Patient Details  Name: [name] MRN: [id number] Date of Birth: 1950/10/09 Referring Provider (PT): Jean Rosenthal   Encounter Date: 10/08/2019  PT End of Session - 10/08/19 1153    Visit Number  13    Date for PT Re-Evaluation  10/22/19    Authorization Type  UHC Medicare    Progress Note Due on Visit  20    PT Start Time  1106    PT Stop Time  1205    PT Time Calculation (min)  59 min    Activity Tolerance  Patient tolerated treatment well    Behavior During Therapy  [facility] for tasks assessed/performed       Past Medical History:  Diagnosis Date  . Arthritis    knee, no meds  . Cancer [facility]) 2020   carcinoid tumor of right lung  . 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 Eczema 08/08/2010  . Family history of adverse reaction to anesthesia    mom- post op N/V  . GERD (gastroesophageal reflux disease)    weight loss resolved gerd, no medication  . Hyperlipidemia   . PONV (postoperative nausea and vomiting)   . Sleep apnea    uses CPAP nightly  . Unilateral primary osteoarthritis, right knee 07/30/2019    Past Surgical History:  Procedure Laterality Date  . COLONOSCOPY     hx polyps  . EYE SURGERY     lasik  . INTERCOSTAL NERVE BLOCK  01/20/2019   Procedure: Intercostal Nerve Block;  Surgeon: Grace Isaac, MD;  Location: [place];  Service: Thoracic;;  . KNEE ARTHROSCOPY Right   . SEPTOPLASTY  1980   x 2  . TONSILLECTOMY AND ADENOIDECTOMY  1960  . TOTAL KNEE ARTHROPLASTY Right 07/30/2019  . TOTAL KNEE ARTHROPLASTY Right 07/30/2019   Procedure: RIGHT TOTAL KNEE ARTHROPLASTY;  Surgeon: Mcarthur Rossetti, MD;  Location: [place];  Service: Orthopedics;  Laterality: Right;  Marland Kitchen VIDEO ASSISTED THORACOSCOPY (VATS)/WEDGE RESECTION Right 01/20/2019   Procedure: VIDEO ASSISTED  THORACOSCOPY (VATS) WITH RESECTION OF RIGHT INTRAPULMONARY LYMPH NODE;  Surgeon: Grace Isaac, MD;  Location: [place];  Service: Thoracic;  Laterality: Right;  Marland Kitchen VIDEO BRONCHOSCOPY N/A 01/20/2019   Procedure: VIDEO BRONCHOSCOPY;  Surgeon: Grace Isaac, MD;  Location: [facility] OR;  Service: Thoracic;  Laterality: N/A;    There were no vitals filed for this visit.  Subjective Assessment - 10/08/19 1108    Subjective  I didn't have any increased pain after last session.  I am going to play golf this weekend at the beach.    Currently in Pain?  No/denies         [facility] PT Assessment - 10/08/19 0001      AROM   Right Knee Extension  5    Right Knee Flexion  110                   [facility] Adult PT Treatment/Exercise - 10/08/19 0001      Knee/Hip Exercises: Stretches   Hip Flexor Stretch  Right;2 reps;60 seconds    Hip Flexor Stretch Limitations  supine on mat table      Knee/Hip Exercises: Aerobic   Nustep  Level 8x 6 minutes    PT present to discuss progress     Knee/Hip Exercises: Machines for Strengthening   Cybex  Knee Extension  30# bil extension 2x10, up with both and eccentric lowering 15# 2x10    Cybex Knee Flexion  45# bil 2x10    Cybex Leg Press  seat 5: 100# x 20 with prolonged hold into ext bil; unilateral just right 70# 2x10   hold on ext with single leg     Knee/Hip Exercises: Standing   Forward Step Up  Right;2 sets;10 reps   using Bosu ball   SLS  on blue pod 3x 20 seconds    Walking with Sports Cord  35# forward and reverse x 10 each, sidestepping 35 # x 10 each      Game Ready: Vasopneumatic compression: 3 snowflakes, med compression to Rt knee x 15 minutes post session.           PT Short Term Goals - 10/06/19 0857      PT SHORT TERM GOAL #2   Title  Pt able to climb stairs with a reciprocal gait pattern    Baseline  reciprocal with ascending, step-to with descending; reports 80% improvement with descending    Period  Weeks    Status   On-going        PT Long Term Goals - 09/29/19 1024      PT LONG TERM GOAL #1   Title  Pt to demo right knee flex 0-125 to normalize gait and ADLs    Baseline  3-113 deg    Status  On-going      PT LONG TERM GOAL #2   Title  Patient able to perform ADLs without increased pain in the right knee.    Status  Achieved      PT LONG TERM GOAL #3   Title  Pt to demo 5/5 RLE strength    Status  Achieved      PT LONG TERM GOAL #4   Title  Patient able to sleep 6 hours without waking from knee pain    Baseline  slept throught the night last night    Status  Partially Met      PT LONG TERM GOAL #5   Title  Patient able to cut his toenails without difficulty    Baseline  able to, but difficult and some pain in lateral knee    Status  On-going            Plan - 10/08/19 1116    Clinical Impression Statement  Pt is making steady gains s/p TKA.  Pt was able to golf over the weekend without increased pain and plans to golf again this weekend.   Pt advanced to increased weight on the leg press.   Pt demonstrates mild instability and moderate glute med activation with single leg stance on balance pad.  Pt did well with increased resistance with weights and resisted walking today. Pt requires minor verbal cues for technique with exercise.  Pt will continue to benefit from skilled PT to address Lt knee strength and flexibility s/p TKA.    PT Frequency  2x / week    PT Duration  8 weeks    PT Treatment/Interventions  ADLs/Self Care Home Management;Electrical Stimulation;Cryotherapy;Gait training;Stair training;Therapeutic exercise;Balance training;Neuromuscular re-education;Patient/family education;Manual techniques;Passive range of motion;Taping;Vasopneumatic Device;Joint Manipulations    PT Next Visit Plan  manual for edema and tissue mobility, knee strength and stability    PT Home Exercise Plan  [id number]    Consulted and Agree with Plan of Care  Patient       Patient will  benefit from  skilled therapeutic intervention in order to improve the following deficits and impairments:  Abnormal gait, Decreased range of motion, Pain, Increased edema, Decreased strength, Impaired flexibility  Visit Diagnosis: Stiffness of right knee, not elsewhere classified  Acute pain of right knee  Localized edema  Other abnormalities of gait and mobility     Problem List Patient Active Problem List   Diagnosis Date Noted  . Unilateral primary osteoarthritis, right knee 07/30/2019  . 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 COLONIC POLYPS, ADENOMATOUS 05/24/2008  . DIVERTICULOSIS, COLON 05/24/2008    Sigurd Sos, PT 10/08/19 11:57 AM  [facility] [address], [place], [place], [address] Phone: [phone]   Fax:  [phone]  Name: [name] MRN: [id number] Date of Birth: [birth year]

## 2019-10-13 ENCOUNTER — Other Ambulatory Visit: Payer: Self-pay

## 2019-10-13 ENCOUNTER — Encounter: Payer: Self-pay | Admitting: Physical Therapy

## 2019-10-13 ENCOUNTER — Ambulatory Visit: Payer: Medicare Other | Admitting: Physical Therapy

## 2019-10-13 DIAGNOSIS — M25661 Stiffness of right knee, not elsewhere classified: Secondary | ICD-10-CM | POA: Diagnosis not present

## 2019-10-13 DIAGNOSIS — R600 Localized edema: Secondary | ICD-10-CM

## 2019-10-13 DIAGNOSIS — R2689 Other abnormalities of gait and mobility: Secondary | ICD-10-CM

## 2019-10-13 NOTE — Therapy (Signed)
[facility] [address], [place], [place], [address] Phone: [phone]   Fax:  [phone]  Physical Therapy Treatment  Patient Details  Name: [name] MRN: [id number] Date of Birth: January 02, 1951 Referring Provider (PT): Jean Rosenthal   Encounter Date: 10/13/2019  PT End of Session - 10/13/19 0851    Visit Number  14    Date for PT Re-Evaluation  10/22/19    Authorization Type  [employer]HC Medicare    PT Start Time  0850    PT Stop Time  0943    PT Time Calculation (min)  53 min    Activity Tolerance  Patient tolerated treatment well    Behavior During Therapy  [facility] for tasks assessed/performed       Past Medical History:  Diagnosis Date  . Arthritis    knee, no meds  . Cancer [facility]) 2020   carcinoid tumor of right lung  . 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 Eczema 08/08/2010  . Family history of adverse reaction to anesthesia    mom- post op N/V  . GERD (gastroesophageal reflux disease)    weight loss resolved gerd, no medication  . Hyperlipidemia   . PONV (postoperative nausea and vomiting)   . Sleep apnea    uses CPAP nightly  . Unilateral primary osteoarthritis, right knee 07/30/2019    Past Surgical History:  Procedure Laterality Date  . COLONOSCOPY     hx polyps  . EYE SURGERY     lasik  . INTERCOSTAL NERVE BLOCK  01/20/2019   Procedure: Intercostal Nerve Block;  Surgeon: Grace Isaac, MD;  Location: [place];  Service: Thoracic;;  . KNEE ARTHROSCOPY Right   . SEPTOPLASTY  1980   x 2  . TONSILLECTOMY AND ADENOIDECTOMY  1960  . TOTAL KNEE ARTHROPLASTY Right 07/30/2019  . TOTAL KNEE ARTHROPLASTY Right 07/30/2019   Procedure: RIGHT TOTAL KNEE ARTHROPLASTY;  Surgeon: Mcarthur Rossetti, MD;  Location: [place];  Service: Orthopedics;  Laterality: Right;  Marland Kitchen VIDEO ASSISTED THORACOSCOPY (VATS)/WEDGE RESECTION Right 01/20/2019   Procedure: VIDEO ASSISTED THORACOSCOPY (VATS) WITH RESECTION OF  RIGHT INTRAPULMONARY LYMPH NODE;  Surgeon: Grace Isaac, MD;  Location: [place];  Service: Thoracic;  Laterality: Right;  Marland Kitchen VIDEO BRONCHOSCOPY N/A 01/20/2019   Procedure: VIDEO BRONCHOSCOPY;  Surgeon: Grace Isaac, MD;  Location: [facility] OR;  Service: Thoracic;  Laterality: N/A;    There were no vitals filed for this visit.  Subjective Assessment - 10/13/19 0852    Subjective  Patient reporting mainly stiffness.    Patient Stated Goals  Want to be able to cut toenails on his right foot    Currently in Pain?  No/denies         [facility] PT Assessment - 10/13/19 0001      AROM   AROM Assessment Site  Knee    Right/Left Knee  Right    Right Knee Extension  3    Right Knee Flexion  117      PROM   PROM Assessment Site  Knee    Right/Left Knee  Right    Right Knee Extension  0    Right Knee Flexion  124                   [facility] Adult PT Treatment/Exercise - 10/13/19 0001      Knee/Hip Exercises: Stretches   Hip Flexor Stretch  Right;2 reps;30 seconds    Hip Flexor Stretch  Limitations  standing in warrior I position with left SB; also attempted on stairs.     Knee: Self-Stretch to increase Flexion  Right;5 reps;10 seconds    Piriformis Stretch  Right;1 rep;20 seconds      Knee/Hip Exercises: Aerobic   Stationary Bike  L8 x 7 min      Knee/Hip Exercises: Standing   Forward Step Up  Right;2 sets;10 reps   using Bosu ball   SLS  on bosu 5 x     Other Standing Knee Exercises  dead lift bends x 10 bil, 2x 5 unilaterally      Modalities   Modalities  Vasopneumatic      Vasopneumatic   Number Minutes Vasopneumatic   15 minutes    Vasopnuematic Location   Knee    Vasopneumatic Pressure  Medium    Vasopneumatic Temperature   34      Manual Therapy   Manual Therapy  Myofascial release    Myofascial Release  to right hip flexor in hooklying; also with active hip flexion x 10    Passive ROM  into extension in supine               PT Short Term Goals -  10/13/19 0916      PT SHORT TERM GOAL #1   Title  Ind with initial HEP    Status  Achieved      PT SHORT TERM GOAL #2   Title  Pt able to climb stairs with a reciprocal gait pattern    Status  Achieved        PT Long Term Goals - 10/13/19 0854      PT LONG TERM GOAL #1   Title  Pt to demo right knee flex 0-125 to normalize gait and ADLs    Baseline  3-117    Status  On-going      PT LONG TERM GOAL #2   Title  Patient able to perform ADLs without increased pain in the right knee.    Status  Achieved      PT LONG TERM GOAL #3   Title  Pt to demo 5/5 RLE strength    Status  Achieved      PT LONG TERM GOAL #4   Title  Patient able to sleep 6 hours without waking from knee pain    Status  Achieved      PT LONG TERM GOAL #5   Title  Patient able to cut his toenails without difficulty    Baseline  still a challenge as well as donning socks    Status  Partially Met            Plan - 10/13/19 0856    Clinical Impression Statement  Patient continues to progress with LTGs. He is still limited with ROM primarily due to edema and tight right hip flexor.  Functionally he still has difficulty donning socks, but demonstrated significant improvement with descending stairs today. He had no difficulty walking on uneven ground when golfing with no LOB this past weekend. Patient still having difficulty with higher level balance activities.    PT Frequency  2x / week    PT Duration  8 weeks    PT Treatment/Interventions  ADLs/Self Care Home Management;Electrical Stimulation;Cryotherapy;Gait training;Stair training;Therapeutic exercise;Balance training;Neuromuscular re-education;Patient/family education;Manual techniques;Passive range of motion;Taping;Vasopneumatic Device;Joint Manipulations    PT Next Visit Plan  manual for edema and tissue mobility/HF, knee strength and stability/balance    PT Home Exercise  Plan  [facility]       Patient will benefit from skilled therapeutic  intervention in order to improve the following deficits and impairments:  Abnormal gait, Decreased range of motion, Pain, Increased edema, Decreased strength, Impaired flexibility  Visit Diagnosis: Stiffness of right knee, not elsewhere classified  Localized edema  Other abnormalities of gait and mobility     Problem List Patient Active Problem List   Diagnosis Date Noted  . Unilateral primary osteoarthritis, right knee 07/30/2019  . 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 COLONIC POLYPS, ADENOMATOUS 05/24/2008  . DIVERTICULOSIS, COLON 05/24/2008    Madelyn Flavors PT 10/13/2019, 5:28 PM  [facility] [address], [place], [place], [address] Phone: [phone]   Fax:  [phone]  Name: [name] MRN: [id number] Date of Birth: March 01, 1951

## 2019-10-15 ENCOUNTER — Ambulatory Visit: Payer: Medicare Other

## 2019-10-15 ENCOUNTER — Ambulatory Visit (INDEPENDENT_AMBULATORY_CARE_PROVIDER_SITE_OTHER): Payer: Medicare Other | Admitting: Orthopaedic Surgery

## 2019-10-15 ENCOUNTER — Other Ambulatory Visit: Payer: Self-pay

## 2019-10-15 ENCOUNTER — Encounter: Payer: Self-pay | Admitting: Orthopaedic Surgery

## 2019-10-15 DIAGNOSIS — M25661 Stiffness of right knee, not elsewhere classified: Secondary | ICD-10-CM

## 2019-10-15 DIAGNOSIS — Z96651 Presence of right artificial knee joint: Secondary | ICD-10-CM

## 2019-10-15 DIAGNOSIS — R600 Localized edema: Secondary | ICD-10-CM

## 2019-10-15 DIAGNOSIS — R2689 Other abnormalities of gait and mobility: Secondary | ICD-10-CM

## 2019-10-15 DIAGNOSIS — M25561 Pain in right knee: Secondary | ICD-10-CM

## 2019-10-15 NOTE — Progress Notes (Signed)
The patient is now 11 weeks status post a right total knee arthroplasty.  He has been still going through physical therapy and making progress.  He states he lacks full extension by about 3 degrees and is flexing well.  His only medications are related to the swelling.  He has played some golf and is doing better from that standpoint.  He has no complaints.  On examination of his right operative knee his incisions healed nicely.  He does lack full extension by just a few degrees and he has good flexion overall.  The knee feels ligamentously stable.  This point he will continue physical therapy for the scheduled they have him on.  From my standpoint, I do not need to see him back for 3 months unless there are issues.  At that visit I would like a standing AP and lateral of the right knee.

## 2019-10-15 NOTE — Therapy (Signed)
[facility] [address], [place], [place], [address] Phone: [phone]   Fax:  [phone]  Physical Therapy Treatment  Patient Details  Name: [name] MRN: [id number] Date of Birth: April 11, 1951 Referring Provider (PT): Jean Rosenthal   Encounter Date: 10/15/2019  PT End of Session - 10/15/19 0952    Visit Number  15    Date for PT Re-Evaluation  10/22/19    Authorization Type  [employer]HC Medicare- KX needed    Progress Note Due on Visit  20    PT Start Time  0851    PT Stop Time  0947    PT Time Calculation (min)  56 min    Activity Tolerance  Patient tolerated treatment well    Behavior During Therapy  [facility] for tasks assessed/performed       Past Medical History:  Diagnosis Date  . Arthritis    knee, no meds  . Cancer [facility]) 2020   carcinoid tumor of right lung  . 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 Eczema 08/08/2010  . Family history of adverse reaction to anesthesia    mom- post op N/V  . GERD (gastroesophageal reflux disease)    weight loss resolved gerd, no medication  . Hyperlipidemia   . PONV (postoperative nausea and vomiting)   . Sleep apnea    uses CPAP nightly  . Unilateral primary osteoarthritis, right knee 07/30/2019    Past Surgical History:  Procedure Laterality Date  . COLONOSCOPY     hx polyps  . EYE SURGERY     lasik  . INTERCOSTAL NERVE BLOCK  01/20/2019   Procedure: Intercostal Nerve Block;  Surgeon: Grace Isaac, MD;  Location: [place];  Service: Thoracic;;  . KNEE ARTHROSCOPY Right   . SEPTOPLASTY  1980   x 2  . TONSILLECTOMY AND ADENOIDECTOMY  1960  . TOTAL KNEE ARTHROPLASTY Right 07/30/2019  . TOTAL KNEE ARTHROPLASTY Right 07/30/2019   Procedure: RIGHT TOTAL KNEE ARTHROPLASTY;  Surgeon: Mcarthur Rossetti, MD;  Location: [place];  Service: Orthopedics;  Laterality: Right;  Marland Kitchen VIDEO ASSISTED THORACOSCOPY (VATS)/WEDGE RESECTION Right 01/20/2019   Procedure: VIDEO  ASSISTED THORACOSCOPY (VATS) WITH RESECTION OF RIGHT INTRAPULMONARY LYMPH NODE;  Surgeon: Grace Isaac, MD;  Location: [place];  Service: Thoracic;  Laterality: Right;  Marland Kitchen VIDEO BRONCHOSCOPY N/A 01/20/2019   Procedure: VIDEO BRONCHOSCOPY;  Surgeon: Grace Isaac, MD;  Location: [facility] OR;  Service: Thoracic;  Laterality: N/A;    There were no vitals filed for this visit.  Subjective Assessment - 10/15/19 0859    Subjective  I'm feeling good.    Currently in Pain?  No/denies                       [facility] Adult PT Treatment/Exercise - 10/15/19 0001      Knee/Hip Exercises: Aerobic   Stationary Bike  L8 x 8 min      Knee/Hip Exercises: Machines for Strengthening   Cybex Knee Extension  30# bil extension 2x10, up with both and eccentric lowering 15# 2x10    Cybex Knee Flexion  45# bil 2x10    Cybex Leg Press  seat 5: 100# x 20 with prolonged hold into ext bil; unilateral just right 70# 2x10      Knee/Hip Exercises: Standing   Forward Step Up  Right;2 sets;10 reps   using Bosu ball   SLS  on bosu 5 x  Walking with Sports Cord  35# forward and reverse x 10 each, sidestepping 35 # x 10 each      Modalities   Modalities  Vasopneumatic      Vasopneumatic   Number Minutes Vasopneumatic   15 minutes    Vasopnuematic Location   Knee    Vasopneumatic Pressure  Medium    Vasopneumatic Temperature   34      Manual Therapy   Manual Therapy  Myofascial release    Myofascial Release  to right hip flexor in hooklying; also with active hip flexion x 10    Passive ROM  into extension in supine               PT Short Term Goals - 10/13/19 0916      PT SHORT TERM GOAL #1   Title  Ind with initial HEP    Status  Achieved      PT SHORT TERM GOAL #2   Title  Pt able to climb stairs with a reciprocal gait pattern    Status  Achieved        PT Long Term Goals - 10/13/19 0854      PT LONG TERM GOAL #1   Title  Pt to demo right knee flex 0-125 to normalize  gait and ADLs    Baseline  3-117    Status  On-going      PT LONG TERM GOAL #2   Title  Patient able to perform ADLs without increased pain in the right knee.    Status  Achieved      PT LONG TERM GOAL #3   Title  Pt to demo 5/5 RLE strength    Status  Achieved      PT LONG TERM GOAL #4   Title  Patient able to sleep 6 hours without waking from knee pain    Status  Achieved      PT LONG TERM GOAL #5   Title  Patient able to cut his toenails without difficulty    Baseline  still a challenge as well as donning socks    Status  Partially Met            Plan - 10/15/19 0950    Clinical Impression Statement  Patient continues to progress with LTGs. He is still limited with ROM primarily due to edema and tight right hip flexor.  Functionally he still has difficulty donning socks, but demonstrated significant improvement with descending stairs today. He had no difficulty walking on uneven ground when golfing with no LOB this past weekend. Patient still having difficulty with higher level balance activities.  PT provided verbal and demo cues for stability and alignment with exercise today.  Pt will see MD today.  If pt continues past 10/22/19, he will need recert with possibility to continue 1x/wk for the month of May.    PT Frequency  2x / week    PT Duration  8 weeks    PT Treatment/Interventions  ADLs/Self Care Home Management;Electrical Stimulation;Cryotherapy;Gait training;Stair training;Therapeutic exercise;Balance training;Neuromuscular re-education;Patient/family education;Manual techniques;Passive range of motion;Taping;Vasopneumatic Device;Joint Manipulations    PT Next Visit Plan  see what MD says.  If pt is going to continue PT after 8/46/65 cert date, a new certification is needed for 1x/wk.    PT Home Exercise Plan  [facility]    Consulted and Agree with Plan of Care  Patient       Patient will benefit from skilled therapeutic intervention in order  to improve the following  deficits and impairments:  Abnormal gait, Decreased range of motion, Pain, Increased edema, Decreased strength, Impaired flexibility  Visit Diagnosis: Stiffness of right knee, not elsewhere classified  Localized edema  Other abnormalities of gait and mobility  Acute pain of right knee     Problem List Patient Active Problem List   Diagnosis Date Noted  . Unilateral primary osteoarthritis, right knee 07/30/2019  . 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 COLONIC POLYPS, ADENOMATOUS 05/24/2008  . DIVERTICULOSIS, COLON 05/24/2008    Sigurd Sos, PT 10/15/19 9:55 AM  [facility] [address] [place], [place], [address] Phone: [phone]   Fax:  [phone]  Name: [name] MRN: [id number] Date of Birth: Nov 25, 1950

## 2019-10-20 ENCOUNTER — Encounter: Payer: Self-pay | Admitting: Physical Therapy

## 2019-10-20 ENCOUNTER — Other Ambulatory Visit: Payer: Self-pay

## 2019-10-20 ENCOUNTER — Ambulatory Visit: Payer: Medicare Other | Admitting: Physical Therapy

## 2019-10-20 DIAGNOSIS — M25661 Stiffness of right knee, not elsewhere classified: Secondary | ICD-10-CM | POA: Diagnosis not present

## 2019-10-20 DIAGNOSIS — R600 Localized edema: Secondary | ICD-10-CM

## 2019-10-20 NOTE — Therapy (Signed)
[facility] [address], [place], [place], [address] Phone: [phone]   Fax:  [phone]  Physical Therapy Treatment  Patient Details  Name: [name] MRN: [id number] Date of Birth: 12-15-1950 Referring Provider (PT): Jean Rosenthal   Encounter Date: 10/20/2019  PT End of Session - 10/20/19 0845    Visit Number  16    Date for PT Re-Evaluation  11/20/19    Authorization Type  [employer]HC Medicare- KX needed    Progress Note Due on Visit  20    PT Start Time  0845    PT Stop Time  0948    PT Time Calculation (min)  63 min    Activity Tolerance  Patient tolerated treatment well    Behavior During Therapy  [facility] for tasks assessed/performed       Past Medical History:  Diagnosis Date  . Arthritis    knee, no meds  . Cancer [facility]) 2020   carcinoid tumor of right lung  . 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 Eczema 08/08/2010  . Family history of adverse reaction to anesthesia    mom- post op N/V  . GERD (gastroesophageal reflux disease)    weight loss resolved gerd, no medication  . Hyperlipidemia   . PONV (postoperative nausea and vomiting)   . Sleep apnea    uses CPAP nightly  . Unilateral primary osteoarthritis, right knee 07/30/2019    Past Surgical History:  Procedure Laterality Date  . COLONOSCOPY     hx polyps  . EYE SURGERY     lasik  . INTERCOSTAL NERVE BLOCK  01/20/2019   Procedure: Intercostal Nerve Block;  Surgeon: Grace Isaac, MD;  Location: [place];  Service: Thoracic;;  . KNEE ARTHROSCOPY Right   . SEPTOPLASTY  1980   x 2  . TONSILLECTOMY AND ADENOIDECTOMY  1960  . TOTAL KNEE ARTHROPLASTY Right 07/30/2019  . TOTAL KNEE ARTHROPLASTY Right 07/30/2019   Procedure: RIGHT TOTAL KNEE ARTHROPLASTY;  Surgeon: Mcarthur Rossetti, MD;  Location: [place];  Service: Orthopedics;  Laterality: Right;  Marland Kitchen VIDEO ASSISTED THORACOSCOPY (VATS)/WEDGE RESECTION Right 01/20/2019   Procedure: VIDEO  ASSISTED THORACOSCOPY (VATS) WITH RESECTION OF RIGHT INTRAPULMONARY LYMPH NODE;  Surgeon: Grace Isaac, MD;  Location: [place];  Service: Thoracic;  Laterality: Right;  Marland Kitchen VIDEO BRONCHOSCOPY N/A 01/20/2019   Procedure: VIDEO BRONCHOSCOPY;  Surgeon: Grace Isaac, MD;  Location: [facility];  Service: Thoracic;  Laterality: N/A;    There were no vitals filed for this visit.  Subjective Assessment - 10/20/19 0845    Subjective  Saw MD and he does not need to go back for 3 months. He is still struggling with clipping toe nails. He reports he is very tight in right hip flexor and quads and the knee remains swollen.    Pertinent History  OA    Patient Stated Goals  Want to be able to cut toenails on his right foot    Currently in Pain?  No/denies                       [facility] Adult PT Treatment/Exercise - 10/20/19 0001      Knee/Hip Exercises: Stretches   Sports administrator  Right;3 reps    Hip Flexor Stretch  Right;2 reps;30 seconds    Hip Flexor Stretch Limitations  standing in warrior I position with left SB; also did in sitting.     Knee: Self-Stretch to  increase Flexion  Right;5 reps;10 seconds    Piriformis Stretch  Right;1 rep;20 seconds    Other Knee/Hip Stretches  seated flexion stretch 3x20 sec; supine rotation x 60 sec bil      Knee/Hip Exercises: Aerobic   Stationary Bike  L8 x 7 min      Knee/Hip Exercises: Machines for Strengthening   Cybex Knee Extension  30# bil extension and eccentric lowering 15# 2x10    Cybex Knee Flexion  45# bil 2x10    Cybex Leg Press  seat 5: 140# x 20 with prolonged hold into ext bil; unilateral just right 90# 2x10      Vasopneumatic   Number Minutes Vasopneumatic   15 minutes    Vasopnuematic Location   Knee    Vasopneumatic Pressure  Medium    Vasopneumatic Temperature   34               PT Short Term Goals - 10/13/19 0916      PT SHORT TERM GOAL #1   Title  Ind with initial HEP    Status  Achieved      PT SHORT TERM  GOAL #2   Title  Pt able to climb stairs with a reciprocal gait pattern    Status  Achieved        PT Long Term Goals - 10/20/19 1654      PT LONG TERM GOAL #1   Title  Pt to demo right knee flex 0-125 to normalize gait and ADLs    Baseline  3-114    Status  On-going      PT LONG TERM GOAL #2   Title  Patient able to perform ADLs without increased pain in the right knee.    Status  Achieved      PT LONG TERM GOAL #3   Title  Pt to demo 5/5 RLE strength    Status  Achieved      PT LONG TERM GOAL #4   Title  Patient able to sleep 6 hours without waking from knee pain    Status  Achieved      PT LONG TERM GOAL #5   Title  Patient able to cut his toenails without difficulty    Baseline  still a challenge as well as donning socks    Status  On-going            Plan - 10/20/19 0937    Clinical Impression Statement  Patient has progressed well toward goals, but is still having difficulty maintaining ROM goals. Today he measured 3-114 deg active right knee flexion. He continues to complain and exhibit marked tightness in his RLE. We focused on stretching today and his HEP was progressed. Patient still experiencing edema as well which is likely limiting ROM. He will benefit from continued PT to meet remaining goals.    Rehab Potential  Excellent    PT Frequency  1x / week    PT Duration  4 weeks    PT Treatment/Interventions  ADLs/Self Care Home Management;Electrical Stimulation;Cryotherapy;Gait training;Stair training;Therapeutic exercise;Balance training;Neuromuscular re-education;Patient/family education;Manual techniques;Passive range of motion;Taping;Vasopneumatic Device;Joint Manipulations    PT Next Visit Plan  recert sent; continue to work on flexibility and ROM    PT [place] and Agree with Plan of Care  Patient       Patient will benefit from skilled therapeutic intervention in order to improve the following deficits and impairments:   Abnormal  gait, Decreased range of motion, Pain, Increased edema, Decreased strength, Impaired flexibility  Visit Diagnosis: Stiffness of right knee, not elsewhere classified - Plan: PT plan of care cert/re-cert  Localized edema - Plan: PT plan of care cert/re-cert     Problem List Patient Active Problem List   Diagnosis Date Noted  . Unilateral primary osteoarthritis, right knee 07/30/2019  . 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 COLONIC POLYPS, ADENOMATOUS 05/24/2008  . DIVERTICULOSIS, COLON 05/24/2008    Madelyn Flavors PT 10/20/2019, 4:57 PM  [facility] [address], [place], [place], [address] Phone: [phone]   Fax:  [phone]  Name: [name] MRN: [id number] Date of Birth: 07-29-1950

## 2019-10-22 ENCOUNTER — Other Ambulatory Visit: Payer: Self-pay

## 2019-10-22 ENCOUNTER — Ambulatory Visit: Payer: Medicare Other

## 2019-10-22 DIAGNOSIS — M25661 Stiffness of right knee, not elsewhere classified: Secondary | ICD-10-CM | POA: Diagnosis not present

## 2019-10-22 DIAGNOSIS — R2689 Other abnormalities of gait and mobility: Secondary | ICD-10-CM

## 2019-10-22 DIAGNOSIS — M25561 Pain in right knee: Secondary | ICD-10-CM

## 2019-10-22 DIAGNOSIS — R600 Localized edema: Secondary | ICD-10-CM

## 2019-10-22 NOTE — Therapy (Signed)
[facility] [address], [place], [place], [address] Phone: [phone]   Fax:  [phone]  Physical Therapy Treatment  Patient Details  Name: [name] MRN: [id number] Date of Birth: Aug 13, 1950 Referring Provider (PT): Jean Rosenthal   Encounter Date: 10/22/2019  PT End of Session - 10/22/19 0933    Visit Number  17    Date for PT Re-Evaluation  11/20/19    Authorization Type  [employer]HC Medicare- KX needed    Progress Note Due on Visit  20    PT Start Time  0847    PT Stop Time  0948    PT Time Calculation (min)  61 min    Activity Tolerance  Patient tolerated treatment well    Behavior During Therapy  [facility] for tasks assessed/performed       Past Medical History:  Diagnosis Date  . Arthritis    knee, no meds  . Cancer [facility]) 2020   carcinoid tumor of right lung  . 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 Eczema 08/08/2010  . Family history of adverse reaction to anesthesia    mom- post op N/V  . GERD (gastroesophageal reflux disease)    weight loss resolved gerd, no medication  . Hyperlipidemia   . PONV (postoperative nausea and vomiting)   . Sleep apnea    uses CPAP nightly  . Unilateral primary osteoarthritis, right knee 07/30/2019    Past Surgical History:  Procedure Laterality Date  . COLONOSCOPY     hx polyps  . EYE SURGERY     lasik  . INTERCOSTAL NERVE BLOCK  01/20/2019   Procedure: Intercostal Nerve Block;  Surgeon: Grace Isaac, MD;  Location: [place];  Service: Thoracic;;  . KNEE ARTHROSCOPY Right   . SEPTOPLASTY  1980   x 2  . TONSILLECTOMY AND ADENOIDECTOMY  1960  . TOTAL KNEE ARTHROPLASTY Right 07/30/2019  . TOTAL KNEE ARTHROPLASTY Right 07/30/2019   Procedure: RIGHT TOTAL KNEE ARTHROPLASTY;  Surgeon: Mcarthur Rossetti, MD;  Location: [place];  Service: Orthopedics;  Laterality: Right;  Marland Kitchen VIDEO ASSISTED THORACOSCOPY (VATS)/WEDGE RESECTION Right 01/20/2019   Procedure: VIDEO  ASSISTED THORACOSCOPY (VATS) WITH RESECTION OF RIGHT INTRAPULMONARY LYMPH NODE;  Surgeon: Grace Isaac, MD;  Location: [place];  Service: Thoracic;  Laterality: Right;  Marland Kitchen VIDEO BRONCHOSCOPY N/A 01/20/2019   Procedure: VIDEO BRONCHOSCOPY;  Surgeon: Grace Isaac, MD;  Location: [facility] OR;  Service: Thoracic;  Laterality: N/A;    There were no vitals filed for this visit.  Subjective Assessment - 10/22/19 0852    Subjective  I'm doing well.    Currently in Pain?  No/denies                       [facility] Adult PT Treatment/Exercise - 10/22/19 0001      Knee/Hip Exercises: Stretches   Sports administrator  Right;3 reps    Hip Flexor Stretch  Right;2 reps;30 seconds      Knee/Hip Exercises: Aerobic   Stationary Bike  L8 x 8 min      Knee/Hip Exercises: Machines for Strengthening   Cybex Knee Extension  30# bil extension and eccentric lowering 30# 2x10    Cybex Knee Flexion  45# bil 2x10    Cybex Leg Press  seat 5: 140# x 20 with prolonged hold into ext bil; unilateral just right 90# 2x10      Knee/Hip Exercises: Standing   Forward Step Up  --  Rocker Board  3 minutes      Vasopneumatic   Number Minutes Vasopneumatic   15 minutes    Vasopnuematic Location   Knee    Vasopneumatic Pressure  Medium    Vasopneumatic Temperature   34      Manual Therapy   Manual Therapy  Myofascial release    Soft tissue mobilization  contract relax for quad and hip flexor stretch    Myofascial Release  to right hip flexor in hooklying; also with active hip flexion x 10               PT Short Term Goals - 10/13/19 0916      PT SHORT TERM GOAL #1   Title  Ind with initial HEP    Status  Achieved      PT SHORT TERM GOAL #2   Title  Pt able to climb stairs with a reciprocal gait pattern    Status  Achieved        PT Long Term Goals - 10/20/19 1654      PT LONG TERM GOAL #1   Title  Pt to demo right knee flex 0-125 to normalize gait and ADLs    Baseline  3-114     Status  On-going      PT LONG TERM GOAL #2   Title  Patient able to perform ADLs without increased pain in the right knee.    Status  Achieved      PT LONG TERM GOAL #3   Title  Pt to demo 5/5 RLE strength    Status  Achieved      PT LONG TERM GOAL #4   Title  Patient able to sleep 6 hours without waking from knee pain    Status  Achieved      PT LONG TERM GOAL #5   Title  Patient able to cut his toenails without difficulty    Baseline  still a challenge as well as donning socks    Status  On-going            Plan - 10/22/19 0915    Clinical Impression Statement  Patient has progressed well toward goals, but is still having difficulty maintaining ROM goals.  He continues to complain and exhibit marked tightness in his Rt LE. We focused on stretching  and strengthening today and his HEP was progressed this week. Patient still experiencing edema as well which is likely limiting ROM. He will benefit from continued PT to meet remaining goals.    PT Frequency  1x / week    PT Duration  4 weeks    PT Treatment/Interventions  ADLs/Self Care Home Management;Electrical Stimulation;Cryotherapy;Gait training;Stair training;Therapeutic exercise;Balance training;Neuromuscular re-education;Patient/family education;Manual techniques;Passive range of motion;Taping;Vasopneumatic Device;Joint Manipulations    PT Next Visit Plan  continue to work on flexibility and ROM    PT [place] and Agree with Plan of Care  Patient       Patient will benefit from skilled therapeutic intervention in order to improve the following deficits and impairments:  Abnormal gait, Decreased range of motion, Pain, Increased edema, Decreased strength, Impaired flexibility  Visit Diagnosis: Stiffness of right knee, not elsewhere classified  Localized edema  Other abnormalities of gait and mobility  Acute pain of right knee     Problem List Patient Active Problem List    Diagnosis Date Noted  . Unilateral primary osteoarthritis, right knee 07/30/2019  . Status post  total right knee replacement 07/30/2019  . 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 COLONIC POLYPS, ADENOMATOUS 05/24/2008  . DIVERTICULOSIS, COLON 05/24/2008    Sigurd Sos, PT 10/22/19 9:35 AM  [facility] [address] [place], [place], [address] Phone: [phone]   Fax:  [phone]  Name: [name] MRN: [id number] Date of Birth: 1951/04/18

## 2019-10-28 ENCOUNTER — Encounter: Payer: Self-pay | Admitting: Physical Therapy

## 2019-10-28 ENCOUNTER — Ambulatory Visit: Payer: Medicare Other | Attending: Orthopaedic Surgery | Admitting: Physical Therapy

## 2019-10-28 ENCOUNTER — Other Ambulatory Visit: Payer: Self-pay

## 2019-10-28 DIAGNOSIS — M25661 Stiffness of right knee, not elsewhere classified: Secondary | ICD-10-CM | POA: Insufficient documentation

## 2019-10-28 DIAGNOSIS — M25561 Pain in right knee: Secondary | ICD-10-CM | POA: Insufficient documentation

## 2019-10-28 DIAGNOSIS — R600 Localized edema: Secondary | ICD-10-CM | POA: Diagnosis present

## 2019-10-28 DIAGNOSIS — R2689 Other abnormalities of gait and mobility: Secondary | ICD-10-CM | POA: Diagnosis present

## 2019-10-28 NOTE — Therapy (Signed)
[facility] [address], [place], [place], [address] Phone: [phone]   Fax:  [phone]  Physical Therapy Treatment  Patient Details  Name: [name] MRN: [id number] Date of Birth: 16-Dec-1950 Referring Provider (PT): Jean Rosenthal   Encounter Date: 10/28/2019  PT End of Session - 10/28/19 1406    Visit Number  18    Date for PT Re-Evaluation  11/20/19    Authorization Type  [employer] Medicare- KX needed    Progress Note Due on Visit  20    PT Start Time  1400    PT Stop Time  1456    PT Time Calculation (min)  56 min    Activity Tolerance  Patient tolerated treatment well    Behavior During Therapy  [facility] for tasks assessed/performed       Past Medical History:  Diagnosis Date  . Arthritis    knee, no meds  . Cancer [facility]) 2020   carcinoid tumor of right lung  . 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 Eczema 08/08/2010  . Family history of adverse reaction to anesthesia    mom- post op N/V  . GERD (gastroesophageal reflux disease)    weight loss resolved gerd, no medication  . Hyperlipidemia   . PONV (postoperative nausea and vomiting)   . Sleep apnea    uses CPAP nightly  . Unilateral primary osteoarthritis, right knee 07/30/2019    Past Surgical History:  Procedure Laterality Date  . COLONOSCOPY     hx polyps  . EYE SURGERY     lasik  . INTERCOSTAL NERVE BLOCK  01/20/2019   Procedure: Intercostal Nerve Block;  Surgeon: Grace Isaac, MD;  Location: [place];  Service: Thoracic;;  . KNEE ARTHROSCOPY Right   . SEPTOPLASTY  1980   x 2  . TONSILLECTOMY AND ADENOIDECTOMY  1960  . TOTAL KNEE ARTHROPLASTY Right 07/30/2019  . TOTAL KNEE ARTHROPLASTY Right 07/30/2019   Procedure: RIGHT TOTAL KNEE ARTHROPLASTY;  Surgeon: Mcarthur Rossetti, MD;  Location: [place];  Service: Orthopedics;  Laterality: Right;  Marland Kitchen VIDEO ASSISTED THORACOSCOPY (VATS)/WEDGE RESECTION Right 01/20/2019   Procedure: VIDEO  ASSISTED THORACOSCOPY (VATS) WITH RESECTION OF RIGHT INTRAPULMONARY LYMPH NODE;  Surgeon: Grace Isaac, MD;  Location: [place];  Service: Thoracic;  Laterality: Right;  Marland Kitchen VIDEO BRONCHOSCOPY N/A 01/20/2019   Procedure: VIDEO BRONCHOSCOPY;  Surgeon: Grace Isaac, MD;  [facility];  Service: Thoracic;  Laterality: N/A;    There were no vitals filed for this visit.  Subjective Assessment - 10/28/19 1406    Subjective  No complaints. Some mild improvement in flexibilty.    Patient Stated Goals  Want to be able to cut toenails on his right foot    Currently in Pain?  No/denies         [facility] PT Assessment - 10/28/19 0001      AROM   AROM Assessment Site  Knee    Right/Left Knee  Right    Right Knee Extension  5    Right Knee Flexion  113      PROM   PROM Assessment Site  Knee    Right/Left Knee  Right    Right Knee Extension  0    Right Knee Flexion  118   measured at end of Rx after work into extension                  [facility] Adult PT Treatment/Exercise -  10/28/19 0001      Knee/Hip Exercises: Aerobic   Stationary Bike  L8 x 8 min      Knee/Hip Exercises: Machines for Strengthening   Cybex Knee Extension  30# bil extension and eccentric lowering 30# 2x10    Cybex Knee Flexion  45# bil 2x10    Cybex Leg Press  seat 5: 140# x 20 with prolonged hold into ext bil; unilateral just right 90# 2x10      Knee/Hip Exercises: Standing   Rocker Board  1 minute    Rocker Board Limitations  cues to tuck hips    Other Standing Knee Exercises  SLS reach x 5 bil for balance      Modalities   Modalities  Vasopneumatic      Vasopneumatic   Number Minutes Vasopneumatic   15 minutes    Vasopnuematic Location   Knee    Vasopneumatic Pressure  Medium    Vasopneumatic Temperature   34      Manual Therapy   Manual Therapy  Soft tissue mobilization    Manual therapy comments  right iliopsoas with decreased tension and no tenderness with palpation     Soft tissue  mobilization  contract/relax for HS release    Myofascial Release  to right popliteus and post knee with passive stretch into extension               PT Short Term Goals - 10/13/19 0916      PT SHORT TERM GOAL #1   Title  Ind with initial HEP    Status  Achieved      PT SHORT TERM GOAL #2   Title  Pt able to climb stairs with a reciprocal gait pattern    Status  Achieved        PT Long Term Goals - 10/28/19 1608      PT LONG TERM GOAL #1   Title  Pt to demo right knee flex 0-125 to normalize gait and ADLs    Baseline  5-113 active    Status  On-going      PT LONG TERM GOAL #5   Title  Patient able to cut his toenails without difficulty    Baseline  still a challenge as well as donning socks    Status  On-going            Plan - 10/28/19 1442    Clinical Impression Statement  Patient reporting he feels somewhat more flexible but AROM is slightly less than last assessment. Possibly due to focusing on ext today and measuring flexion at end. Significant tightness in right popliteus. May benefit from "x" of KT tape next visit. Patient still challenged by balance with SLS reach.    PT Frequency  1x / week    PT Duration  4 weeks    PT Treatment/Interventions  ADLs/Self Care Home Management;Electrical Stimulation;Cryotherapy;Gait training;Stair training;Therapeutic exercise;Balance training;Neuromuscular re-education;Patient/family education;Manual techniques;Passive range of motion;Taping;Vasopneumatic Device;Joint Manipulations    PT Next Visit Plan  progress note by 20th visit. Try KT tape in X over popliteus    PT Home Exercise Plan  [id number]    Consulted and Agree with Plan of Care  Patient       Patient will benefit from skilled therapeutic intervention in order to improve the following deficits and impairments:  Abnormal gait, Decreased range of motion, Pain, Increased edema, Decreased strength, Impaired flexibility  Visit Diagnosis: Stiffness of right knee,  not elsewhere classified  Localized edema  Other abnormalities of gait and mobility  Acute pain of right knee     Problem List Patient Active Problem List   Diagnosis Date Noted  . Unilateral primary osteoarthritis, right knee 07/30/2019  . 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 COLONIC POLYPS, ADENOMATOUS 05/24/2008  . DIVERTICULOSIS, COLON 05/24/2008   Madelyn Flavors PT 10/28/2019, 4:10 PM  [facility] [address], [place], [place], [address] Phone: [phone]   Fax:  [phone]  Name: [name] MRN: [id number] Date of Birth: 08-Sep-1950

## 2019-11-05 ENCOUNTER — Ambulatory Visit: Payer: Medicare Other

## 2019-11-05 ENCOUNTER — Other Ambulatory Visit: Payer: Self-pay

## 2019-11-05 DIAGNOSIS — R600 Localized edema: Secondary | ICD-10-CM

## 2019-11-05 DIAGNOSIS — M25561 Pain in right knee: Secondary | ICD-10-CM

## 2019-11-05 DIAGNOSIS — M25661 Stiffness of right knee, not elsewhere classified: Secondary | ICD-10-CM | POA: Diagnosis not present

## 2019-11-05 DIAGNOSIS — R2689 Other abnormalities of gait and mobility: Secondary | ICD-10-CM

## 2019-11-05 NOTE — Therapy (Signed)
[facility] [address] [place], [place], [address] Phone: [phone]   Fax:  [phone]  Physical Therapy Treatment  Patient Details  Name: [name] MRN: [id number] Date of Birth: [birth year] Referring Provider (PT): Jean Rosenthal   Encounter Date: 11/05/2019  PT End of Session - 11/05/19 1014    Visit Number  19    Date for PT Re-Evaluation  11/20/19    Authorization Type  [employer]HC Medicare- KX needed    Progress Note Due on Visit  20    PT Start Time  0930    PT Stop Time  1029    PT Time Calculation (min)  59 min    Activity Tolerance  Patient tolerated treatment well    Behavior During Therapy  [facility] for tasks assessed/performed       Past Medical History:  Diagnosis Date  . Arthritis    knee, no meds  . Cancer [facility]) 2020   carcinoid tumor of right lung  . 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 Eczema 08/08/2010  . Family history of adverse reaction to anesthesia    mom- post op N/V  . GERD (gastroesophageal reflux disease)    weight loss resolved gerd, no medication  . Hyperlipidemia   . PONV (postoperative nausea and vomiting)   . Sleep apnea    uses CPAP nightly  . Unilateral primary osteoarthritis, right knee 07/30/2019    Past Surgical History:  Procedure Laterality Date  . COLONOSCOPY     hx polyps  . EYE SURGERY     lasik  . INTERCOSTAL NERVE BLOCK  01/20/2019   Procedure: Intercostal Nerve Block;  Surgeon: Grace Isaac, MD;  Location: [place];  Service: Thoracic;;  . KNEE ARTHROSCOPY Right   . SEPTOPLASTY  1980   x 2  . TONSILLECTOMY AND ADENOIDECTOMY  1960  . TOTAL KNEE ARTHROPLASTY Right 07/30/2019  . TOTAL KNEE ARTHROPLASTY Right 07/30/2019   Procedure: RIGHT TOTAL KNEE ARTHROPLASTY;  Surgeon: Mcarthur Rossetti, MD;  Location: [place];  Service: Orthopedics;  Laterality: Right;  Marland Kitchen VIDEO ASSISTED THORACOSCOPY (VATS)/WEDGE RESECTION Right 01/20/2019   Procedure: VIDEO  ASSISTED THORACOSCOPY (VATS) WITH RESECTION OF RIGHT INTRAPULMONARY LYMPH NODE;  Surgeon: Grace Isaac, MD;  Location: [place];  Service: Thoracic;  Laterality: Right;  Marland Kitchen VIDEO BRONCHOSCOPY N/A 01/20/2019   Procedure: VIDEO BRONCHOSCOPY;  Surgeon: Grace Isaac, MD;  Location: [facility];  Service: Thoracic;  Laterality: N/A;    There were no vitals filed for this visit.  Subjective Assessment - 11/05/19 0930    Subjective  I am doing well.  Nothing new.    Currently in Pain?  No/denies         [facility] PT Assessment - 11/05/19 0001      Assessment   Medical Diagnosis  Right TKA    Referring Provider (PT)  Jean Rosenthal    Onset Date/Surgical Date  07/30/19      Prior Function   Level of Independence  Independent      Observation/Other Assessments   Focus on Therapeutic Outcomes (FOTO)   18% limited (goal was 23%)      Ambulation/Gait   Ambulation/Gait  Yes    Gait Pattern  Decreased stance time - right   mild antalgia                   [facility] Adult PT Treatment/Exercise - 11/05/19 0001      Knee/Hip Exercises:  Stretches   Hip Flexor Stretch  Left;Right;3 reps;20 seconds      Knee/Hip Exercises: Aerobic   Stationary Bike  L8 x 8 min   PT present to discuss status     Knee/Hip Exercises: Machines for Strengthening   Cybex Knee Extension  30# bil extension and eccentric lowering 30# 2x10    Cybex Knee Flexion  45# bil 2x10    Cybex Leg Press  seat 5: 140# x 20 with prolonged hold into ext bil; unilateral just right 90# 2x10      Knee/Hip Exercises: Standing   Rocker Board  3 minutes    Rocker Board Limitations  intermittent cues to tuck hips    SLS  slider vectors with SLR on the Rt 2x5 reps      Modalities   Modalities  Vasopneumatic      Vasopneumatic   Number Minutes Vasopneumatic   15 minutes    Vasopnuematic Location   Knee    Vasopneumatic Pressure  Medium    Vasopneumatic Temperature   34      Manual Therapy   Manual Therapy  Soft  tissue mobilization    Manual therapy comments  right iliopsoas with decreased tension and no tenderness with palpation     Myofascial Release  to right popliteus and post knee with passive stretch into extension               PT Short Term Goals - 10/13/19 0916      PT SHORT TERM GOAL #1   Title  Ind with initial HEP    Status  Achieved      PT SHORT TERM GOAL #2   Title  Pt able to climb stairs with a reciprocal gait pattern    Status  Achieved        PT Long Term Goals - 10/28/19 1608      PT LONG TERM GOAL #1   Title  Pt to demo right knee flex 0-125 to normalize gait and ADLs    Baseline  5-113 active    Status  On-going      PT LONG TERM GOAL #5   Title  Patient able to cut his toenails without difficulty    Baseline  still a challenge as well as donning socks    Status  On-going            Plan - 11/05/19 0943    Clinical Impression Statement  Patient reports he feels somewhat more flexible in the Rt hip flexors. Pt is challenged with higher level balance activities today.  Pt remains challenged by current level of weights.  Pt requires minor tactile cues for alignment and activation of glute med.  Pt with reduced tissue mobility at distal quads and hamstrings and at the Rt posterior knee. Pt with continued edema in the Rt knee.   Pt will continue to benefit from skilled PT to address Rt knee strength, endurance, flexibility and edema management s/p Rt TKA.    PT Frequency  1x / week    PT Duration  4 weeks    PT Treatment/Interventions  ADLs/Self Care Home Management;Electrical Stimulation;Cryotherapy;Gait training;Stair training;Therapeutic exercise;Balance training;Neuromuscular re-education;Patient/family education;Manual techniques;Passive range of motion;Taping;Vasopneumatic Device;Joint Manipulations    PT Next Visit Plan  20th visit progress note next session, edema management, Rt knee proprioception, strength, flexibility    PT Home Exercise Plan   [id number]    Consulted and Agree with Plan of Care  Patient  Patient will benefit from skilled therapeutic intervention in order to improve the following deficits and impairments:  Abnormal gait, Decreased range of motion, Pain, Increased edema, Decreased strength, Impaired flexibility  Visit Diagnosis: Stiffness of right knee, not elsewhere classified  Localized edema  Other abnormalities of gait and mobility  Acute pain of right knee     Problem List Patient Active Problem List   Diagnosis Date Noted  . Unilateral primary osteoarthritis, right knee 07/30/2019  . 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 COLONIC POLYPS, ADENOMATOUS 05/24/2008  . DIVERTICULOSIS, COLON 05/24/2008     Sigurd Sos, PT 11/05/19 10:16 AM  [facility] [address], [place], [place], [address] Phone: [phone]   Fax:  [phone]  Name: [name] MRN: [id number] Date of Birth: 14-Apr-1951

## 2019-11-10 ENCOUNTER — Ambulatory Visit: Payer: Medicare Other

## 2019-11-10 ENCOUNTER — Other Ambulatory Visit: Payer: Self-pay

## 2019-11-10 DIAGNOSIS — R600 Localized edema: Secondary | ICD-10-CM

## 2019-11-10 DIAGNOSIS — M25661 Stiffness of right knee, not elsewhere classified: Secondary | ICD-10-CM

## 2019-11-10 DIAGNOSIS — M25561 Pain in right knee: Secondary | ICD-10-CM

## 2019-11-10 DIAGNOSIS — R2689 Other abnormalities of gait and mobility: Secondary | ICD-10-CM

## 2019-11-10 NOTE — Therapy (Signed)
[facility] [address], [place], [place], [address] Phone: [phone]   Fax:  [phone]  Physical Therapy Treatment  Patient Details  Name: [name] MRN: [id number] Date of Birth: 02-29-1951 Referring Provider (PT): Jean Rosenthal   Encounter Date: 11/10/2019 Progress Note Reporting Period 10/01/19 to 11/10/19  See note below for Objective Data and Assessment of Progress/Goals.      PT End of Session - 11/10/19 1015    Visit Number  20    Date for PT Re-Evaluation  11/20/19    Authorization Type  [employer]HC Medicare- KX needed    Progress Note Due on Visit  20    PT Start Time  0932    PT Stop Time  1028    PT Time Calculation (min)  56 min    Activity Tolerance  Patient tolerated treatment well    Behavior During Therapy  [facility] for tasks assessed/performed       Past Medical History:  Diagnosis Date  . Arthritis    knee, no meds  . Cancer [facility]) 2020   carcinoid tumor of right lung  . 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 Eczema 08/08/2010  . Family history of adverse reaction to anesthesia    mom- post op N/V  . GERD (gastroesophageal reflux disease)    weight loss resolved gerd, no medication  . Hyperlipidemia   . PONV (postoperative nausea and vomiting)   . Sleep apnea    uses CPAP nightly  . Unilateral primary osteoarthritis, right knee 07/30/2019    Past Surgical History:  Procedure Laterality Date  . COLONOSCOPY     hx polyps  . EYE SURGERY     lasik  . INTERCOSTAL NERVE BLOCK  01/20/2019   Procedure: Intercostal Nerve Block;  Surgeon: Grace Isaac, MD;  Location: [place];  Service: Thoracic;;  . KNEE ARTHROSCOPY Right   . SEPTOPLASTY  1980   x 2  . TONSILLECTOMY AND ADENOIDECTOMY  1960  . TOTAL KNEE ARTHROPLASTY Right 07/30/2019  . TOTAL KNEE ARTHROPLASTY Right 07/30/2019   Procedure: RIGHT TOTAL KNEE ARTHROPLASTY;  Surgeon: Mcarthur Rossetti, MD;  Location: [place];   Service: Orthopedics;  Laterality: Right;  Marland Kitchen VIDEO ASSISTED THORACOSCOPY (VATS)/WEDGE RESECTION Right 01/20/2019   Procedure: VIDEO ASSISTED THORACOSCOPY (VATS) WITH RESECTION OF RIGHT INTRAPULMONARY LYMPH NODE;  Surgeon: Grace Isaac, MD;  Location: [place];  Service: Thoracic;  Laterality: Right;  Marland Kitchen VIDEO BRONCHOSCOPY N/A 01/20/2019   Procedure: VIDEO BRONCHOSCOPY;  Surgeon: Grace Isaac, MD;  Location: [facility] OR;  Service: Thoracic;  Laterality: N/A;    There were no vitals filed for this visit.  Subjective Assessment - 11/10/19 0943    Subjective  I lifted a tea kettle this morning and pulled my back.    Currently in Pain?  No/denies         [facility] PT Assessment - 11/10/19 0001      Assessment   Medical Diagnosis  Right TKA    Referring Provider (PT)  Jean Rosenthal    Onset Date/Surgical Date  07/30/19      Cognition   Overall Cognitive Status  Within Functional Limits for tasks assessed      Observation/Other Assessments   Focus on Therapeutic Outcomes (FOTO)   18% limited (goal was 23%)      AROM   Right Knee Extension  3   after passive stretching   Right Knee Flexion  116  Ambulation/Gait   Ambulation/Gait  Yes    Gait Pattern  Decreased stance time - right   mild antalgia                   [facility] Adult PT Treatment/Exercise - 11/10/19 0001      Knee/Hip Exercises: Stretches   Hip Flexor Stretch  Left;Right;3 reps;20 seconds      Knee/Hip Exercises: Aerobic   Stationary Bike  L8 x 8 min   PT present to discuss status     Knee/Hip Exercises: Machines for Strengthening   Cybex Knee Extension  --   not completed due to LBP   Cybex Knee Flexion  --   not completed due to LBP   Cybex Leg Press  seat 5: 140# x 20 with prolonged hold into ext bil; unilateral just right 90# 2x10      Knee/Hip Exercises: Standing   Rocker Board  3 minutes    Rocker Board Limitations  intermittent cues to tuck hips      Modalities   Modalities   Vasopneumatic      Vasopneumatic   Number Minutes Vasopneumatic   15 minutes    Vasopnuematic Location   Knee    Vasopneumatic Pressure  Medium    Vasopneumatic Temperature   34      Manual Therapy   Manual therapy comments  right iliopsoas with decreased tension and no tenderness with palpation     Myofascial Release  distal quads and hamstrings and passive stretch into extension               PT Short Term Goals - 10/13/19 0916      PT SHORT TERM GOAL #1   Title  Ind with initial HEP    Status  Achieved      PT SHORT TERM GOAL #2   Title  Pt able to climb stairs with a reciprocal gait pattern    Status  Achieved        PT Long Term Goals - 11/10/19 0945      PT LONG TERM GOAL #2   Title  Patient able to perform ADLs without increased pain in the right knee.    Status  Achieved      PT LONG TERM GOAL #3   Title  Pt to demo 5/5 RLE strength    Status  Achieved      PT LONG TERM GOAL #4   Title  Patient able to sleep 6 hours without waking from knee pain    Baseline  no longer waking at night    Status  Achieved      PT LONG TERM GOAL #5   Title  Patient able to cut his toenails without difficulty    Baseline  still a challenge as well as donning socks due to limited A/ROm    Time  8    Period  Weeks    Status  On-going            Plan - 11/10/19 1001    Clinical Impression Statement  Pt continues to make steady progress s/p Rt TKA.  Pt with main limitations related to limited Rt knee A/ROM flexion including care of feet and donning/doffing socks. Pt demonstrates mild antalgia with reduced Rt knee extension in stance and reduced stance time on the Rt. Rt knee strength is 5/5 throughout and pt demonstrates reduced stability and proprioception with single limb activity.  Activity was limited today due to flare-up  of LBP.  Pt will continue to benefit from skilled PT to address Rt knee strength, stability and flexibility.    PT Frequency  1x / week    PT  Duration  8 weeks    PT Treatment/Interventions  ADLs/Self Care Home Management;Electrical Stimulation;Cryotherapy;Gait training;Stair training;Therapeutic exercise;Balance training;Neuromuscular re-education;Patient/family education;Manual techniques;Passive range of motion;Taping;Vasopneumatic Device;Joint Manipulations    PT Next Visit Plan  edema management, Rt knee proprioception, strength, flexibility    PT Home Exercise Plan  [facility]       Patient will benefit from skilled therapeutic intervention in order to improve the following deficits and impairments:  Abnormal gait, Decreased range of motion, Pain, Increased edema, Decreased strength, Impaired flexibility  Visit Diagnosis: Localized edema  Stiffness of right knee, not elsewhere classified  Other abnormalities of gait and mobility  Acute pain of right knee     Problem List Patient Active Problem List   Diagnosis Date Noted  . Unilateral primary osteoarthritis, right knee 07/30/2019  . 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 COLONIC POLYPS, ADENOMATOUS 05/24/2008  . DIVERTICULOSIS, COLON 05/24/2008    Sigurd Sos, PT 11/10/19 10:17 AM  [facility] [address], [place], [place], [address] Phone: [phone]   Fax:  [phone]  Name: [name] MRN: [id number] Date of Birth: [birth year]

## 2019-11-17 ENCOUNTER — Other Ambulatory Visit: Payer: Self-pay

## 2019-11-17 ENCOUNTER — Ambulatory Visit: Payer: Medicare Other

## 2019-11-17 DIAGNOSIS — M25561 Pain in right knee: Secondary | ICD-10-CM

## 2019-11-17 DIAGNOSIS — M25661 Stiffness of right knee, not elsewhere classified: Secondary | ICD-10-CM

## 2019-11-17 DIAGNOSIS — R600 Localized edema: Secondary | ICD-10-CM

## 2019-11-17 DIAGNOSIS — R2689 Other abnormalities of gait and mobility: Secondary | ICD-10-CM

## 2019-11-17 NOTE — Therapy (Signed)
[facility] [address] [place], [place], [address] Phone: [phone]   Fax:  [phone]  Physical Therapy Treatment  Patient Details  Name: [name] MRN: [id number] Date of Birth: 12/11/1950 Referring Provider (PT): Jean Rosenthal   Encounter Date: 11/17/2019  PT End of Session - 11/17/19 1028    Visit Number  21    Date for PT Re-Evaluation  12/01/19    Authorization Type  [employer] Medicare- KX needed    PT Start Time  0931    PT Stop Time  1031    PT Time Calculation (min)  60 min    Activity Tolerance  Patient tolerated treatment well    Behavior During Therapy  [facility] for tasks assessed/performed       Past Medical History:  Diagnosis Date  . Arthritis    knee, no meds  . Cancer [facility]) 2020   carcinoid tumor of right lung  . 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 Eczema 08/08/2010  . Family history of adverse reaction to anesthesia    mom- post op N/V  . GERD (gastroesophageal reflux disease)    weight loss resolved gerd, no medication  . Hyperlipidemia   . PONV (postoperative nausea and vomiting)   . Sleep apnea    uses CPAP nightly  . Unilateral primary osteoarthritis, right knee 07/30/2019    Past Surgical History:  Procedure Laterality Date  . COLONOSCOPY     hx polyps  . EYE SURGERY     lasik  . INTERCOSTAL NERVE BLOCK  01/20/2019   Procedure: Intercostal Nerve Block;  Surgeon: Grace Isaac, MD;  Location: [facility];  Service: Thoracic;;  . KNEE ARTHROSCOPY Right   . SEPTOPLASTY  1980   x 2  . TONSILLECTOMY AND ADENOIDECTOMY  1960  . TOTAL KNEE ARTHROPLASTY Right 07/30/2019  . TOTAL KNEE ARTHROPLASTY Right 07/30/2019   Procedure: RIGHT TOTAL KNEE ARTHROPLASTY;  Surgeon: Mcarthur Rossetti, MD;  Location: [facility];  Service: Orthopedics;  Laterality: Right;  Marland Kitchen VIDEO ASSISTED THORACOSCOPY (VATS)/WEDGE RESECTION Right 01/20/2019   Procedure: VIDEO ASSISTED THORACOSCOPY (VATS) WITH  RESECTION OF RIGHT INTRAPULMONARY LYMPH NODE;  Surgeon: Grace Isaac, MD;  Location: [facility];  Service: Thoracic;  Laterality: Right;  Marland Kitchen VIDEO BRONCHOSCOPY N/A 01/20/2019   Procedure: VIDEO BRONCHOSCOPY;  Surgeon: Grace Isaac, MD;  Location: [facility] OR;  Service: Thoracic;  Laterality: N/A;    There were no vitals filed for this visit.      [facility] PT Assessment - 11/17/19 0001      Assessment   Medical Diagnosis  Right TKA    Referring Provider (PT)  Jean Rosenthal    Onset Date/Surgical Date  07/30/19      Cognition   Overall Cognitive Status  Within Functional Limits for tasks assessed      Observation/Other Assessments   Focus on Therapeutic Outcomes (FOTO)   18% limited (goal was 23%)      Circumferential Edema   Circumferential - Right  46 cm      AROM   Right Knee Extension  4    Right Knee Flexion  120      Strength   Right Knee Flexion  5/5    Right Knee Extension  5/5      Palpation   Palpation comment  Rt quad and hip flexor is tender.        Ambulation/Gait   Ambulation/Gait  Yes    Gait Pattern  Decreased stance time - right   mild antalgia                   [facility] Adult PT Treatment/Exercise - 11/17/19 0001      Knee/Hip Exercises: Stretches   Hip Flexor Stretch  Left;Right;3 reps;20 seconds      Knee/Hip Exercises: Aerobic   Stationary Bike  L8 x 8 min   PT present to discuss status     Knee/Hip Exercises: Machines for Strengthening   Cybex Knee Extension  30# bil extension and eccentric lowering 30# 2x10    Cybex Knee Flexion  45# bil 2x10    Cybex Leg Press  seat 5: 140# x 20 with prolonged hold into ext bil; unilateral just right 90# 2x10      Knee/Hip Exercises: Standing   Rocker Board  3 minutes    Rocker Board Limitations  intermittent cues to tuck hips      Modalities   Modalities  Vasopneumatic      Vasopneumatic   Number Minutes Vasopneumatic   15 minutes    Vasopnuematic Location   Knee    Vasopneumatic  Pressure  Medium    Vasopneumatic Temperature   34      Manual Therapy   Myofascial Release  distal quads and hamstrings and passive stretch into extension               PT Short Term Goals - 10/13/19 0916      PT SHORT TERM GOAL #1   Title  Ind with initial HEP    Status  Achieved      PT SHORT TERM GOAL #2   Title  Pt able to climb stairs with a reciprocal gait pattern    Status  Achieved        PT Long Term Goals - 11/17/19 1002      PT LONG TERM GOAL #2   Title  Patient able to perform ADLs without increased pain in the right knee.    Status  Achieved      PT LONG TERM GOAL #3   Title  Pt to demo 5/5 RLE strength    Status  Achieved      PT LONG TERM GOAL #4   Title  Patient able to sleep 6 hours without waking from knee pain    Status  Achieved      PT LONG TERM GOAL #5   Title  Patient able to cut his toenails without difficulty    Baseline  still a challenge as well as donning socks due to limited A/ROm    Time  2    Period  Weeks    Status  On-going    Target Date  12/01/19            Plan - 11/17/19 1023    Clinical Impression Statement  Pt will attend 1 more PT session after today.  Pt is limited in putting on shoes/socks and clipping toe nails  Pt is tight in hip ER and will work on stretching consistently into this motion.  Rt knee A/ROM is 4-120 degrees and strength is 5/5.  Pt with some instability with single leg activity and this is improving.  Pt will benefit from 1 more session for finalize HEP and work on hip ER and quad mobilization.    PT Frequency  1x / week    PT Duration  2 weeks    PT Treatment/Interventions  ADLs/Self Care Home Management;Electrical  Stimulation;Cryotherapy;Gait training;Stair training;Therapeutic exercise;Balance training;Neuromuscular re-education;Patient/family education;Manual techniques;Passive range of motion;Taping;Vasopneumatic Device;Joint Manipulations    PT Next Visit Plan  edema management, Rt knee  proprioception, strength, flexibility.  1 more session.  D/C FOTO    PT Home Exercise Plan  [facility]    Recommended Other Services  recert sent [phone]    Consulted and Agree with Plan of Care  Patient       Patient will benefit from skilled therapeutic intervention in order to improve the following deficits and impairments:  Abnormal gait, Decreased range of motion, Pain, Increased edema, Decreased strength, Impaired flexibility  Visit Diagnosis: Localized edema  Stiffness of right knee, not elsewhere classified  Other abnormalities of gait and mobility  Acute pain of right knee     Problem List Patient Active Problem List   Diagnosis Date Noted  . Unilateral primary osteoarthritis, right knee 07/30/2019  . 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 COLONIC POLYPS, ADENOMATOUS 05/24/2008  . DIVERTICULOSIS, COLON 05/24/2008     Sigurd Sos, PT 11/17/19 10:30 AM  [facility] [address], [place], [place], [address] Phone: [phone]   Fax:  [phone]  Name: BART BARMES MRN: [id number] Date of Birth: [birth year]

## 2019-11-24 ENCOUNTER — Ambulatory Visit: Payer: Medicare Other | Attending: Orthopaedic Surgery

## 2019-11-24 ENCOUNTER — Other Ambulatory Visit: Payer: Self-pay

## 2019-11-24 DIAGNOSIS — M25661 Stiffness of right knee, not elsewhere classified: Secondary | ICD-10-CM | POA: Diagnosis present

## 2019-11-24 DIAGNOSIS — R600 Localized edema: Secondary | ICD-10-CM | POA: Insufficient documentation

## 2019-11-24 DIAGNOSIS — M25561 Pain in right knee: Secondary | ICD-10-CM | POA: Diagnosis present

## 2019-11-24 DIAGNOSIS — R2689 Other abnormalities of gait and mobility: Secondary | ICD-10-CM | POA: Diagnosis present

## 2019-11-24 NOTE — Therapy (Signed)
[facility] [address], [place], [place], [address] Phone: [phone]   Fax:  [phone]  Physical Therapy Treatment  Patient Details  Name: [name] MRN: [id number] Date of Birth: 10-24-1950 Referring Provider (PT): Jean Rosenthal   Encounter Date: 11/24/2019  PT End of Session - 11/24/19 1015    Visit Number  19    Authorization Type  [employer]HC Medicare- KX needed    Progress Note Due on Visit  20    PT Start Time  0932    PT Stop Time  1027    PT Time Calculation (min)  55 min    Activity Tolerance  Patient tolerated treatment well    Behavior During Therapy  [facility] for tasks assessed/performed       Past Medical History:  Diagnosis Date   Arthritis    knee, no meds   Cancer ([place]) 2020   carcinoid tumor of right lung   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   Eczema 08/08/2010   Family history of adverse reaction to anesthesia    mom- post op N/V   GERD (gastroesophageal reflux disease)    weight loss resolved gerd, no medication   Hyperlipidemia    PONV (postoperative nausea and vomiting)    Sleep apnea    uses CPAP nightly   Unilateral primary osteoarthritis, right knee 07/30/2019    Past Surgical History:  Procedure Laterality Date   COLONOSCOPY     hx polyps   EYE SURGERY     lasik   INTERCOSTAL NERVE BLOCK  01/20/2019   Procedure: Intercostal Nerve Block;  Surgeon: Grace Isaac, MD;  Location: [facility];  Service: Thoracic;;   KNEE ARTHROSCOPY Right    SEPTOPLASTY  1980   x 2   TONSILLECTOMY AND ADENOIDECTOMY  1960   TOTAL KNEE ARTHROPLASTY Right 07/30/2019   TOTAL KNEE ARTHROPLASTY Right 07/30/2019   Procedure: RIGHT TOTAL KNEE ARTHROPLASTY;  Surgeon: Mcarthur Rossetti, MD;  Location: [facility];  Service: Orthopedics;  Laterality: Right;   VIDEO ASSISTED THORACOSCOPY (VATS)/WEDGE RESECTION Right 01/20/2019   Procedure: VIDEO ASSISTED THORACOSCOPY (VATS) WITH  RESECTION OF RIGHT INTRAPULMONARY LYMPH NODE;  Surgeon: Grace Isaac, MD;  Location: [facility];  Service: Thoracic;  Laterality: Right;   VIDEO BRONCHOSCOPY N/A 01/20/2019   Procedure: VIDEO BRONCHOSCOPY;  Surgeon: Grace Isaac, MD;  Location: [facility];  Service: Thoracic;  Laterality: N/A;    There were no vitals filed for this visit.  Subjective Assessment - 11/24/19 0942    Subjective  I am doing well and ready for D/C.    Currently in Pain?  No/denies         [facility] PT Assessment - 11/24/19 0001      Assessment   Medical Diagnosis  Right TKA    Referring Provider (PT)  Jean Rosenthal    Onset Date/Surgical Date  07/30/19      Cognition   Overall Cognitive Status  Within Functional Limits for tasks assessed      Observation/Other Assessments   Focus on Therapeutic Outcomes (FOTO)   18% limited (goal was 23%)      Circumferential Edema   Circumferential - Right  46 cm      AROM   Right Knee Extension  3    Right Knee Flexion  122      Strength   Right Knee Flexion  5/5    Right Knee Extension  5/5  Palpation   Palpation comment  Rt quad and hip flexor is tender.                      [facility] Adult PT Treatment/Exercise - 11/24/19 0001      Knee/Hip Exercises: Stretches   Hip Flexor Stretch  Left;Right;3 reps;20 seconds      Knee/Hip Exercises: Aerobic   Stationary Bike  L8 x 8 min   PT present to discuss status     Knee/Hip Exercises: Machines for Strengthening   Cybex Knee Extension  30# bil extension and eccentric lowering 30# 2x10    Cybex Knee Flexion  45# bil 2x10    Cybex Leg Press  seat 5: 140# x 20 with prolonged hold into ext bil; unilateral just right 90# 2x10      Knee/Hip Exercises: Standing   Rocker Board  3 minutes    Rocker Board Limitations  intermittent cues to tuck hips      Modalities   Modalities  Vasopneumatic      Vasopneumatic   Number Minutes Vasopneumatic   15 minutes    Vasopnuematic Location   Knee     Vasopneumatic Pressure  Medium    Vasopneumatic Temperature   34      Manual Therapy   Myofascial Release  distal quads and hamstrings and passive stretch into extension               PT Short Term Goals - 10/13/19 0916      PT SHORT TERM GOAL #1   Title  Ind with initial HEP    Status  Achieved      PT SHORT TERM GOAL #2   Title  Pt able to climb stairs with a reciprocal gait pattern    Status  Achieved        PT Long Term Goals - 11/24/19 0945      PT LONG TERM GOAL #2   Title  Patient able to perform ADLs without increased pain in the right knee.    Status  Achieved      PT LONG TERM GOAL #3   Title  Pt to demo 5/5 RLE strength    Status  Achieved      PT LONG TERM GOAL #4   Title  Patient able to sleep 6 hours without waking from knee pain    Status  Achieved      PT LONG TERM GOAL #5   Title  Patient able to cut his toenails without difficulty    Baseline  still a challenge as well as donning socks due to limited A/ROM    Status  Achieved            Plan - 11/24/19 1001    Clinical Impression Statement  Pt is ready for D/C to HEP.  Pt denies any functional limitations except for difficulty with putting on socks/shoes due to hip and knee flexibility deficits.  Pt will continue to work on strength and flexibility at home.  Pt will follow-up with MD as needed.    PT Next Visit Plan  D/C PT to HEP    PT Home Exercise Plan  [facility]    Consulted and Agree with Plan of Care  Patient       Patient will benefit from skilled therapeutic intervention in order to improve the following deficits and impairments:     Visit Diagnosis: Localized edema  Stiffness of right knee, not elsewhere classified  Other abnormalities of gait and mobility  Acute pain of right knee     Problem List Patient Active Problem List   Diagnosis Date Noted   Unilateral primary osteoarthritis, right knee 07/30/2019   Status post total right knee replacement  07/30/2019   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     Remaining deficits: Limited Rt hip and knee AROM for putting on shoes/socks.  Pt continues to work on this.     Education / Equipment: HEP Plan: Patient agrees to discharge.  Patient goals were met. Patient is being discharged due to meeting the stated rehab goals.  ?????        Sigurd Sos, PT 11/24/19 10:18 AM   [facility] [address] [place], [place], [address] Phone: [phone]   Fax:  [phone]  Name: [name] MRN: [id number] Date of Birth: 1950/11/11

## 2020-01-21 ENCOUNTER — Ambulatory Visit (INDEPENDENT_AMBULATORY_CARE_PROVIDER_SITE_OTHER): Payer: Medicare Other | Admitting: Orthopaedic Surgery

## 2020-01-21 ENCOUNTER — Encounter: Payer: Self-pay | Admitting: Orthopaedic Surgery

## 2020-01-21 ENCOUNTER — Ambulatory Visit: Payer: Self-pay

## 2020-01-21 DIAGNOSIS — Z96651 Presence of right artificial knee joint: Secondary | ICD-10-CM | POA: Diagnosis not present

## 2020-01-21 NOTE — Progress Notes (Signed)
The patient is now 76-month status post a right total knee arthroplasty.  He said he is doing well with range of motion and strength in that knee and has no complaints.  He has been playing golf as well.  Examination the right operative knee his range of motion is almost full.  His knee is ligamentously stable.  There is still some swelling to be expected at just 5 months postoperative.  He knows it can take about a year to get all the swelling of the knee.  Overall the knee feels stable and he is very satisfied.  2 views of the right knee show well-seated total knee arthroplasty with no complicating features.  From my standpoint he can resume full activities as he tolerates.  He can even jog on occasion.  I do not want him to be a daily runner he understands that.  We will see him back for final visit in 6 months with a final AP and lateral of the right knee.  If there is any issues before then he will let us know.  All questions and concerns were answered and addressed.

## 2020-03-29 ENCOUNTER — Ambulatory Visit: Payer: Medicare Other | Attending: Internal Medicine

## 2020-03-29 DIAGNOSIS — Z23 Encounter for immunization: Secondary | ICD-10-CM

## 2020-03-29 NOTE — Progress Notes (Signed)
   Covid-19 Vaccination Clinic  Name:  ABED SCHAR    MRN: [id number] DOB: [birth year]  03/29/2020  Mr. Mullarkey was observed post Covid-19 immunization for 15 minutes without incident. He was provided with Vaccine Information Sheet and instruction to access the V-Safe system.   Mr. Gander was instructed to call 911 with any severe reactions post vaccine: Marland Kitchen Difficulty breathing  . Swelling of face and throat  . A fast heartbeat  . A bad rash all over body  . Dizziness and weakness

## 2020-04-13 ENCOUNTER — Ambulatory Visit (INDEPENDENT_AMBULATORY_CARE_PROVIDER_SITE_OTHER): Payer: Medicare Other | Admitting: Dermatology

## 2020-04-13 ENCOUNTER — Other Ambulatory Visit: Payer: Self-pay

## 2020-04-13 ENCOUNTER — Encounter: Payer: Self-pay | Admitting: Dermatology

## 2020-04-13 DIAGNOSIS — L409 Psoriasis, unspecified: Secondary | ICD-10-CM

## 2020-04-13 DIAGNOSIS — L570 Actinic keratosis: Secondary | ICD-10-CM

## 2020-04-13 DIAGNOSIS — Z1283 Encounter for screening for malignant neoplasm of skin: Secondary | ICD-10-CM | POA: Diagnosis not present

## 2020-04-13 MED ORDER — HPR PLUS EX CREA: 1.0000 "application " | TOPICAL_CREAM | Freq: Every evening | CUTANEOUS | 1 refills | Status: DC

## 2020-04-13 MED ORDER — TRIAMCINOLONE ACETONIDE 0.1 % EX CREA: 1.0000 "application " | TOPICAL_CREAM | Freq: Every day | CUTANEOUS | 1 refills | Status: DC

## 2020-04-13 MED ORDER — TOLAK 4 % EX CREA: 1.0000 "application " | TOPICAL_CREAM | Freq: Every evening | CUTANEOUS | 0 refills | Status: DC

## 2020-05-01 IMAGING — CR CHEST - 2 VIEW
2 series · 2 of 2 positions shown · non-contrast
Comparison: PET-CT 12/25/2018, CT chest 12/23/2018

CLINICAL DATA: Lung nodule RIGHT lung

EXAM:
CHEST - 2 VIEW

[w chest pa]
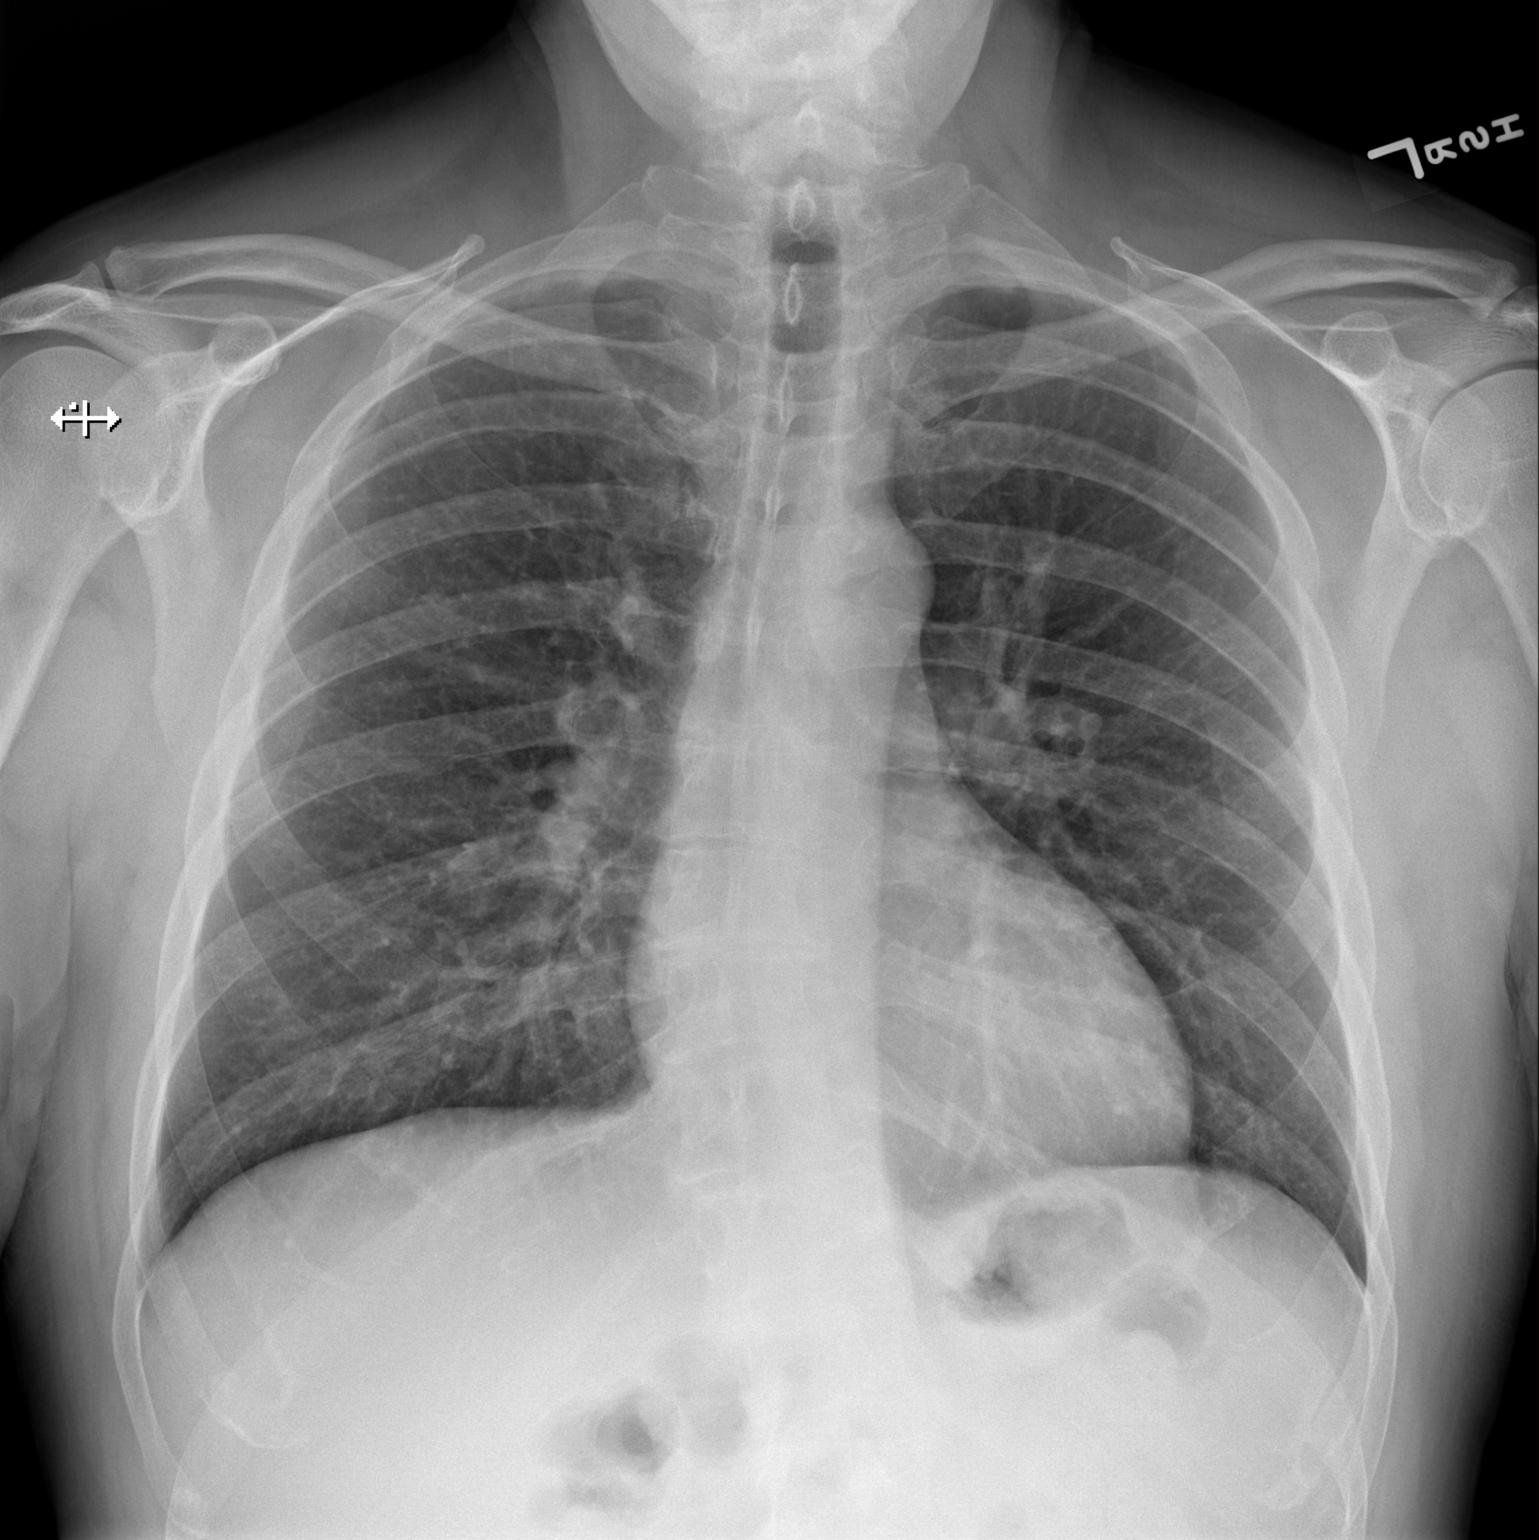

[w chest lat]
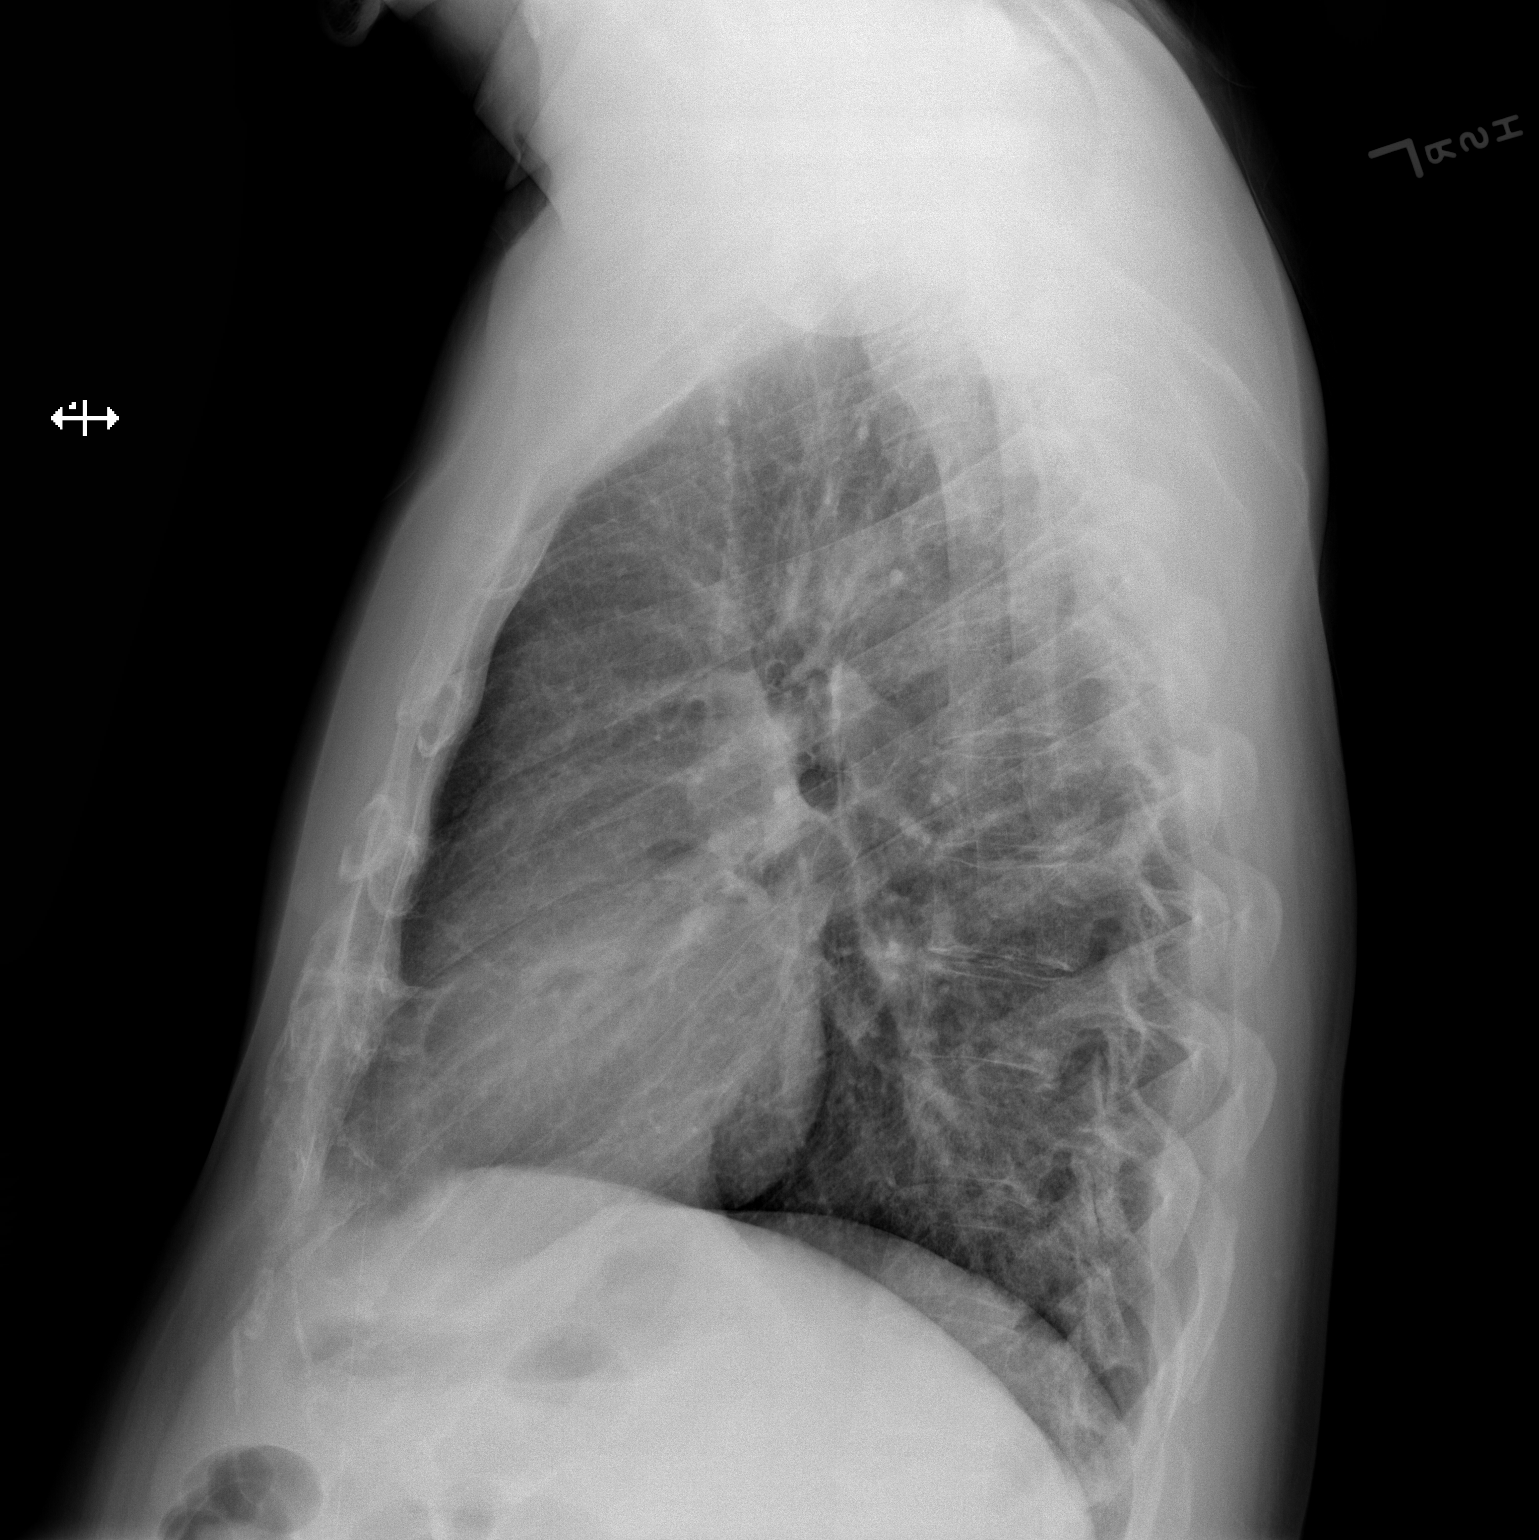

[2 of 2 positions shown; findings below may reference images not displayed]

FINDINGS: Normal heart size, mediastinal contours, and pulmonary vascularity.

RIGHT lower lobe perihilar nodule is not well delineated
radiographically, question 19 x 18 mm. Lungs otherwise clear.

No pulmonary infiltrate, pleural effusion or pneumothorax.

Dextroconvex thoracic scoliosis.
IMPRESSION: Previously identified RIGHT lower lobe nodule is suboptimally
visualized radiographically, question 18 x 19 mm.

## 2020-05-04 IMAGING — DX PORTABLE CHEST - 1 VIEW
1 series · 1 of 1 positions shown · non-contrast
Comparison: 01/21/2019

CLINICAL DATA: Follow-up pneumothorax.

EXAM:
PORTABLE CHEST 1 VIEW

[chest ap]
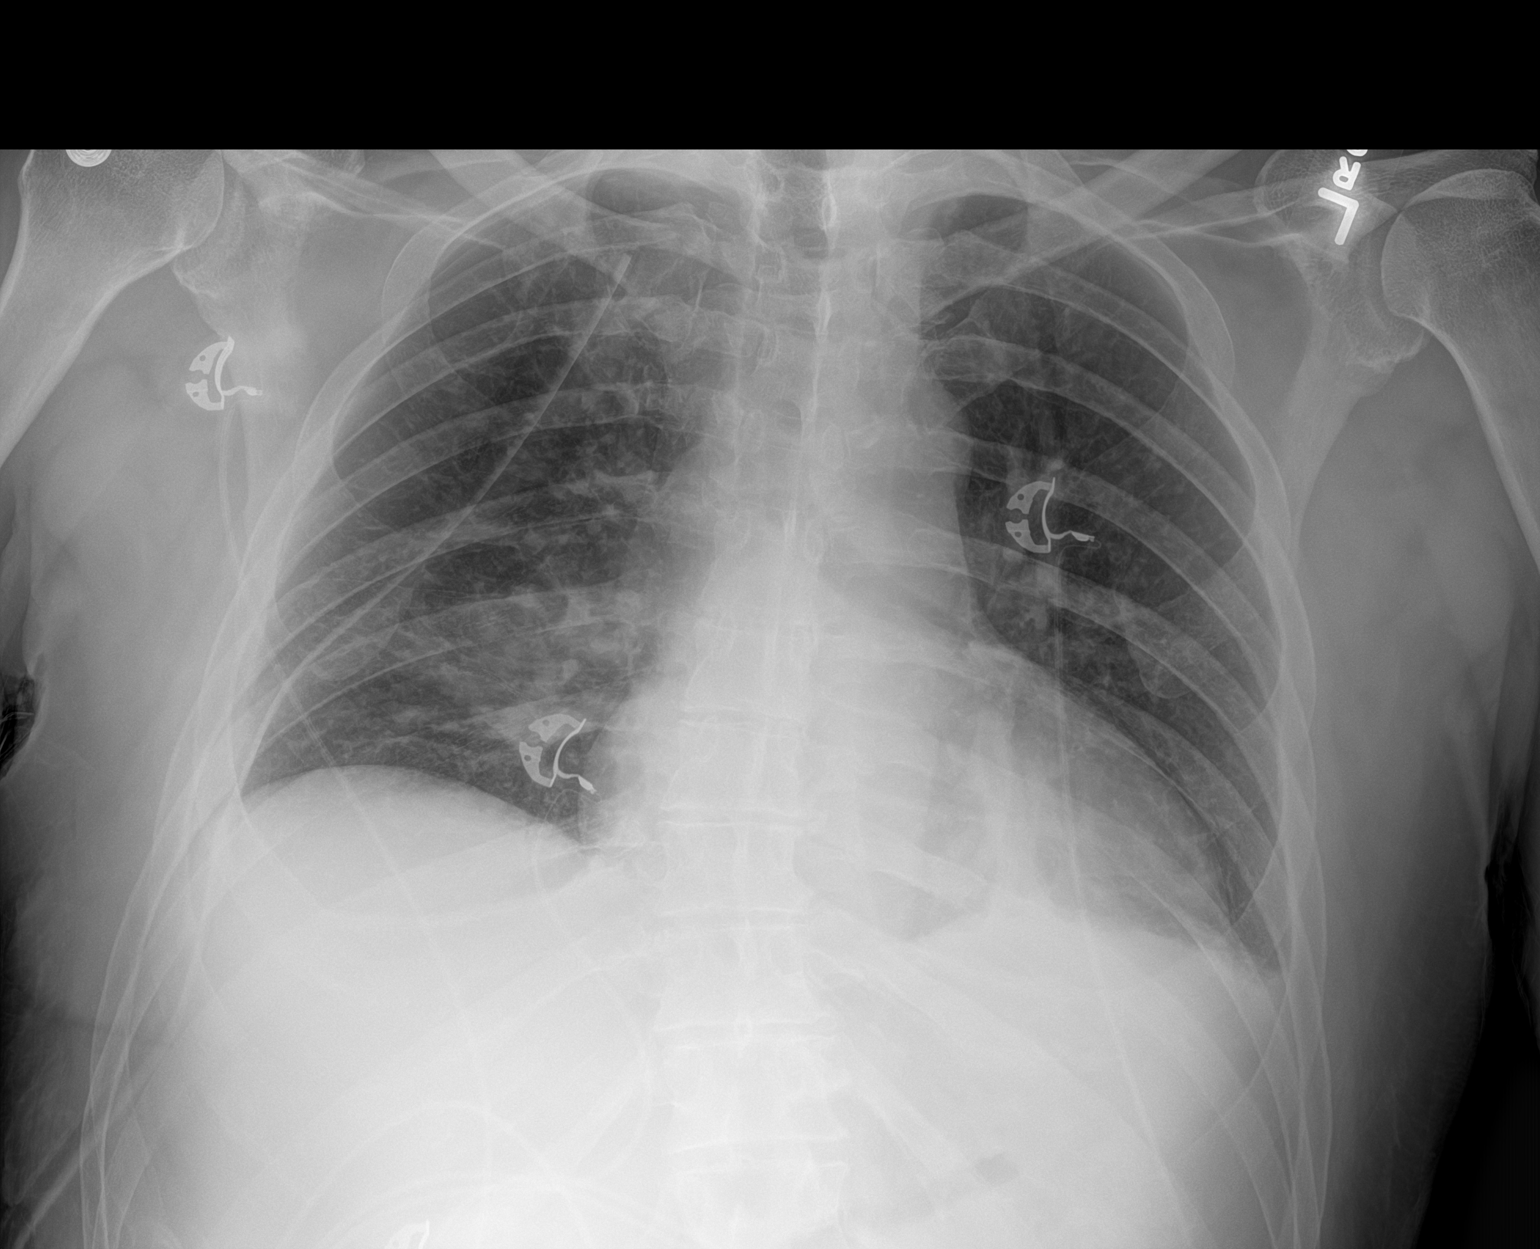

[1 of 1 positions shown; findings below may reference images not displayed]

FINDINGS: The right-sided chest tube is stable. Tiny residual apical
pneumothorax.

Patchy bibasilar infiltrates.  Small pleural effusions.
IMPRESSION: 1. Stable right-sided chest tube with tiny residual apical
pneumothorax.
2. Persistent patchy basilar infiltrates and small effusions.

## 2020-05-05 IMAGING — CR CHEST - 2 VIEW
2 series · 2 of 2 positions shown · non-contrast
Comparison: 01/22/2019

CLINICAL DATA: Chest tube removal.  Follow-up pneumothorax.

EXAM:
CHEST - 2 VIEW

[chest lat]
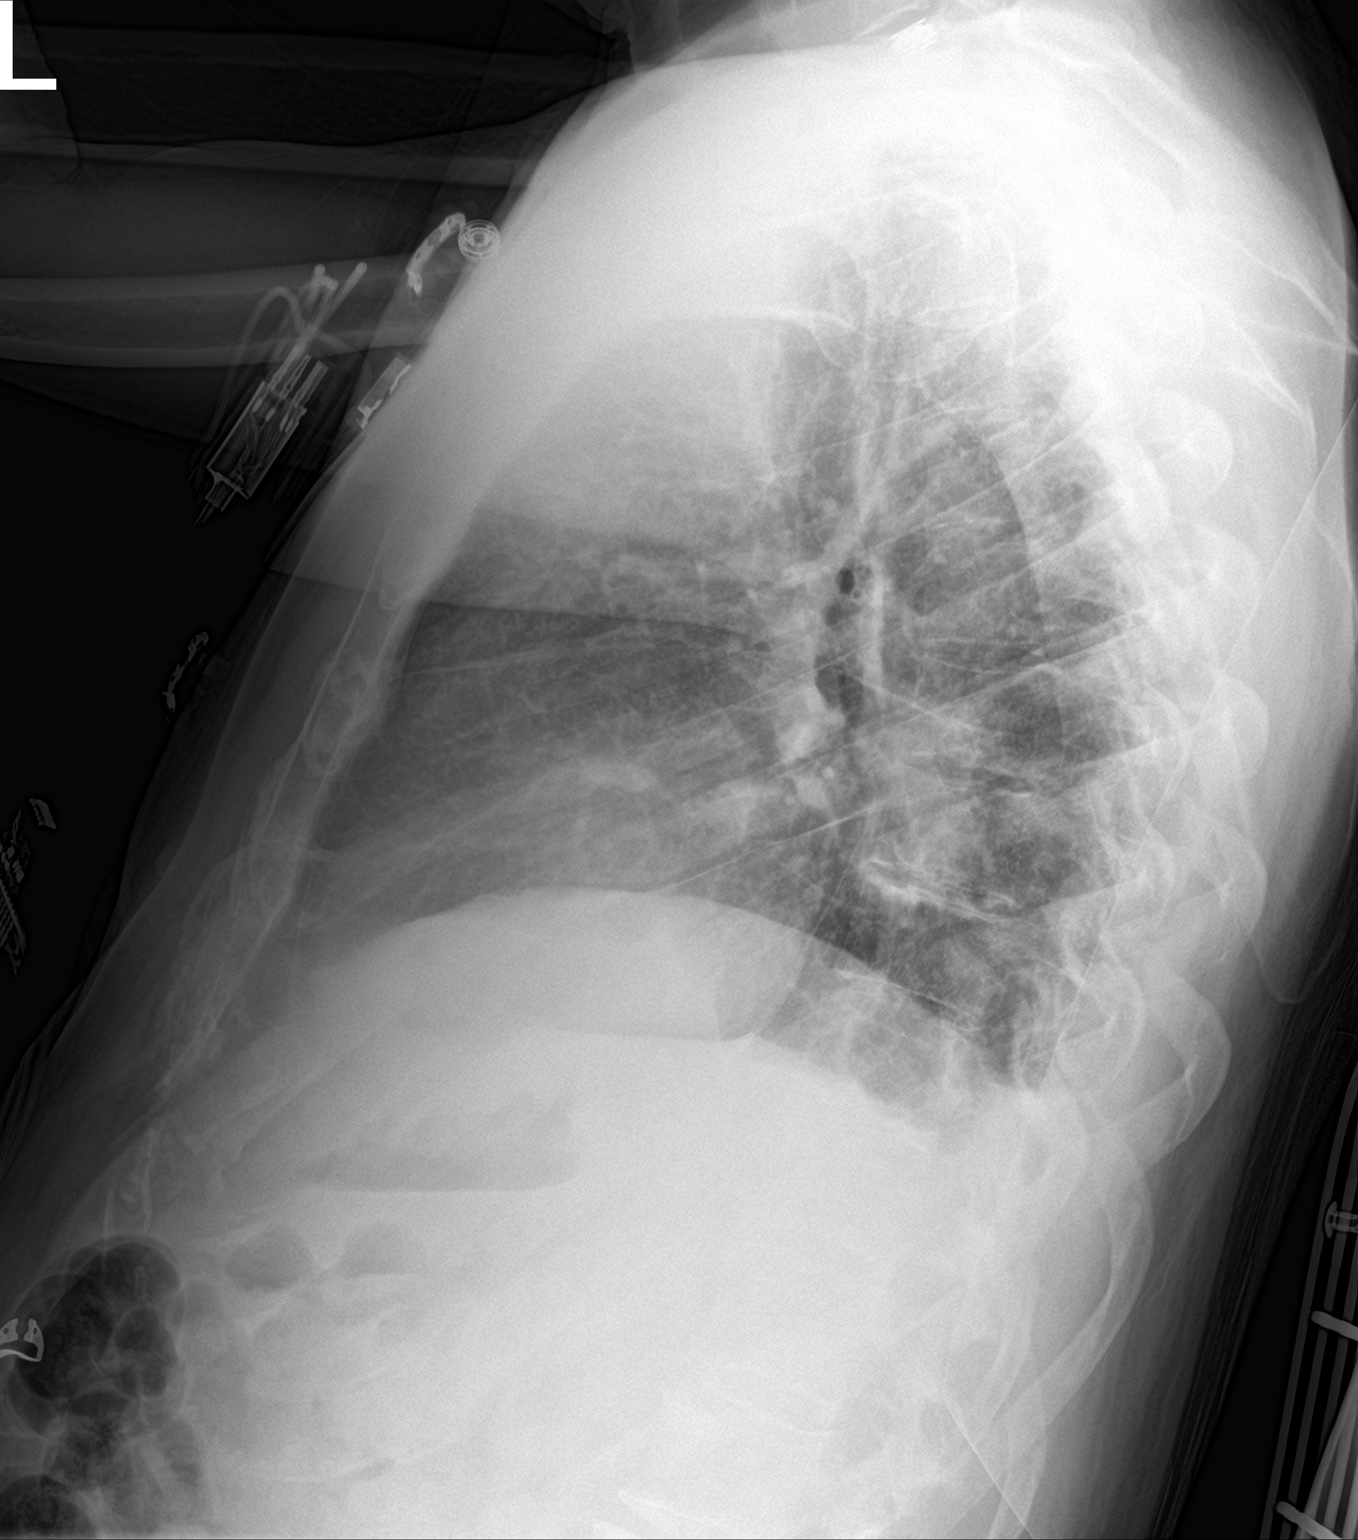

[chest ap]
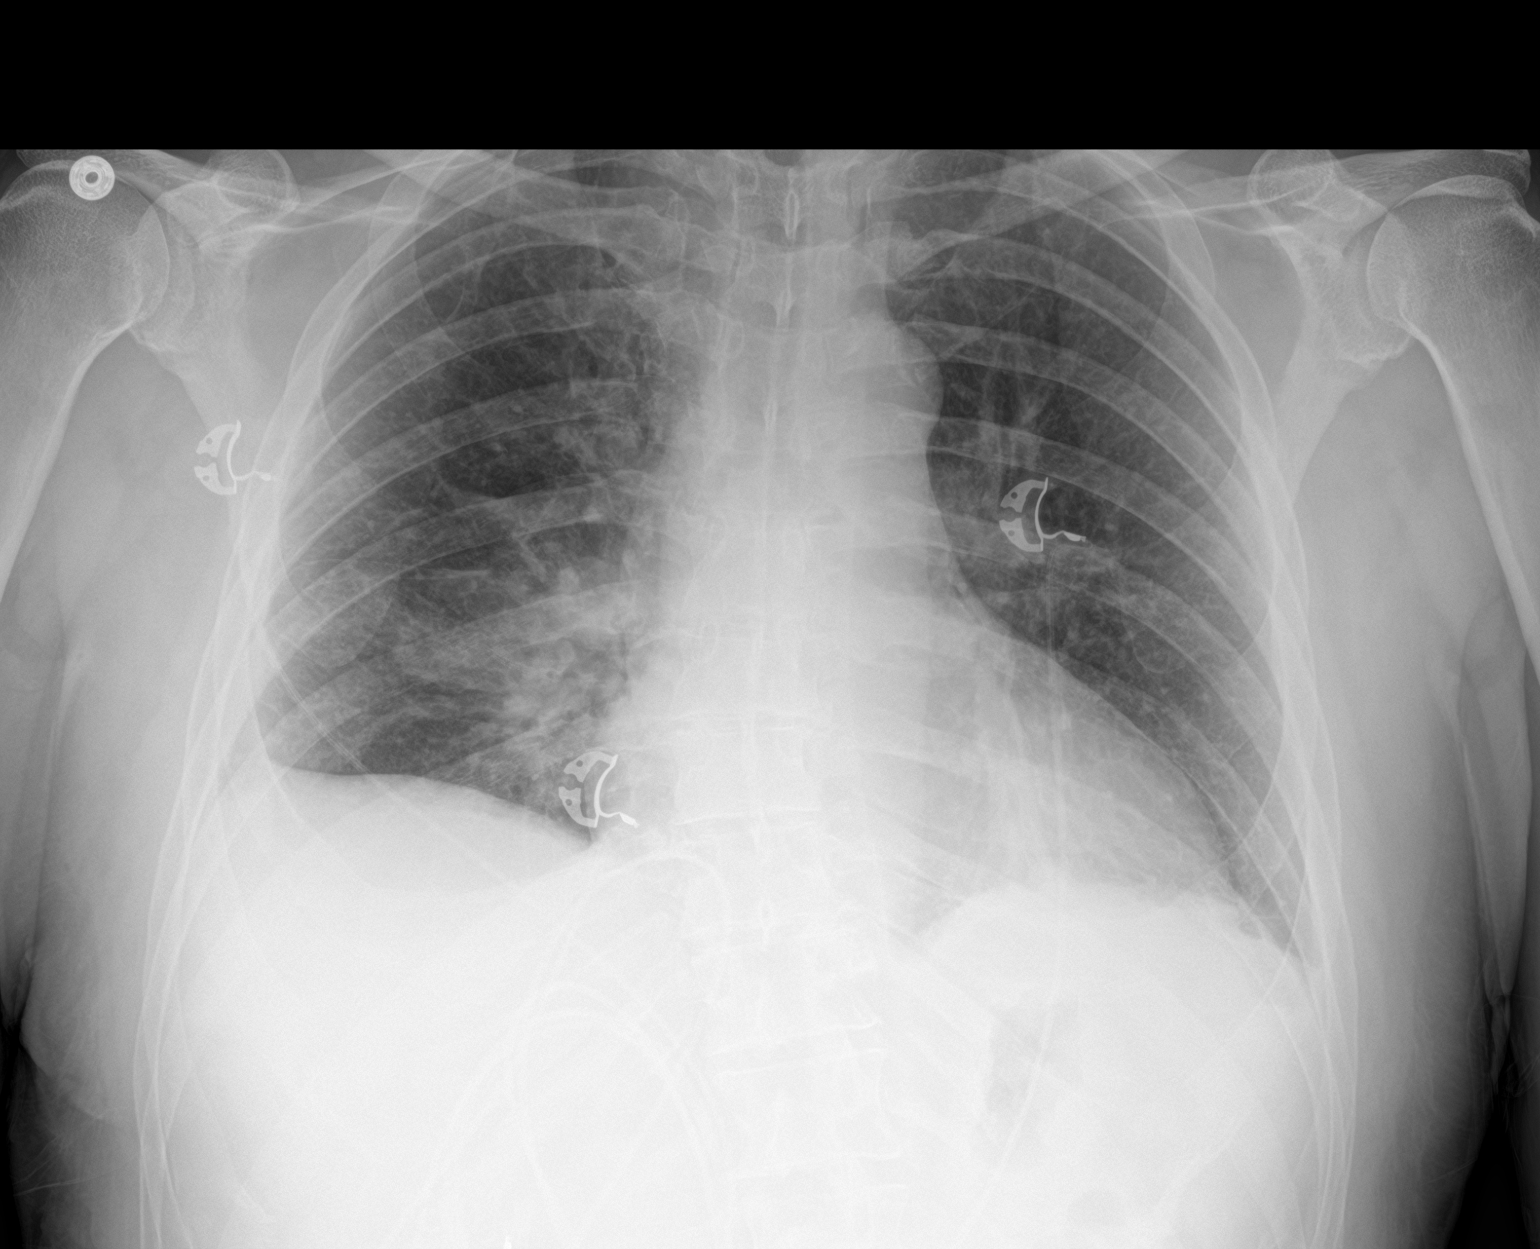

[2 of 2 positions shown; findings below may reference images not displayed]

FINDINGS: RIGHT thoracostomy tube has been removed.

A tiny RIGHT apical pneumothorax has not significantly changed from
01/22/2019.

Bilateral LOWER lung opacities/atelectasis again noted.

Cardiomediastinal silhouette is unchanged.
IMPRESSION: RIGHT thoracostomy tube removal with unchanged tiny RIGHT apical
pneumothorax.

Unchanged bilateral LOWER lung opacities/atelectasis.

## 2020-05-22 ENCOUNTER — Encounter: Payer: Self-pay | Admitting: Dermatology

## 2020-05-22 NOTE — Progress Notes (Signed)
   Follow-Up Visit   Subjective  [name] is a 69 y.o. male who presents for the following: Annual Exam (no concerns).  General skin examination Location:  Duration:  Quality:  Associated Signs/Symptoms: Modifying Factors:  Severity:  Timing: Context:   Objective  Well appearing patient in no apparent distress; mood and affect are within normal limits.  All skin waist up examined.   Assessment & Plan    Skin exam for malignant neoplasm Chest - Medial (Center)  Yearly skin checks  AK (actinic keratosis) (2) Mid Frontal Scalp; Mid Parietal Scalp  Use cream as directed.  Fluorouracil (TOLAK) 4 % CREA - Mid Parietal Scalp  Destruction of lesion - Mid Frontal Scalp, Mid Parietal Scalp Complexity: simple   Destruction method: cryotherapy   Informed consent: discussed and consent obtained   Lesion destroyed using liquid nitrogen: Yes   Cryotherapy cycles:  5 Outcome: patient tolerated procedure well with no complications    Psoriasis Mid Back  Use creams as directed  Ordered Medications: triamcinolone cream (KENALOG) 0.1 % Dermatological Products, Misc. (HPR PLUS) CREA     I, Lavonna Monarch, MD, have reviewed all documentation for this visit.  The documentation on 05/22/20 for the exam, diagnosis, procedures, and orders are all accurate and complete.

## 2020-07-25 ENCOUNTER — Ambulatory Visit: Payer: Self-pay

## 2020-07-25 ENCOUNTER — Encounter: Payer: Self-pay | Admitting: Orthopaedic Surgery

## 2020-07-25 ENCOUNTER — Ambulatory Visit (INDEPENDENT_AMBULATORY_CARE_PROVIDER_SITE_OTHER): Payer: Medicare Other | Admitting: Orthopaedic Surgery

## 2020-07-25 DIAGNOSIS — Z96651 Presence of right artificial knee joint: Secondary | ICD-10-CM

## 2020-07-25 NOTE — Progress Notes (Signed)
The patient is now almost 1 year status post a right total knee arthroplasty.  He is 70 years old very active.  He has no issues with his right knee and says he is doing well with minimal discomfort and good motion and strength.  He denies any significant swelling.  This was performed with a press-fit knee implant.  He walks with a normal-appearing gait.  His right knee shows no significant swelling.  His range of motion is full.  His right knee feels ligamentously stable.  2 views of the right knee show well-seated press-fit arthroplasty with no complicating features.  At this point follow-up can be as needed for his right knee.  He understands that there is any issues or he develops swelling or pain he needs to let us know.  All questions and concerns were answered and addressed.

## 2020-08-23 DIAGNOSIS — H903 Sensorineural hearing loss, bilateral: Secondary | ICD-10-CM | POA: Insufficient documentation

## 2020-09-08 ENCOUNTER — Other Ambulatory Visit: Payer: Self-pay | Admitting: Thoracic Surgery (Cardiothoracic Vascular Surgery)

## 2020-09-08 DIAGNOSIS — D3A090 Benign carcinoid tumor of the bronchus and lung: Secondary | ICD-10-CM

## 2020-10-21 ENCOUNTER — Encounter: Payer: Self-pay | Admitting: Thoracic Surgery (Cardiothoracic Vascular Surgery)

## 2020-10-21 ENCOUNTER — Ambulatory Visit
Admission: RE | Admit: 2020-10-21 | Discharge: 2020-10-21 | Disposition: A | Discharge status: Home / Self Care | Payer: Medicare Other | Source: Ambulatory Visit | Attending: Thoracic Surgery (Cardiothoracic Vascular Surgery) | Admitting: Thoracic Surgery (Cardiothoracic Vascular Surgery)

## 2020-10-21 ENCOUNTER — Ambulatory Visit (INDEPENDENT_AMBULATORY_CARE_PROVIDER_SITE_OTHER): Payer: Medicare Other | Admitting: Thoracic Surgery (Cardiothoracic Vascular Surgery)

## 2020-10-21 ENCOUNTER — Other Ambulatory Visit: Payer: Self-pay

## 2020-10-21 VITALS — BP 150/90 | HR 63 | Resp 20 | Ht 70.0 in | Wt 205.0 lb

## 2020-10-21 DIAGNOSIS — C7A090 Malignant carcinoid tumor of the bronchus and lung: Secondary | ICD-10-CM

## 2020-10-21 DIAGNOSIS — D3A090 Benign carcinoid tumor of the bronchus and lung: Secondary | ICD-10-CM

## 2020-10-21 NOTE — Progress Notes (Signed)
[name] [address]       [name],[place] [id number]             [phone]                    [place] [address] [place] Medical Record #[id number] Date of Birth: 1951-06-19  Referring: [name], MD Primary Care: [name], MD Primary Cardiologist: Minus Breeding, MD  Chief Complaint:    Chief Complaint  Patient presents with  . Lung Cancer    1 year f/u with Chest CT    History of Present Illness:    [name] 70 y.o. male presents for his 1 year follow-up.  He previously underwent a right thoracoscopy with resection of carcinoid tumor.  Planes since his last appointment.    Past Medical History:  Diagnosis Date  . Arthritis    knee, no meds  . Cancer [facility]) 2020   carcinoid tumor of right lung  . 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 Eczema 08/08/2010  . Family history of adverse reaction to anesthesia    mom- post op N/V  . GERD (gastroesophageal reflux disease)    weight loss resolved gerd, no medication  . Hyperlipidemia   . PONV (postoperative nausea and vomiting)   . Sleep apnea    uses CPAP nightly  . Unilateral primary osteoarthritis, right knee 07/30/2019    Past Surgical History:  Procedure Laterality Date  . COLONOSCOPY     hx polyps  . EYE SURGERY     lasik  . INTERCOSTAL NERVE BLOCK  01/20/2019   Procedure: Intercostal Nerve Block;  Surgeon: Grace Isaac, MD;  Location: [place];  Service: Thoracic;;  . KNEE ARTHROSCOPY Right   . SEPTOPLASTY  1980   x 2  . TONSILLECTOMY AND ADENOIDECTOMY  1960  . TOTAL KNEE ARTHROPLASTY Right 07/30/2019  . TOTAL KNEE ARTHROPLASTY Right 07/30/2019   Procedure: RIGHT TOTAL KNEE ARTHROPLASTY;  Surgeon: Mcarthur Rossetti, MD;  Location: [place];  Service: Orthopedics;  Laterality: Right;  Marland Kitchen VIDEO ASSISTED THORACOSCOPY (VATS)/WEDGE RESECTION Right 01/20/2019   Procedure: VIDEO ASSISTED THORACOSCOPY (VATS) WITH RESECTION OF RIGHT INTRAPULMONARY LYMPH NODE;  Surgeon: Grace Isaac, MD;   Location: [place];  Service: Thoracic;  Laterality: Right;  Marland Kitchen VIDEO BRONCHOSCOPY N/A 01/20/2019   Procedure: VIDEO BRONCHOSCOPY;  Surgeon: Grace Isaac, MD;  Location: [facility] OR;  Service: Thoracic;  Laterality: N/A;    Family History  Problem Relation Age of Onset  . Heart disease Mother        CABG age 40s  . Colon cancer Neg Hx   . Stomach cancer Neg Hx      Social History   Tobacco Use  Smoking Status Never Smoker  Smokeless Tobacco Never Used    Social History   Substance and Sexual Activity  Alcohol Use Yes  . Alcohol/week: 10.0 - 14.0 standard drinks  . Types: 10 - 14 Glasses of wine per week   Comment: social     Allergies  Allergen Reactions  . Codeine Other (See Comments)    headache    Current Outpatient Medications  Medication Sig Dispense Refill  . Dermatological Products, Misc. (HPR PLUS) CREA Apply 1 application topically at bedtime. 450 g 1  . Fluorouracil (TOLAK) 4 % CREA Apply 1 application topically at bedtime. 40 g 0  . ibuprofen (ADVIL) 200 MG tablet Take 200 mg by mouth every 8 (eight) hours as  needed. Takes 2- 3 tabs    . Milk Thistle 1000 MG CAPS Take 1,000 mg by mouth daily.     . rosuvastatin (CRESTOR) 10 MG tablet Take 10 mg by mouth daily in the afternoon.     . triamcinolone cream (KENALOG) 0.1 % Apply 1 application topically daily. 453 g 1   No current facility-administered medications for this visit.    Review of Systems  Constitutional: Negative.   Musculoskeletal: Negative.   Neurological: Negative.     PHYSICAL EXAMINATION: BP (!) 150/90   Pulse 63   Resp 20   Ht 5\' 10"  (1.778 m)   Wt 205 lb (93 kg)   SpO2 97% Comment: RA  BMI 29.41 kg/m   Physical Exam Constitutional:      General: He is not in acute distress.    Appearance: Normal appearance. He is normal weight. He is not ill-appearing.  HENT:     Head: Normocephalic and atraumatic.  Eyes:     Extraocular Movements: Extraocular movements intact.   Cardiovascular:     Rate and Rhythm: Normal rate.  Pulmonary:     Effort: Pulmonary effort is normal. No respiratory distress.  Abdominal:     General: Abdomen is flat. There is no distension.  Skin:    General: Skin is warm and dry.  Neurological:     General: No focal deficit present.     Mental Status: He is alert and oriented to person, place, and time.      Diagnostic Studies & Laboratory data:     Recent Radiology Findings:   CT Chest Wo Contrast  Result Date: 10/21/2020 CLINICAL DATA:  Status post wedge resection for right lower lobe pulmonary carcinoid 01/20/2019. Restaging. EXAM: CT CHEST WITHOUT CONTRAST TECHNIQUE: Multidetector CT imaging of the chest was performed following the standard protocol without IV contrast. COMPARISON:  09/24/2019 chest CT. FINDINGS: Cardiovascular: Normal heart size. No significant pericardial effusion/thickening. Three-vessel coronary atherosclerosis. Atherosclerotic nonaneurysmal thoracic aorta. Top-normal caliber main pulmonary artery (3.3 cm diameter). Mediastinum/Nodes: No discrete thyroid nodules. Unremarkable esophagus. No pathologically enlarged axillary, mediastinal or hilar lymph nodes, noting limited sensitivity for the detection of hilar adenopathy on this noncontrast study. Lungs/Pleura: No pneumothorax. No pleural effusion. No acute consolidative airspace disease, lung masses or significant pulmonary nodules. Stable postsurgical changes from anterior right lower lobe wedge resection with stable mild curvilinear scar in the peripheral right mid lung. Upper abdomen: Small hiatal hernia. Musculoskeletal: No aggressive appearing focal osseous lesions. Symmetric mild bilateral gynecomastia, stable. Mild thoracic spondylosis. IMPRESSION: 1. No evidence of local tumor recurrence status post right lower lobe wedge resection. 2. No evidence of metastatic disease in the chest. 3. Three-vessel coronary atherosclerosis. 4. Small hiatal hernia. 5. Aortic  Atherosclerosis (ICD10-I70.0). Electronically Signed   By: Ilona Sorrel M.D.   On: 10/21/2020 09:37       I have independently reviewed the above radiology studies  and reviewed the findings with the patient.   Recent Lab Findings: Lab Results  Component Value Date   WBC 7.8 07/31/2019   HGB 11.7 (L) 07/31/2019   HCT 35.1 (L) 07/31/2019   PLT 179 07/31/2019   GLUCOSE 160 (H) 07/31/2019   ALT 15 01/22/2019   AST 18 01/22/2019   NA 139 07/31/2019   K 4.0 07/31/2019   CL 102 07/31/2019   CREATININE 0.83 07/31/2019   BUN 16 07/31/2019   CO2 26 07/31/2019   INR 1.0 01/19/2019         Assessment /  Plan:   70 year old male status post resection of a right lower lobe carcinoid tumor July 2020.  I personally reviewed his cross-sectional imaging.  There is no evidence of recurrence.  His lung fields appear clear.  He will require a repeat CT chest in 1 year.  I will see him as a virtual visit at that point.      Lajuana Matte 10/21/2020 1:47 PM

## 2021-01-18 ENCOUNTER — Other Ambulatory Visit: Payer: Self-pay | Admitting: Internal Medicine

## 2021-01-18 DIAGNOSIS — R519 Headache, unspecified: Secondary | ICD-10-CM

## 2021-02-05 ENCOUNTER — Ambulatory Visit
Admission: RE | Admit: 2021-02-05 | Discharge: 2021-02-05 | Disposition: A | Discharge status: Home / Self Care | Payer: Medicare Other | Source: Ambulatory Visit | Attending: Internal Medicine | Admitting: Internal Medicine

## 2021-02-05 DIAGNOSIS — R519 Headache, unspecified: Secondary | ICD-10-CM

## 2021-02-05 MED ORDER — GADOBENATE DIMEGLUMINE 529 MG/ML IV SOLN
20.0000 mL | Freq: Once | INTRAVENOUS | Status: AC | PRN
  Administered 2021-02-05: 20 mL via INTRAVENOUS

## 2021-02-15 ENCOUNTER — Other Ambulatory Visit: Payer: Self-pay

## 2021-02-15 ENCOUNTER — Ambulatory Visit (INDEPENDENT_AMBULATORY_CARE_PROVIDER_SITE_OTHER): Payer: Medicare Other | Admitting: Dermatology

## 2021-02-15 DIAGNOSIS — L570 Actinic keratosis: Secondary | ICD-10-CM | POA: Diagnosis not present

## 2021-02-15 DIAGNOSIS — Z1283 Encounter for screening for malignant neoplasm of skin: Secondary | ICD-10-CM

## 2021-02-15 DIAGNOSIS — L720 Epidermal cyst: Secondary | ICD-10-CM

## 2021-02-15 DIAGNOSIS — L2089 Other atopic dermatitis: Secondary | ICD-10-CM

## 2021-02-15 DIAGNOSIS — L409 Psoriasis, unspecified: Secondary | ICD-10-CM

## 2021-02-15 MED ORDER — HPR PLUS EX CREA: 1.0000 "application " | TOPICAL_CREAM | Freq: Every evening | CUTANEOUS | 8 refills | Status: DC

## 2021-02-15 MED ORDER — TOLAK 4 % EX CREA: 1.0000 "application " | TOPICAL_CREAM | Freq: Every evening | CUTANEOUS | 0 refills | Status: DC

## 2021-02-15 MED ORDER — TRIAMCINOLONE ACETONIDE 0.1 % EX CREA: 1.0000 "application " | TOPICAL_CREAM | Freq: Every day | CUTANEOUS | 1 refills | Status: AC

## 2021-02-27 ENCOUNTER — Encounter: Payer: Self-pay | Admitting: Dermatology

## 2021-02-27 NOTE — Progress Notes (Signed)
   Follow-Up Visit   Subjective  [name] is a 70 y.o. male who presents for the following: Annual Exam (Here for annual skin exam. No concerns. Patient does has lesion on shoulder he thinks is a cyst. No history of skin cancers. ).  General skin examination, check spot on back of left shoulder Location:  Duration:  Quality:  Associated Signs/Symptoms: Modifying Factors:  Severity:  Timing: Context:   Objective  Well appearing patient in no apparent distress; mood and affect are within normal limits. Torso - Posterior (Back) Full body skin examination: No atypical pigmented lesions or nonmelanoma skin cancer  Left Dorsal Hand, Left Forehead Gritty pink 4 mm crusts with multiple small actinic keratoses  Left Upper Shoulder I agree with patient's opinion that this is a small noninflamed epidermoid cyst on his upper left back.  Mid Back More itching than visible rash so this could either be an eczematous process or a neurogenic itch.    A full examination was performed including scalp, head, eyes, ears, nose, lips, neck, chest, axillae, abdomen, back, buttocks, bilateral upper extremities, bilateral lower extremities, hands, feet, fingers, toes, fingernails, and toenails. All findings within normal limits unless otherwise noted below.   Assessment & Plan    Encounter for screening for malignant neoplasm of skin Torso - Posterior (Back)  Annual skin examination.  Encouraged to self examine twice annually.  Continue ultraviolet protection.  AK (actinic keratosis) (2) Left Dorsal Hand; Left Forehead  We will initially used topical fluorouracil (Tolak) in late fall or winter and return if any spots fail to clear  Fluorouracil (TOLAK) 4 % CREA - Left Dorsal Hand, Left Forehead Apply 1 application topically at bedtime.  Epidermal cyst Left Upper Shoulder  I discussed elective removal.  For now, no intervention planned.  Other atopic dermatitis Mid Back  He  can use Kenalog topically daily after bathing for the area.  Call me if this gets worse.  triamcinolone cream (KENALOG) 0.1 % - Mid Back Apply 1 application topically daily.  Dermatological Products, Misc. (HPR PLUS) CREA - Mid Back Apply 1 application topically at bedtime.  Psoriasis      I, Lavonna Monarch, MD, have reviewed all documentation for this visit.  The documentation on 02/27/21 for the exam, diagnosis, procedures, and orders are all accurate and complete.

## 2021-03-23 ENCOUNTER — Ambulatory Visit (INDEPENDENT_AMBULATORY_CARE_PROVIDER_SITE_OTHER): Payer: Medicare Other | Admitting: Neurology

## 2021-03-23 ENCOUNTER — Encounter: Payer: Self-pay | Admitting: Neurology

## 2021-03-23 VITALS — BP 157/72 | HR 78 | Ht 70.0 in | Wt 216.0 lb

## 2021-03-23 DIAGNOSIS — G44209 Tension-type headache, unspecified, not intractable: Secondary | ICD-10-CM

## 2021-03-23 MED ORDER — SUMATRIPTAN SUCCINATE 50 MG PO TABS: ORAL_TABLET | ORAL | 6 refills | Status: DC

## 2021-03-23 MED ORDER — PROPRANOLOL HCL 40 MG PO TABS: 40.0000 mg | ORAL_TABLET | Freq: Two times a day (BID) | ORAL | 11 refills | Status: DC

## 2021-03-23 MED ORDER — PROPRANOLOL HCL 40 MG PO TABS: 40.0000 mg | ORAL_TABLET | Freq: Three times a day (TID) | ORAL | 11 refills | Status: DC

## 2021-03-23 NOTE — Progress Notes (Signed)
Chief Complaint  Patient presents with   Headache    New patient: paper referral:  headaches Room 13, alone in room      ASSESSMENT AND PLAN  [name] is a 70 y.o. male   New onset headaches,  Often started from upper nuchal area, movement worsening headache,  Normal MRI of the brain, neurological examinations,  After discussed with patient, will try preventive medication propanolol 40 mg twice a day,  Low-dose Imitrex 50 mg as needed for moderate to severe headaches,  ESR C-reactive protein to rule out temporal arteritis    DIAGNOSTIC DATA (LABS, IMAGING, TESTING) - I reviewed patient records, labs, notes, testing and imaging myself where available.   MEDICAL HISTORY:  [name] is a 70 year old male, seen in request by his primary care physician Dr. [name], for evaluation of occipital area headache initial evaluation was March 23, 2021  I reviewed and summarized the referring note. PMHx Right Lung Carcinoid tumor. OSA-CPAP.  Since March 2022, without clear triggers, he began to have frequent pressure headache, often started around noon, spreading forward, behind mild retro-orbital area temporal region pressure headache, lasting till nighttime, improved after he lie down, overnight sleep,  He denies lateralized motor or sensory deficit, tried Tylenol did not help, Advil helps some,  Denies a previous history of headache, denies gait abnormality, denies radiating pain to bilateral shoulder, no upper extremity paresthesia or weakness  There is no limitation in his daily activity, he is able to continue to play golf without much of the difficulty, but sudden jolt of movement, such as cough, will trigger a moment of intense headache, only lasting few seconds,  I personally reviewed MRI of the brain with without contrast February 06, 2021, mild age-related changes, no acute abnormality, no evidence of significant cervical degenerative changes on the  visible upper cervical region, no canal stenosis,  Because has history of obstructive sleep apnea, compliant with his CPAP machine, reported good sleep quality  PHYSICAL EXAM:   Vitals:   03/23/21 1156  BP: (!) 157/72  Pulse: 78  Weight: 216 lb (98 kg)  Height: $Remove'5\' 10"'PsSbNxU$  (1.778 m)   Not recorded     Body mass index is 30.99 kg/m.  PHYSICAL EXAMNIATION:  Gen: NAD, conversant, well nourised, well groomed                     Cardiovascular: Regular rate rhythm, no peripheral edema, warm, nontender. Eyes: Conjunctivae clear without exudates or hemorrhage Neck: Supple, no carotid bruits. Pulmonary: Clear to auscultation bilaterally   NEUROLOGICAL EXAM:  MENTAL STATUS: Speech:    Speech is normal; fluent and spontaneous with normal comprehension.  Cognition:     Orientation to time, place and person     Normal recent and remote memory     Normal Attention span and concentration     Normal Language, naming, repeating,spontaneous speech     Fund of knowledge   CRANIAL NERVES: CN II: Visual fields are full to confrontation. Pupils are round equal and briskly reactive to light. CN III, IV, VI: extraocular movement are normal. No ptosis. CN V: Facial sensation is intact to light touch CN VII: Face is symmetric with normal eye closure  CN VIII: Hearing is normal to causal conversation. CN IX, X: Phonation is normal. CN XI: Head turning and shoulder shrug are intact  MOTOR: There is no pronator drift of out-stretched arms. Muscle bulk and tone are normal. Muscle strength is normal.  REFLEXES: Reflexes are 2+ and symmetric at the biceps, triceps, knees, and ankles. Plantar responses are flexor.  SENSORY: Intact to light touch, pinprick and vibratory sensation are intact in fingers and toes.  COORDINATION: There is no trunk or limb dysmetria noted.  GAIT/STANCE: Posture is normal. Gait is steady with normal steps, base, arm swing, and turning. Heel and toe walking are  normal. Tandem gait is normal.  Romberg is absent.  REVIEW OF SYSTEMS:  Full 14 system review of systems performed and notable only for as above All other review of systems were negative.   ALLERGIES: Allergies  Allergen Reactions   Codeine Other (See Comments)    headache    HOME MEDICATIONS: Current Outpatient Medications  Medication Sig Dispense Refill   Dermatological Products, Misc. (HPR PLUS) CREA Apply 1 application topically at bedtime. 450 g 8   ibuprofen (ADVIL) 200 MG tablet Take 200 mg by mouth every 8 (eight) hours as needed. Takes 2- 3 tabs     Milk Thistle 1000 MG CAPS Take 1,000 mg by mouth daily.      rosuvastatin (CRESTOR) 10 MG tablet Take 10 mg by mouth daily in the afternoon.      triamcinolone cream (KENALOG) 0.1 % Apply 1 application topically daily. 454 g 1   No current facility-administered medications for this visit.    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   Eczema 08/08/2010   Family history of adverse reaction to anesthesia    mom- post op N/V   GERD (gastroesophageal reflux disease)    weight loss resolved gerd, no medication   Hyperlipidemia    PONV (postoperative nausea and vomiting)    Sleep apnea    uses CPAP nightly   Unilateral primary osteoarthritis, right knee 07/30/2019    PAST SURGICAL HISTORY: Past Surgical History:  Procedure Laterality Date   COLONOSCOPY     hx polyps   EYE SURGERY     lasik   INTERCOSTAL NERVE BLOCK  01/20/2019   Procedure: Intercostal Nerve Block;  Surgeon: Delight Ovens, MD;  Location: [facility] OR;  Service: Thoracic;;   KNEE ARTHROSCOPY Right    SEPTOPLASTY  1980   x 2   TONSILLECTOMY AND ADENOIDECTOMY  1960   TOTAL KNEE ARTHROPLASTY Right 07/30/2019   TOTAL KNEE ARTHROPLASTY Right 07/30/2019   Procedure: RIGHT TOTAL KNEE ARTHROPLASTY;  Surgeon: Kathryne Hitch, MD;  Location: [facility] OR;  Service: Orthopedics;  Laterality: Right;   VIDEO ASSISTED THORACOSCOPY (VATS)/WEDGE RESECTION Right 01/20/2019   Procedure: VIDEO ASSISTED THORACOSCOPY (VATS) WITH RESECTION OF RIGHT INTRAPULMONARY LYMPH NODE;  Surgeon: Delight Ovens, MD;  Location: [facility] OR;  Service: Thoracic;  Laterality: Right;   VIDEO BRONCHOSCOPY N/A 01/20/2019   Procedure: VIDEO BRONCHOSCOPY;  Surgeon: Delight Ovens, MD;  Location: [facility] OR;  Service: Thoracic;  Laterality: N/A;    FAMILY HISTORY: Family History  Problem Relation Age of Onset   Heart disease Mother        CABG age 65s   Colon cancer Neg Hx    Stomach cancer Neg Hx     SOCIAL HISTORY: Social History   Socioeconomic History   Marital status: Married    Spouse name: Not on file   Number of children: Not on file   Years of education: Not on file  Highest education level: Not on file  Occupational History   Not on file  Tobacco Use   Smoking status: Never   Smokeless tobacco: Never  Vaping Use   Vaping Use: Never used  Substance and Sexual Activity   Alcohol use: Yes    Alcohol/week: 10.0 - 14.0 standard drinks    Types: 10 - 14 Glasses of wine per week    Comment: social   Drug use: No   Sexual activity: Not on file  Other Topics Concern   Not on file  Social History Narrative   Married, lives with wife.    Right Handed   Drinks hot tea   Social Determinants of Health   Financial Resource Strain: Not on file  Food Insecurity: Not on file  Transportation Needs: Not on file  Physical Activity: Not on file  Stress: Not on file  Social Connections: Not on file  Intimate Partner Violence: Not on file      Marcial Pacas, M.D. Ph.D.  [facility] [address], [place], [place] [address] Ph: [phone] Fax: [phone]  CC:  [name], MD [place],  [place] [phone]  [name], MD  ,,

## 2021-03-24 ENCOUNTER — Encounter: Payer: Self-pay | Admitting: Neurology

## 2021-03-24 LAB — C-REACTIVE PROTEIN: CRP: 7 mg/L (ref 0–10)

## 2021-03-24 LAB — SEDIMENTATION RATE: Sed Rate: 4 mm/hr (ref 0–30)

## 2021-04-28 ENCOUNTER — Ambulatory Visit: Payer: Medicare Other | Admitting: Psychiatry

## 2021-07-13 ENCOUNTER — Encounter: Payer: Self-pay | Admitting: Neurology

## 2021-07-13 ENCOUNTER — Ambulatory Visit (INDEPENDENT_AMBULATORY_CARE_PROVIDER_SITE_OTHER): Payer: Medicare Other | Admitting: Neurology

## 2021-07-13 VITALS — BP 122/72 | HR 51 | Ht 70.0 in | Wt 221.0 lb

## 2021-07-13 DIAGNOSIS — R51 Headache with orthostatic component, not elsewhere classified: Secondary | ICD-10-CM | POA: Diagnosis not present

## 2021-07-13 MED ORDER — BUTALBITAL-APAP-CAFFEINE 50-325-40 MG PO TABS: 1.0000 | ORAL_TABLET | Freq: Four times a day (QID) | ORAL | 5 refills | Status: DC | PRN

## 2021-07-13 NOTE — Progress Notes (Signed)
Chief Complaint  Patient presents with   Follow-up    Rm 15. Alone. PCP is Dr. [name]. Pt states headaches are better. After Christmas he became sick and was given Zpak and Prednisone, one of which helped greatly with his headaches. Propanolol is somewhat helpful. He has taken Sumatriptan 2-3 times which gives some relief.      ASSESSMENT AND PLAN  [name] is a 71 y.o. male   New onset headaches, Clearly described positional related  Often started from upper nuchal area, movement and cough worsening headache,  Normal MRI of the brain with without contrast, February 06, 2021,  Inderal 40 mg twice a day tolerating well, only mild improvement,  Significant improvement since the end of December 2022 following Z-Pak and prednisone tapering  Tried Imitrex 50 mg as needed few times, moderate improvement,  There is clearly positional component, worsening headache in upright position, quick improvement lying flat, suggestive of intracranial low pressure related headaches, I have suggested him to increase caffeine containing beverage, maintain at least 64 ounce of water daily,  Try Fioricet as needed  If he continue complains of frequent headaches, may call office, consider repeat MRI of the brain with without contrast again, even cervical and thoracic spine to look for potential meningeal fistula    DIAGNOSTIC DATA (LABS, IMAGING, TESTING) - I reviewed patient records, labs, notes, testing and imaging myself where available.   MEDICAL HISTORY:  [name] is a 71 year old male, seen in request by his primary care physician Dr. [name], for evaluation of occipital area headache initial evaluation was March 23, 2021  I reviewed and summarized the referring note. PMHx Right Lung Carcinoid tumor. OSA-CPAP.  Since March 2022, without clear triggers, he began to have frequent pressure headache, often started around noon, spreading forward, behind mild  retro-orbital area temporal region pressure headache, lasting till nighttime, improved after he lie down, overnight sleep,  He denies lateralized motor or sensory deficit, tried Tylenol did not help, Advil helps some,  Denies a previous history of headache, denies gait abnormality, denies radiating pain to bilateral shoulder, no upper extremity paresthesia or weakness  There is no limitation in his daily activity, he is able to continue to play golf without much of the difficulty, but sudden jolt of movement, such as cough, will trigger a moment of intense headache, only lasting few seconds,  I personally reviewed MRI of the brain with without contrast February 06, 2021, mild age-related changes, no acute abnormality, no evidence of significant cervical degenerative changes on the visible upper cervical region, no canal stenosis,  Because has history of obstructive sleep apnea, compliant with his CPAP machine, reported good sleep quality  UPDATE Jul 13 2021: He continues to complain of positional headaches since last visit in September, very good in the morning, gradually onset of headache in the afternoon, building up at the end of the day, improvement after overnight sleep, starting from occipital area, centered at the vertex region, pressure pain,  He did try Imitrex 50 mg as needed couple times, seems to help his headaches, he tolerated Inderal 40 mg twice a day without significant side effect  He suffered a chest cold, cough around Christmas time, significant headache with cough, become unbearable, was treated with Z-Pak, tapering dose of prednisone starting of 60 mg daily, just finished at the beginning of 2023, he reported significant improvement of his symptoms afterwards, has not had headache over the past 2 to 3 weeks  He  denies any trigger before the gradual onset of headache since March 2022  PHYSICAL EXAM:   Vitals:   07/13/21 1129  BP: 122/72  Pulse: (!) 51  Weight: 221 lb  (100.2 kg)  Height: 5\' 10"  (1.778 m)   Not recorded     Body mass index is 31.71 kg/m.  PHYSICAL EXAMNIATION:  Gen: NAD, conversant, well nourised, well groomed                     Cardiovascular: Regular rate rhythm, no peripheral edema, warm, nontender. Eyes: Conjunctivae clear without exudates or hemorrhage Neck: Supple, no carotid bruits. Pulmonary: Clear to auscultation bilaterally   NEUROLOGICAL EXAM:  MENTAL STATUS: Speech/cognition:  awake, alert, oriented to history taking and casual conversation  CRANIAL NERVES: CN II: Visual fields are full to confrontation. Pupils are round equal and briskly reactive to light. CN III, IV, VI: extraocular movement are normal. No ptosis. CN V: Facial sensation is intact to light touch CN VII: Face is symmetric with normal eye closure  CN VIII: Hearing is normal to causal conversation. CN IX, X: Phonation is normal. CN XI: Head turning and shoulder shrug are intact  MOTOR: There is no pronator drift of out-stretched arms. Muscle bulk and tone are normal. Muscle strength is normal.  REFLEXES: Reflexes are 2+ and symmetric at the biceps, triceps, knees, and ankles. Plantar responses are flexor.  SENSORY: Intact to light touch, pinprick and vibratory sensation are intact in fingers and toes.  COORDINATION: There is no trunk or limb dysmetria noted.  GAIT/STANCE: Posture is normal. Gait is steady with normal steps, base, arm swing, and turning. Heel and toe walking are normal. Tandem gait is normal.  Romberg is absent.  REVIEW OF SYSTEMS:  Full 14 system review of systems performed and notable only for as above All other review of systems were negative.   ALLERGIES: Allergies  Allergen Reactions   Codeine Other (See Comments)    headache   Other     Other reaction(s): unable to sleep- Steroids    HOME MEDICATIONS: Current Outpatient Medications  Medication Sig Dispense Refill   Dermatological Products, Misc. (HPR  PLUS) CREA Apply 1 application topically at bedtime. 450 g 8   ibuprofen (ADVIL) 200 MG tablet Take 200 mg by mouth every 8 (eight) hours as needed. Takes 2- 3 tabs     Milk Thistle 1000 MG CAPS Take 1,000 mg by mouth daily.      propranolol (INDERAL) 40 MG tablet Take 1 tablet (40 mg total) by mouth 2 (two) times daily. 60 tablet 11   rosuvastatin (CRESTOR) 10 MG tablet Take 10 mg by mouth daily in the afternoon.      SUMAtriptan (IMITREX) 50 MG tablet May repeat in 2 hours if headache persists or recurs. 12 tablet 6   triamcinolone cream (KENALOG) 0.1 % Apply 1 application topically daily. 454 g 1   No current facility-administered medications for this visit.    PAST MEDICAL HISTORY: Past Medical History:  Diagnosis Date   Arthritis    knee, no meds   Cancer ([facility]) 2020   carcinoid tumor of right lung   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   Eczema 08/08/2010   Family history of adverse reaction to anesthesia    mom- post op N/V   GERD (gastroesophageal reflux disease)    weight loss resolved gerd, no medication   Hyperlipidemia    PONV (postoperative nausea and vomiting)    Sleep apnea  uses CPAP nightly   Unilateral primary osteoarthritis, right knee 07/30/2019    PAST SURGICAL HISTORY: Past Surgical History:  Procedure Laterality Date   COLONOSCOPY     hx polyps   EYE SURGERY     lasik   INTERCOSTAL NERVE BLOCK  01/20/2019   Procedure: Intercostal Nerve Block;  Surgeon: Grace Isaac, MD;  Location: [place];  Service: Thoracic;;   KNEE ARTHROSCOPY Right    SEPTOPLASTY  1980   x 2   TONSILLECTOMY AND ADENOIDECTOMY  1960   TOTAL KNEE ARTHROPLASTY Right 07/30/2019   TOTAL KNEE ARTHROPLASTY Right 07/30/2019   Procedure: RIGHT TOTAL KNEE ARTHROPLASTY;  Surgeon: Mcarthur Rossetti, MD;  Location: [facility];  Service: Orthopedics;  Laterality: Right;   VIDEO ASSISTED THORACOSCOPY (VATS)/WEDGE RESECTION Right 01/20/2019   Procedure: VIDEO ASSISTED THORACOSCOPY (VATS) WITH  RESECTION OF RIGHT INTRAPULMONARY LYMPH NODE;  Surgeon: Grace Isaac, MD;  Location: [facility];  Service: Thoracic;  Laterality: Right;   VIDEO BRONCHOSCOPY N/A 01/20/2019   Procedure: VIDEO BRONCHOSCOPY;  Surgeon: Grace Isaac, MD;  Location: [facility];  Service: Thoracic;  Laterality: N/A;    FAMILY HISTORY: Family History  Problem Relation Age of Onset   Heart disease Mother        CABG age 50s   Colon cancer Neg Hx    Stomach cancer Neg Hx     SOCIAL HISTORY: Social History   Socioeconomic History   Marital status: Married    Spouse name: Not on file   Number of children: Not on file   Years of education: Not on file   Highest education level: Not on file  Occupational History   Not on file  Tobacco Use   Smoking status: Never   Smokeless tobacco: Never  Vaping Use   Vaping Use: Never used  Substance and Sexual Activity   Alcohol use: Yes    Alcohol/week: 10.0 - 14.0 standard drinks    Types: 10 - 14 Glasses of wine per week    Comment: social   Drug use: No   Sexual activity: Not on file  Other Topics Concern   Not on file  Social History Narrative   Married, lives with wife.    Right Handed   Drinks hot tea   Social Determinants of Health   Financial Resource Strain: Not on file  Food Insecurity: Not on file  Transportation Needs: Not on file  Physical Activity: Not on file  Stress: Not on file  Social Connections: Not on file  Intimate Partner Violence: Not on file      Marcial Pacas, M.D. Ph.D.  [facility] [address] [place], [place] [address] Ph: [phone] Fax: [phone]  CC:  [name], MD [place],  [place] [address]  [name], MD  ,,

## 2021-09-06 ENCOUNTER — Other Ambulatory Visit: Payer: Self-pay | Admitting: *Deleted

## 2021-09-06 DIAGNOSIS — C7A090 Malignant carcinoid tumor of the bronchus and lung: Secondary | ICD-10-CM

## 2021-10-20 ENCOUNTER — Other Ambulatory Visit: Payer: Medicare Other

## 2021-10-20 ENCOUNTER — Telehealth: Payer: Medicare Other | Admitting: Thoracic Surgery (Cardiothoracic Vascular Surgery)

## 2021-11-06 ENCOUNTER — Other Ambulatory Visit: Payer: Medicare Other

## 2021-11-09 ENCOUNTER — Ambulatory Visit
Admission: RE | Admit: 2021-11-09 | Discharge: 2021-11-09 | Disposition: A | Discharge status: Home / Self Care | Payer: Medicare Other | Source: Ambulatory Visit | Attending: Thoracic Surgery (Cardiothoracic Vascular Surgery) | Admitting: Thoracic Surgery (Cardiothoracic Vascular Surgery)

## 2021-11-09 DIAGNOSIS — C7A090 Malignant carcinoid tumor of the bronchus and lung: Secondary | ICD-10-CM

## 2021-11-10 ENCOUNTER — Ambulatory Visit (INDEPENDENT_AMBULATORY_CARE_PROVIDER_SITE_OTHER): Payer: Medicare Other | Admitting: Thoracic Surgery (Cardiothoracic Vascular Surgery)

## 2021-11-10 DIAGNOSIS — C7A090 Malignant carcinoid tumor of the bronchus and lung: Secondary | ICD-10-CM | POA: Diagnosis not present

## 2021-11-10 NOTE — Progress Notes (Signed)
     [address]       [place] [address]             [phone]       Patient: Home Provider: Office Consent for Telemedicine visit obtained.  Today's visit was completed via a real-time telehealth (see specific modality noted below). The patient/authorized person provided oral consent at the time of the visit to engage in a telemedicine encounter with the present provider at [facility]. The patient/authorized person was informed of the potential benefits, limitations, and risks of telemedicine. The patient/authorized person expressed understanding that the laws that protect confidentiality also apply to telemedicine. The patient/authorized person acknowledged understanding that telemedicine does not provide emergency services and that he or she would need to call 911 or proceed to the nearest hospital for help if such a need arose.   Total time spent in the clinical discussion 10 minutes.  Telehealth Modality: Phone visit (audio only)  I had a telephone visit with Mr. Downie.  He is status post the right VATS with wedge resection of intralobar lymph nodes.  Pathology was consistent with carcinoid tumor.  He had his annual surveillance CT scan which showed no recurrence or new disease.  He has no complaints.  He will follow-up in a year with another CT chest.   IMPRESSION: 1. Post wedge resection in the RIGHT chest with pleural and parenchymal scarring along the major fissure in the RIGHT middle and lower lobe not changed from previous imaging. 2. No new or progressive findings. 3. Aortic atherosclerosis.   Aortic Atherosclerosis (ICD10-I70.0).     Electronically Signed   By: Zetta Bills M.D.   On: 11/09/2021 14:48

## 2021-12-01 ENCOUNTER — Encounter: Payer: Self-pay | Admitting: Internal Medicine

## 2022-01-11 ENCOUNTER — Ambulatory Visit (INDEPENDENT_AMBULATORY_CARE_PROVIDER_SITE_OTHER): Payer: Medicare Other | Admitting: Neurology

## 2022-01-11 ENCOUNTER — Encounter: Payer: Self-pay | Admitting: Neurology

## 2022-01-11 VITALS — BP 116/74 | HR 69 | Ht 70.0 in | Wt 216.5 lb

## 2022-01-11 DIAGNOSIS — G4733 Obstructive sleep apnea (adult) (pediatric): Secondary | ICD-10-CM | POA: Insufficient documentation

## 2022-01-11 DIAGNOSIS — R51 Headache with orthostatic component, not elsewhere classified: Secondary | ICD-10-CM

## 2022-01-11 NOTE — Progress Notes (Signed)
Chief Complaint  Patient presents with   Follow-up    Rm 13. Alone. Pt reports headaches have improved. They are less frequent, less intense. States tylenol is helping with headaches.      ASSESSMENT AND PLAN  [name] is a 71 y.o. male   New onset headaches, Clearly described positional related  Often started from upper nuchal area, movement and cough worsening headache,  Normal MRI of the brain with without contrast, February 06, 2021,  Normal ESR C-reactive protein,  Tried Imitrex, Fioricet with mild to moderate improvement,   Overall has improved  Was put on Inderal 40 mg twice daily as headache prevention, was not sure about the benefit, okay to taper off  Obstructive sleep apnea  He has narrow oropharyngeal space, was diagnosis with OSA over more than 20 years ago, using CPAP machine, but setting was not adjusted, has not had sleep study for more than 5 years, still complains of daytime sleepiness fatigue sometimes,  Will refer to sleep study, which can contributed to his headache    DIAGNOSTIC DATA (LABS, IMAGING, TESTING) - I reviewed patient records, labs, notes, testing and imaging myself where available.   MEDICAL HISTORY:  [name] is a 71 year old male, seen in request by his primary care physician Dr. [name], for evaluation of occipital area headache initial evaluation was March 23, 2021  I reviewed and summarized the referring note. PMHx Right Lung Carcinoid tumor. OSA-CPAP.  Since March 2022, without clear triggers, he began to have frequent pressure headache, often started around noon, spreading forward, behind mild retro-orbital area temporal region pressure headache, lasting till nighttime, improved after he lie down, overnight sleep,  He denies lateralized motor or sensory deficit, tried Tylenol did not help, Advil helps some,  Denies a previous history of headache, denies gait abnormality, denies radiating pain to bilateral  shoulder, no upper extremity paresthesia or weakness  There is no limitation in his daily activity, he is able to continue to play golf without much of the difficulty, but sudden jolt of movement, such as cough, will trigger a moment of intense headache, only lasting few seconds,  I personally reviewed MRI of the brain with without contrast February 06, 2021, mild age-related changes, no acute abnormality, no evidence of significant cervical degenerative changes on the visible upper cervical region, no canal stenosis,  Because has history of obstructive sleep apnea, compliant with his CPAP machine, reported good sleep quality  UPDATE Jul 13 2021: He continues to complain of positional headaches since last visit in September, very good in the morning, gradually onset of headache in the afternoon, building up at the end of the day, improvement after overnight sleep, starting from occipital area, centered at the vertex region, pressure pain,  He did try Imitrex 50 mg as needed couple times, seems to help his headaches, he tolerated Inderal 40 mg twice a day without significant side effect  He suffered a chest cold, cough around Christmas time, significant headache with cough, become unbearable, was treated with Z-Pak, tapering dose of prednisone starting of 60 mg daily, just finished at the beginning of 2023, he reported significant improvement of his symptoms afterwards, has not had headache over the past 2 to 3 weeks  He denies any trigger before the gradual onset of headache since March 2022  UPDATE January 11 2022: He has been doing very well over the past few months, now has headache once to twice a week, mainly at bifrontal, behind eyes,  no significant light noise sensitivity, lasting for couple hours, he is taking Tylenol as needed, works for his headache most of the time, his headache is better lying down, getting worse sitting up  When he travels overseas, he reported increased headaches,  He  had long history of obstructive sleep apnea, has used CPAP machine for more than 10 years, but has not had it adjusted at least for over past 5 years, has not had sleep study for more than 5 years,  He has narrow oropharyngeal space, excessive daytime fatigue and sleepiness sometimes,   PHYSICAL EXAM:   Vitals:   01/11/22 1032  BP: 116/74  Pulse: 69  Weight: 216 lb 8 oz (98.2 kg)  Height: _0  (1.778 m)   Not recorded     Body mass index is 31.06 kg/m.  PHYSICAL EXAMNIATION:  Gen: NAD, conversant, well nourised, well groomed                     Cardiovascular: Regular rate rhythm, no peripheral edema, warm, nontender. Eyes: Conjunctivae clear without exudates or hemorrhage Neck: Supple, no carotid bruits. Pulmonary: Clear to auscultation bilaterally   NEUROLOGICAL EXAM:  MENTAL STATUS: Speech/cognition:  awake, alert, oriented to history taking and casual conversation  CRANIAL NERVES: CN II: Visual fields are full to confrontation. Pupils are round equal and briskly reactive to light. CN III, IV, VI: extraocular movement are normal. No ptosis. CN V: Facial sensation is intact to light touch CN VII: Face is symmetric with normal eye closure  CN VIII: Hearing is normal to causal conversation. CN IX, X: Phonation is normal. CN XI: Head turning and shoulder shrug are intact CN XII: Narrow oropharyngeal space  MOTOR: There is no pronator drift of out-stretched arms. Muscle bulk and tone are normal. Muscle strength is normal.  REFLEXES: Reflexes are present and symmetric    SENSORY: Intact to light touch   COORDINATION: There is no trunk or limb dysmetria noted.  GAIT/STANCE: Able to get up from seated position arm crossed, steady  REVIEW OF SYSTEMS:  Full 14 system review of systems performed and notable only for as above All other review of systems were negative.   ALLERGIES: Allergies  Allergen Reactions   Codeine Other (See Comments)    headache    Other     Other reaction(s): unable to sleep- Steroids    HOME MEDICATIONS: Current Outpatient Medications  Medication Sig Dispense Refill   butalbital-acetaminophen-caffeine (FIORICET) 50-325-40 MG tablet Take 1 tablet by mouth every 6 (six) hours as needed for headache. 12 tablet 5   Dermatological Products, Misc. (HPR PLUS) CREA Apply 1 application topically at bedtime. 450 g 8   ibuprofen (ADVIL) 200 MG tablet Take 200 mg by mouth every 8 (eight) hours as needed. Takes 2- 3 tabs     Milk Thistle 1000 MG CAPS Take 1,000 mg by mouth daily.      propranolol (INDERAL) 40 MG tablet Take 1 tablet (40 mg total) by mouth 2 (two) times daily. 60 tablet 11   rosuvastatin (CRESTOR) 10 MG tablet Take 10 mg by mouth daily in the afternoon.      triamcinolone cream (KENALOG) 0.1 % Apply 1 application topically daily. 454 g 1   SUMAtriptan (IMITREX) 50 MG tablet May repeat in 2 hours if headache persists or recurs. (Patient not taking: Reported on 01/11/2022) 12 tablet 6   No current facility-administered medications for this visit.    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   Eczema 08/08/2010   Family history of adverse reaction to anesthesia    mom- post op N/V   GERD (gastroesophageal reflux disease)    weight loss resolved gerd, no medication   Hyperlipidemia    PONV (postoperative nausea and vomiting)    Sleep apnea    uses CPAP nightly   Unilateral primary osteoarthritis, right knee 07/30/2019    PAST SURGICAL HISTORY: Past Surgical History:  Procedure Laterality Date   COLONOSCOPY     hx polyps   EYE SURGERY     lasik   INTERCOSTAL NERVE BLOCK  01/20/2019   Procedure: Intercostal Nerve Block;  Surgeon: Grace Isaac, MD;  Location: [place];  Service: Thoracic;;   KNEE ARTHROSCOPY Right    SEPTOPLASTY  1980   x 2   TONSILLECTOMY AND  ADENOIDECTOMY  1960   TOTAL KNEE ARTHROPLASTY Right 07/30/2019   TOTAL KNEE ARTHROPLASTY Right 07/30/2019   Procedure: RIGHT TOTAL KNEE ARTHROPLASTY;  Surgeon: Mcarthur Rossetti, MD;  Location: [facility];  Service: Orthopedics;  Laterality: Right;   VIDEO ASSISTED THORACOSCOPY (VATS)/WEDGE RESECTION Right 01/20/2019   Procedure: VIDEO ASSISTED THORACOSCOPY (VATS) WITH RESECTION OF RIGHT INTRAPULMONARY LYMPH NODE;  Surgeon: Grace Isaac, MD;  Location: [place];  Service: Thoracic;  Laterality: Right;   VIDEO BRONCHOSCOPY N/A 01/20/2019   Procedure: VIDEO BRONCHOSCOPY;  Surgeon: Grace Isaac, MD;  Location: [facility] OR;  Service: Thoracic;  Laterality: N/A;    FAMILY HISTORY: Family History  Problem Relation Age of Onset   Heart disease Mother        CABG age 46s   Colon cancer Neg Hx    Stomach cancer Neg Hx     SOCIAL HISTORY: Social History   Socioeconomic History   Marital status: Married    Spouse name: Not on file   Number of children: Not on file   Years of education: Not on file   Highest education level: Not on file  Occupational History   Not on file  Tobacco Use   Smoking status: Never   Smokeless tobacco: Never  Vaping Use   Vaping Use: Never used  Substance and Sexual Activity   Alcohol use: Yes    Alcohol/week: 10.0 - 14.0 standard drinks of alcohol    Types: 10 - 14 Glasses of wine per week    Comment: social   Drug use: No   Sexual activity: Not on file  Other Topics Concern   Not on file  Social History Narrative   Married, lives with wife.    Right Handed   Drinks hot tea   Social Determinants of Health   Financial Resource Strain: Not on file  Food Insecurity: Not on file  Transportation Needs: Not on file  Physical Activity: Not on file  Stress: Not on file  Social Connections: Not on file  Intimate Partner Violence: Not on file      Marcial Pacas, M.D. Ph.D.  [facility] [address] [place], [place]  [address] Ph: [phone] Fax: [phone]  CC:  [name], MD [place],  [place] [address]  [name], MD  ,,

## 2022-02-21 ENCOUNTER — Ambulatory Visit: Payer: Medicare Other | Admitting: Physician Assistant

## 2022-03-05 ENCOUNTER — Encounter: Payer: Self-pay | Admitting: Neurology

## 2022-03-05 ENCOUNTER — Ambulatory Visit (INDEPENDENT_AMBULATORY_CARE_PROVIDER_SITE_OTHER): Payer: Medicare Other | Admitting: Neurology

## 2022-03-05 VITALS — BP 117/73 | HR 75 | Ht 71.0 in | Wt 214.0 lb

## 2022-03-05 DIAGNOSIS — Z9989 Dependence on other enabling machines and devices: Secondary | ICD-10-CM

## 2022-03-05 DIAGNOSIS — G4733 Obstructive sleep apnea (adult) (pediatric): Secondary | ICD-10-CM | POA: Diagnosis not present

## 2022-03-05 NOTE — Progress Notes (Signed)
Subjective:    Patient ID: [name] is a 71 y.o. male.  HPI    Star Age, MD, PhD [facility] [address]O. [place], [place] [address]  Dear Aliene Beams,  I saw your patient, [name], upon your kind request in my sleep clinic today for initial consultation of his sleep disorder, in particular, evaluation of his prior diagnosis of obstructive sleep apnea.  The patient is unaccompanied today.  As you know, [name] is a 71 year old right-handed gentleman with an underlying medical history of arthritis, carcinoid tumor of the right lung, eczema, recurrent headaches, reflux disease, hyperlipidemia, osteoarthritis, and mild obesity, who was previously diagnosed with obstructive sleep apnea and placed on PAP therapy.  I reviewed your office note from 01/11/2022.  Prior sleep study results are not available for my review today.  Original sleep apnea was diagnosed several years ago and last sleep study was over 15 years ago.  He received a new CPAP machine in May 2020.  I was able to review his compliance data for the past 90 days from 12/06/2021 through 03/05/2022, during which time he used his machine every night, has not used it tonight yet of course.  He is on 100% compliant with an average usage of 9 hours and 38 minutes, residual AHI at goal at 0.1/h, treatment range of 9 to 20 cm with EPR.  He has a ResMed AirSense 10 AutoSet machine.  His 95th percentile of pressure is right at 11 cm, leak acceptable with the 95th percentile at 8.4 L/min.  His DME company is [employer].  His primary care prescribing machine.  He does not currently have a sleep specialist.  His Epworth sleepiness score is 4 out of 24, fatigue severity score is 14 out of 63.  He lives with his wife.  He uses nasal pillows.  Goes to bed around 9:30 PM or 10 PM and rise time is generally between 7 and 7:30 AM.  He does not have night to night nocturia and denies recurrent morning  headaches.  He does not drink coffee or soda daily, typically tea maybe 1 or 2/day.  He is a non-smoker and drinks alcohol in the form of wine, 1 or 2 glasses daily.  He had a tonsillectomy and adenoidectomy as a child.  His Past Medical History Is Significant For: Past Medical History:  Diagnosis Date   Arthritis    knee, no meds   Cancer ([place]) 2020   carcinoid tumor of right lung   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   Eczema 08/08/2010   Family history of adverse reaction to anesthesia    mom- post op N/V   GERD (gastroesophageal reflux disease)    weight loss resolved gerd, no medication   Hyperlipidemia    PONV (postoperative nausea and vomiting)    Sleep apnea    uses CPAP nightly   Unilateral primary osteoarthritis, right knee 07/30/2019    His Past Surgical History Is Significant For: Past Surgical History:  Procedure Laterality Date   COLONOSCOPY     hx polyps   EYE SURGERY     lasik   INTERCOSTAL NERVE BLOCK  01/20/2019   Procedure: Intercostal Nerve Block;  Surgeon: Grace Isaac, MD;  Location: [facility];  Service: Thoracic;;   KNEE ARTHROSCOPY Right    SEPTOPLASTY  1980   x 2   TONSILLECTOMY AND ADENOIDECTOMY  1960   TOTAL KNEE ARTHROPLASTY Right 07/30/2019   TOTAL KNEE ARTHROPLASTY  Right 07/30/2019   Procedure: RIGHT TOTAL KNEE ARTHROPLASTY;  Surgeon: Mcarthur Rossetti, MD;  Location: [facility];  Service: Orthopedics;  Laterality: Right;   VIDEO ASSISTED THORACOSCOPY (VATS)/WEDGE RESECTION Right 01/20/2019   Procedure: VIDEO ASSISTED THORACOSCOPY (VATS) WITH RESECTION OF RIGHT INTRAPULMONARY LYMPH NODE;  Surgeon: Grace Isaac, MD;  Location: [facility];  Service: Thoracic;  Laterality: Right;   VIDEO BRONCHOSCOPY N/A 01/20/2019   Procedure: VIDEO BRONCHOSCOPY;  Surgeon: Grace Isaac, MD;  Location: [facility];  Service: Thoracic;  Laterality: N/A;    His Family History Is Significant For: Family History  Problem Relation Age of Onset   Heart disease Mother         CABG age 64s   Colon cancer Neg Hx    Stomach cancer Neg Hx    Sleep apnea Neg Hx     His Social History Is Significant For: Social History   Socioeconomic History   Marital status: Married    Spouse name: Not on file   Number of children: Not on file   Years of education: Not on file   Highest education level: Not on file  Occupational History   Not on file  Tobacco Use   Smoking status: Never   Smokeless tobacco: Never  Vaping Use   Vaping Use: Never used  Substance and Sexual Activity   Alcohol use: Yes    Alcohol/week: 14.0 standard drinks of alcohol    Types: 14 Glasses of wine per week    Comment: social   Drug use: No   Sexual activity: Not on file  Other Topics Concern   Not on file  Social History Narrative   Married, lives with wife.    Right Handed   Drinks hot tea   Social Determinants of Health   Financial Resource Strain: Not on file  Food Insecurity: Not on file  Transportation Needs: Not on file  Physical Activity: Not on file  Stress: Not on file  Social Connections: Not on file    His Allergies Are:  Allergies  Allergen Reactions   Codeine Other (See Comments)    headache   Other     Other reaction(s): unable to sleep- Steroids  :   His Current Medications Are:  Outpatient Encounter Medications as of 03/05/2022  Medication Sig   ibuprofen (ADVIL) 200 MG tablet Take 200 mg by mouth every 8 (eight) hours as needed. Takes 2- 3 tabs   Milk Thistle 1000 MG CAPS Take 1,000 mg by mouth daily.    rosuvastatin (CRESTOR) 10 MG tablet Take 10 mg by mouth daily in the afternoon.    butalbital-acetaminophen-caffeine (FIORICET) 50-325-40 MG tablet Take 1 tablet by mouth every 6 (six) hours as needed for headache.   Dermatological Products, Misc. (HPR PLUS) CREA Apply 1 application topically at bedtime.   triamcinolone cream (KENALOG) 0.1 % Apply 1 application topically daily.   No facility-administered encounter medications on file as of  03/05/2022.  :   Review of Systems:  Out of a complete 14 point review of systems, all are reviewed and negative with the exception of these symptoms as listed below:  Review of Systems  Neurological:        Pt here for sleep consult    Pt states sleep study was at least 15 years ago Pt  set up date  for new Resmed was  10/2018 . Pt denies snoring,fatigue. Pt states some headaches      ESS:4 FSS:14  Objective:  Neurological Exam  Physical Exam Physical Examination:   Vitals:   03/05/22 1426  BP: 117/73  Pulse: 75    General Examination: The patient is a very pleasant 71 y.o. male in no acute distress. He appears well-developed and well-nourished and well groomed.   HEENT: Normocephalic, atraumatic, pupils are equal, round and reactive to light, extraocular tracking is good without limitation to gaze excursion or nystagmus noted. Hearing is grossly intact. Face is symmetric with normal facial animation. Speech is clear with no dysarthria noted. There is no hypophonia. There is no lip, neck/head, jaw or voice tremor. Neck is supple with full range of passive and active motion. There are no carotid bruits on auscultation. Oropharynx exam reveals: mild mouth dryness, adequate dental hygiene and mild airway crowding, due to small airway entry, slightly wider uvula noted, tonsils absent.  Mallampati class II.  Neck circumference of 16-7/8 inches, minimal overbite.  Tongue protrudes centrally and palate elevates symmetrically.  Chest: Clear to auscultation without wheezing, rhonchi or crackles noted.  Heart: S1+S2+0, regular and normal without murmurs, rubs or gallops noted.   Abdomen: Soft, non-tender and non-distended.  Extremities: There is no pitting edema in the distal lower extremities bilaterally.   Skin: Warm and dry without trophic changes noted.   Musculoskeletal: exam reveals no obvious joint deformities.   Neurologically:  Mental status: The patient is awake, alert  and oriented in all 4 spheres. His immediate and remote memory, attention, language skills and fund of knowledge are appropriate. There is no evidence of aphasia, agnosia, apraxia or anomia. Speech is clear with normal prosody and enunciation. Thought process is linear. Mood is normal and affect is normal.  Cranial nerves II - XII are as described above under HEENT exam.  Motor exam: Normal bulk, strength and tone is noted. There is no tremor, Romberg is negative. Reflexes are 2+ throughout. Fine motor skills and coordination: grossly intact.  Cerebellar testing: No dysmetria or intention tremor. There is no truncal or gait ataxia.  Sensory exam: intact to light touch in the upper and lower extremities.  Gait, station and balance: He stands easily. No veering to one side is noted. No leaning to one side is noted. Posture is age-appropriate and stance is narrow based. Gait shows normal stride length and normal pace. No problems turning are noted.   Assessment and Plan:  In summary, MAGGIE DWORKIN is a very pleasant 71 y.o.-year old male with an underlying medical history of arthritis, carcinoid tumor of the right lung, eczema, recurrent headaches, reflux disease, hyperlipidemia, osteoarthritis, and mild obesity, who presents for evaluation of his obstructive sleep apnea.  He has been on AutoPap therapy, received a new machine in May 2020, is currently not eligible for a new machine.  Apnea scores are good, leak acceptable, compliance is at 100%.  He is currently doing well on his AutoPap machine on the current settings.  He would be eligible for new equipment in May 2025.  He has been up-to-date with his supplies through his DME company, [employer].  When he is eligible for new equipment, we will proceed with sleep testing, we can pursue a home sleep test at that time as he reports that he did not have a good experience with a laboratory sleep study last time.  We can certainly pick up our discussion at that  moment.  He is advised to follow-up routinely in sleep clinic to see one of our nurse practitioners in 1 year.  He does  not need any changes to his machine settings, and generally is up-to-date with his supplies through his DME provider.  He is commended for his treatment adherence.  He is advised to call us with any interim questions or concerns or if he needs a prescription for new supplies I would be happy to provide it.  I answered all his questions today and he was in agreement.   Thank you very much for allowing me to participate in the care of this nice patient. If I can be of any further assistance to you please do not hesitate to talk to me.   Sincerely,   Star Age, MD, PhD

## 2022-03-07 ENCOUNTER — Encounter: Payer: Self-pay | Admitting: Internal Medicine

## 2022-03-20 ENCOUNTER — Telehealth: Payer: Self-pay | Admitting: Neurology

## 2022-03-20 DIAGNOSIS — M542 Cervicalgia: Secondary | ICD-10-CM

## 2022-03-20 DIAGNOSIS — R51 Headache with orthostatic component, not elsewhere classified: Secondary | ICD-10-CM

## 2022-03-20 NOTE — Telephone Encounter (Signed)
Pt said, would like Dr. Krista Blue review last CT Scan of the head done in 2022. Headaches could be result of arthritis. Would like a call from the neck.

## 2022-03-21 DIAGNOSIS — M542 Cervicalgia: Secondary | ICD-10-CM | POA: Insufficient documentation

## 2022-03-21 NOTE — Telephone Encounter (Signed)
I spoke with the patient and informed him that an MRI had been ordered. A follow up was scheduled for the patient with Butler Denmark, NP on 04/17/2022.

## 2022-03-21 NOTE — Telephone Encounter (Signed)
I spoke with the patient. He stated when he has a headache it is made better by laying down. He feels like a "pressure" is off of his neck. He would like a CT scan of the neck to rule out arthritis being the root cause of his headaches.

## 2022-03-21 NOTE — Telephone Encounter (Signed)
Orders Placed This Encounter  Procedures   MR CERVICAL SPINE WO CONTRAST     MRI Is a better image study. Put him on schedule with NP in 1-3 months follow up

## 2022-03-22 ENCOUNTER — Telehealth: Payer: Self-pay | Admitting: Neurology

## 2022-03-22 ENCOUNTER — Ambulatory Visit (AMBULATORY_SURGERY_CENTER): Payer: Self-pay

## 2022-03-22 VITALS — Ht 71.0 in | Wt 215.0 lb

## 2022-03-22 DIAGNOSIS — Z86010 Personal history of colonic polyps: Secondary | ICD-10-CM

## 2022-03-22 MED ORDER — NA SULFATE-K SULFATE-MG SULF 17.5-3.13-1.6 GM/177ML PO SOLN: 1.0000 | ORAL | 0 refills | Status: DC

## 2022-03-22 NOTE — Telephone Encounter (Signed)
[employer] medicare NPR sent to GI 

## 2022-03-22 NOTE — Progress Notes (Signed)
No egg or soy allergy known to patient  No issues known to pt with past sedation with any surgeries or procedures Patient denies ever being told they had issues or difficulty with intubation  No FH of Malignant Hyperthermia Pt is not on diet pills Pt is not on  home 02  Pt is not on blood thinners  Pt denies issues with constipation  No A fib or A flutter Have any cardiac testing pending--denied Pt instructed to use [email or web address] or GoodRx for a price reduction on prep   

## 2022-03-27 ENCOUNTER — Ambulatory Visit
Admission: RE | Admit: 2022-03-27 | Discharge: 2022-03-27 | Disposition: A | Discharge status: Home / Self Care | Payer: Medicare Other | Source: Ambulatory Visit | Attending: Neurology | Admitting: Neurology

## 2022-03-27 DIAGNOSIS — M542 Cervicalgia: Secondary | ICD-10-CM

## 2022-03-30 ENCOUNTER — Encounter (INDEPENDENT_AMBULATORY_CARE_PROVIDER_SITE_OTHER): Payer: Medicare Other | Admitting: Neurology

## 2022-03-30 DIAGNOSIS — R51 Headache with orthostatic component, not elsewhere classified: Secondary | ICD-10-CM | POA: Diagnosis not present

## 2022-03-30 DIAGNOSIS — M542 Cervicalgia: Secondary | ICD-10-CM

## 2022-04-04 NOTE — Telephone Encounter (Signed)
  Thank you for your message seeking medical advice.* My assessment and recommendation are as follows:  [name]:  I personally reviewed the MRI of the cervical report, the most noticeable degenerative changes sees at C5-6, with severe left foraminal stenosis,  Dermatome of lower cervical spines is at arm, instead of upwards towards your scalp.  I am not sure your cervical degenerative is directly related to your headache.  The typical story for patient with described left C5-6 foraminal stenosis is left-sided neck pain, radiating discomfort to left shoulder, left lateral arm.  But it can certainly cause muscle tension into the left shoulder and neck, sometimes the cervical paraspinal muscle tension can be evolving to a headache.  Those kind of cervicogenic muscle tension headache usually starting from the left neck, spreading forward.  This kind of headache can be helped by NSAIDs as needed, warm compression, neck stretching exercise.  Please let us know and we can be of any help    MRI cervical spine without contrast demonstrating - At C5-6 leftward disc protrusion with severe left foraminal stenosis. - At C6-7 disc bulging, uncovertebral joint and facet hypertrophy with severe right foraminal stenosis. - At C4-5 disc bulging and facet hypertrophy with moderate left foraminal stenosis.    Sincerely,  Danny Pacas, MD Ph.D.    *This exchange required the expertise of a doctor, nurse practitioner, physician assistant, optometrist or certified nurse midwife and qualifies as a Medical Advice Message, please visit [email or web address] for more details. [name] will bill your insurance on your behalf; copays and deductibles may apply. Questions? Reply to this message.    Total time spent reviewing the chart, obtaining history, review films, documentation, care coordination was 10 minutes.

## 2022-04-10 ENCOUNTER — Encounter: Payer: Self-pay | Admitting: Internal Medicine

## 2022-04-16 NOTE — Progress Notes (Signed)
Patient: [name] Date of Birth: [birth year]  Reason for Visit: Follow up History from: Patient Primary Neurologist: Krista Blue  ASSESSMENT AND PLAN 71 y.o. year old male   56.  New onset headaches 2.  Cervicogenic headache -Headaches continue but are improved, happens 2 days a week -MRI of the brain with and without contrast was normal August 2022 -Normal ESR, CRP -Mild to moderate improvement with Imitrex, Fioricet -MRI cervical spine showed multilevel degenerative changes, most notable at C4-5, C5-6, C6-7, variable degree of foraminal narrowing -Referral to physical therapy for neuromuscular therapy -Recommended stretching, warm compress, NSAIDs -He wishes to hold off on any daily medication, also offered muscle relaxer as needed -Follow-up in 6 months or sooner if needed  3.  OSA on CPAP -Continue use, has follow up next year  03/27/22 MRI cervical spine without contrast demonstrating - At C5-6 leftward disc protrusion with severe left foraminal stenosis. - At C6-7 disc bulging, uncovertebral joint and facet hypertrophy with severe right foraminal stenosis. - At C4-5 disc bulging and facet hypertrophy with moderate left foraminal stenosis.  HISTORY  [name] is a 71 year old male, seen in request by his primary care physician Dr. [name], for evaluation of occipital area headache initial evaluation was March 23, 2021   I reviewed and summarized the referring note. PMHx Right Lung Carcinoid tumor. OSA-CPAP.   Since March 2022, without clear triggers, he began to have frequent pressure headache, often started around noon, spreading forward, behind mild retro-orbital area temporal region pressure headache, lasting till nighttime, improved after he lie down, overnight sleep,  He denies lateralized motor or sensory deficit, tried Tylenol did not help, Advil helps some,   Denies a previous history of headache, denies gait abnormality, denies radiating pain  to bilateral shoulder, no upper extremity paresthesia or weakness  There is no limitation in his daily activity, he is able to continue to play golf without much of the difficulty, but sudden jolt of movement, such as cough, will trigger a moment of intense headache, only lasting few seconds,  I personally reviewed MRI of the brain with without contrast February 06, 2021, mild age-related changes, no acute abnormality, no evidence of significant cervical degenerative changes on the visible upper cervical region, no canal stenosis,   Because has history of obstructive sleep apnea, compliant with his CPAP machine, reported good sleep quality   UPDATE Jul 13 2021: He continues to complain of positional headaches since last visit in September, very good in the morning, gradually onset of headache in the afternoon, building up at the end of the day, improvement after overnight sleep, starting from occipital area, centered at the vertex region, pressure pain,  He did try Imitrex 50 mg as needed couple times, seems to help his headaches, he tolerated Inderal 40 mg twice a day without significant side effect  He suffered a chest cold, cough around Christmas time, significant headache with cough, become unbearable, was treated with Z-Pak, tapering dose of prednisone starting of 60 mg daily, just finished at the beginning of 2023, he reported significant improvement of his symptoms afterwards, has not had headache over the past 2 to 3 weeks   He denies any trigger before the gradual onset of headache since March 2022   UPDATE January 11 2022: He has been doing very well over the past few months, now has headache once to twice a week, mainly at bifrontal, behind eyes, no significant light noise sensitivity, lasting for couple hours, he  is taking Tylenol as needed, works for his headache most of the time, his headache is better lying down, getting worse sitting up   When he travels overseas, he reported increased  headaches,  He had long history of obstructive sleep apnea, has used CPAP machine for more than 10 years, but has not had it adjusted at least for over past 5 years, has not had sleep study for more than 5 years,   He has narrow oropharyngeal space, excessive daytime fatigue and sleepiness sometimes,  Update April 17, 2022 SS: saw Dr. Rexene Alberts for sleep consult Sept 2023, not eligible for new equipment until May 2025 for CPAP.  Encouraged to continue his current settings. Last visit in July 2023, stopped Inderal. Headaches have improved, but continue, base of neck, radiates around like a cap. No migraine features, happens 2 days a week, will lie down take Aleve, with good benefit.  Denies any radicular symptoms to either arm.  He is retired.  03/27/22 MRI cervical spine without contrast demonstrating - At C5-6 leftward disc protrusion with severe left foraminal stenosis. - At C6-7 disc bulging, uncovertebral joint and facet hypertrophy with severe right foraminal stenosis. - At C4-5 disc bulging and facet hypertrophy with moderate left foraminal stenosis.  REVIEW OF SYSTEMS: Out of a complete 14 system review of symptoms, the patient complains only of the following symptoms, and all other reviewed systems are negative.  See HPI  ALLERGIES: Allergies  Allergen Reactions   Codeine Other (See Comments)    headache   Other     Other reaction(s): unable to sleep- Steroids    HOME MEDICATIONS: Outpatient Medications Prior to Visit  Medication Sig Dispense Refill   Dermatological Products, Misc. (HPR PLUS) CREA Apply 1 application topically at bedtime. 450 g 8   ibuprofen (ADVIL) 200 MG tablet Take 200 mg by mouth every 8 (eight) hours as needed. Takes 2- 3 tabs     Milk Thistle 1000 MG CAPS Take 1,000 mg by mouth daily.      Na Sulfate-K Sulfate-Mg Sulf 17.5-3.13-1.6 GM/177ML SOLN Take 1 kit by mouth as directed. May use generic Suprep, no prior authorization. Take as directed. 354 mL 0    OZEMPIC, 1 MG/DOSE, 4 MG/3ML SOPN Inject 1 mg into the skin once a week.     rosuvastatin (CRESTOR) 10 MG tablet Take 10 mg by mouth daily in the afternoon.      triamcinolone cream (KENALOG) 0.1 % Apply 1 application topically daily. 454 g 1   sildenafil (VIAGRA) 100 MG tablet take one 30 min. to 4 hrs prior to sexual activity Oral Once a day as needed for 30 days     No facility-administered medications prior to visit.    PAST MEDICAL HISTORY: Past Medical History:  Diagnosis Date   Arthritis    knee, no meds   Cancer ([place]) 2020   carcinoid tumor of right lung   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   Eczema 08/08/2010   Family history of adverse reaction to anesthesia    mom- post op N/V   Hyperlipidemia    PONV (postoperative nausea and vomiting)    Sleep apnea    uses CPAP nightly   Unilateral primary osteoarthritis, right knee 07/30/2019    PAST SURGICAL HISTORY: Past Surgical History:  Procedure Laterality Date   COLONOSCOPY     hx polyps   EYE SURGERY     lasik   INTERCOSTAL NERVE BLOCK  01/20/2019   Procedure: Intercostal Nerve Block;  Surgeon: Grace Isaac, MD;  Location: [place];  Service: Thoracic;;   KNEE ARTHROSCOPY Right    SEPTOPLASTY  1980   x 2   TONSILLECTOMY AND ADENOIDECTOMY  1960   TOTAL KNEE ARTHROPLASTY Right 07/30/2019   TOTAL KNEE ARTHROPLASTY Right 07/30/2019   Procedure: RIGHT TOTAL KNEE ARTHROPLASTY;  Surgeon: Mcarthur Rossetti, MD;  Location: [place];  Service: Orthopedics;  Laterality: Right;   VIDEO ASSISTED THORACOSCOPY (VATS)/WEDGE RESECTION Right 01/20/2019   Procedure: VIDEO ASSISTED THORACOSCOPY (VATS) WITH RESECTION OF RIGHT INTRAPULMONARY LYMPH NODE;  Surgeon: Grace Isaac, MD;  Location: [place];  Service: Thoracic;  Laterality: Right;   VIDEO BRONCHOSCOPY N/A 01/20/2019   Procedure: VIDEO BRONCHOSCOPY;  Surgeon: Grace Isaac, MD;  [facility];  Service: Thoracic;  Laterality: N/A;    FAMILY HISTORY: Family History   Problem Relation Age of Onset   Heart disease Mother        CABG age 67s   Stomach cancer Neg Hx    Sleep apnea Neg Hx    Colon cancer Neg Hx    Colon polyps Neg Hx    Esophageal cancer Neg Hx    Rectal cancer Neg Hx     SOCIAL HISTORY: Social History   Socioeconomic History   Marital status: Married    Spouse name: Not on file   Number of children: Not on file   Years of education: Not on file   Highest education level: Not on file  Occupational History   Not on file  Tobacco Use   Smoking status: Never   Smokeless tobacco: Never  Vaping Use   Vaping Use: Never used  Substance and Sexual Activity   Alcohol use: Yes    Alcohol/week: 14.0 standard drinks of alcohol    Types: 14 Glasses of wine per week    Comment: social   Drug use: No   Sexual activity: Not on file  Other Topics Concern   Not on file  Social History Narrative   Married, lives with wife.    Right Handed   Drinks hot tea   Social Determinants of Health   Financial Resource Strain: Not on file  Food Insecurity: Not on file  Transportation Needs: Not on file  Physical Activity: Not on file  Stress: Not on file  Social Connections: Not on file  Intimate Partner Violence: Not on file   PHYSICAL EXAM  Vitals:   04/17/22 0913  BP: (!) 148/84  Pulse: 69  Weight: 213 lb (96.6 kg)  Height: $Remove'5\' 11"'RVGhWlu$  (1.803 m)   Body mass index is 29.71 kg/m.  Generalized: Well developed, in no acute distress  Neurological examination  Mentation: Alert oriented to time, place, history taking. Follows all commands speech and language fluent Cranial nerve II-XII: Pupils were equal round reactive to light. Extraocular movements were full, visual field were full on confrontational test. Facial sensation and strength were normal.  Head turning and shoulder shrug  were normal and symmetric. Motor: The motor testing reveals 5 over 5 strength of all 4 extremities. Good symmetric motor tone is noted throughout.  Sensory:  Sensory testing is intact to soft touch on all 4 extremities. No evidence of extinction is noted.  Coordination: Cerebellar testing reveals good finger-nose-finger and heel-to-shin bilaterally.  Gait and station: Gait is normal. Tandem gait is normal.  Reflexes: Deep tendon reflexes are symmetric and normal bilaterally.   DIAGNOSTIC DATA (LABS, IMAGING, TESTING) - I reviewed patient records, labs, notes, testing and  imaging myself where available.  Lab Results  Component Value Date   WBC 7.8 07/31/2019   HGB 11.7 (L) 07/31/2019   HCT 35.1 (L) 07/31/2019   MCV 92.6 07/31/2019   PLT 179 07/31/2019      Component Value Date/Time   NA 139 07/31/2019 0315   K 4.0 07/31/2019 0315   CL 102 07/31/2019 0315   CO2 26 07/31/2019 0315   GLUCOSE 160 (H) 07/31/2019 0315   BUN 16 07/31/2019 0315   CREATININE 0.83 07/31/2019 0315   CALCIUM 8.3 (L) 07/31/2019 0315   PROT 5.3 (L) 01/22/2019 0204   ALBUMIN 3.1 (L) 01/22/2019 0204   AST 18 01/22/2019 0204   ALT 15 01/22/2019 0204   ALKPHOS 45 01/22/2019 0204   BILITOT 0.9 01/22/2019 0204   GFRNONAA >60 07/31/2019 0315   GFRAA >60 07/31/2019 0315   No results found for: "CHOL", "HDL", "LDLCALC", "LDLDIRECT", "TRIG", "CHOLHDL" No results found for: "HGBA1C" No results found for: "VITAMINB12" No results found for: "TSH"  Butler Denmark, AGNP-C, DNP 04/17/2022, 10:24 AM [facility] [address] [place], [place] [address] [phone]

## 2022-04-17 ENCOUNTER — Ambulatory Visit (INDEPENDENT_AMBULATORY_CARE_PROVIDER_SITE_OTHER): Payer: Medicare Other | Admitting: Neurology

## 2022-04-17 ENCOUNTER — Encounter: Payer: Self-pay | Admitting: Neurology

## 2022-04-17 ENCOUNTER — Other Ambulatory Visit: Payer: Self-pay

## 2022-04-17 ENCOUNTER — Ambulatory Visit: Payer: Medicare Other | Attending: Neurology | Admitting: Physical Therapy

## 2022-04-17 VITALS — BP 148/84 | HR 69 | Ht 71.0 in | Wt 213.0 lb

## 2022-04-17 DIAGNOSIS — M542 Cervicalgia: Secondary | ICD-10-CM | POA: Insufficient documentation

## 2022-04-17 DIAGNOSIS — M62838 Other muscle spasm: Secondary | ICD-10-CM | POA: Insufficient documentation

## 2022-04-17 DIAGNOSIS — M6281 Muscle weakness (generalized): Secondary | ICD-10-CM | POA: Insufficient documentation

## 2022-04-17 DIAGNOSIS — R51 Headache with orthostatic component, not elsewhere classified: Secondary | ICD-10-CM | POA: Diagnosis not present

## 2022-04-17 DIAGNOSIS — R279 Unspecified lack of coordination: Secondary | ICD-10-CM | POA: Insufficient documentation

## 2022-04-17 NOTE — Patient Instructions (Signed)
I will refer you to physical therapy.

## 2022-04-17 NOTE — Patient Instructions (Signed)

## 2022-04-17 NOTE — Therapy (Signed)
OUTPATIENT PHYSICAL THERAPY CERVICAL EVALUATION   Patient Name: [name] MRN: [id number] DOB:1951-04-10, 71 y.o., male Today's Date: 04/17/2022   PT End of Session - 04/17/22 1523     Visit Number 1    Date for PT Re-Evaluation 05/29/22    Authorization Type [employer] MCR    PT Start Time 1829    PT Stop Time 1528    PT Time Calculation (min) 41 min    Activity Tolerance Patient tolerated treatment well    Behavior During Therapy [facility] for tasks assessed/performed             Past Medical History:  Diagnosis Date   Arthritis    knee, no meds   Cancer ([place]) 2020   carcinoid tumor of right lung   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   Eczema 08/08/2010   Family history of adverse reaction to anesthesia    mom- post op N/V   Hyperlipidemia    PONV (postoperative nausea and vomiting)    Sleep apnea    uses CPAP nightly   Unilateral primary osteoarthritis, right knee 07/30/2019   Past Surgical History:  Procedure Laterality Date   COLONOSCOPY     hx polyps   EYE SURGERY     lasik   INTERCOSTAL NERVE BLOCK  01/20/2019   Procedure: Intercostal Nerve Block;  Surgeon: Grace Isaac, MD;  Location: [place];  Service: Thoracic;;   KNEE ARTHROSCOPY Right    SEPTOPLASTY  1980   x 2   TONSILLECTOMY AND ADENOIDECTOMY  1960   TOTAL KNEE ARTHROPLASTY Right 07/30/2019   TOTAL KNEE ARTHROPLASTY Right 07/30/2019   Procedure: RIGHT TOTAL KNEE ARTHROPLASTY;  Surgeon: Mcarthur Rossetti, MD;  Location: [place];  Service: Orthopedics;  Laterality: Right;   VIDEO ASSISTED THORACOSCOPY (VATS)/WEDGE RESECTION Right 01/20/2019   Procedure: VIDEO ASSISTED THORACOSCOPY (VATS) WITH RESECTION OF RIGHT INTRAPULMONARY LYMPH NODE;  Surgeon: Grace Isaac, MD;  Location: [employer];  Service: Thoracic;  Laterality: Right;   VIDEO BRONCHOSCOPY N/A 01/20/2019   Procedure: VIDEO BRONCHOSCOPY;  Surgeon: Grace Isaac, MD;  Location: [place];  Service: Thoracic;  Laterality: N/A;   Patient  Active Problem List   Diagnosis Date Noted   Neck pain 03/21/2022   Obstructive sleep apnea 01/11/2022   Positional headache 07/13/2021   Unilateral primary osteoarthritis, right knee 07/30/2019   Status post total right knee replacement 07/30/2019   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   COLONIC POLYPS, ADENOMATOUS 05/24/2008   DIVERTICULOSIS, COLON 05/24/2008    PCP: [name], MD   REFERRING PROVIDER: Suzzanne Cloud, NP    REFERRING DIAG: M54.2 (ICD-10-CM) - Cervicalgia  THERAPY DIAG:  Other muscle spasm  Muscle weakness (generalized)  Unspecified lack of coordination  Cervicalgia  Rationale for Evaluation and Treatment Rehabilitation  ONSET DATE: spring 2023  SUBJECTIVE:  SUBJECTIVE STATEMENT: Pt reports he has headaches intermittently 2-3x per week, 6-7 pain starting base of neck into front of head.   PERTINENT HISTORY:  Nothing of note  PAIN:  Are you having pain? Yes: NPRS scale: 7/10 Pain location: posterior neck into head Pain description: achy Aggravating factors: nothing Relieving factors: lying down, lowering pressure of neck  PRECAUTIONS: None  WEIGHT BEARING RESTRICTIONS: No  FALLS:  Has patient fallen in last 6 months? No  LIVING ENVIRONMENT: Lives with: lives with their family Lives in: House/apartment  OCCUPATION: retired  PLOF: Independent  PATIENT GOALS: to have less pain  OBJECTIVE:   DIAGNOSTIC FINDINGS:  MRI of the brain with and without contrast was normal August 2022 03/27/22 MRI cervical spine without contrast demonstrating - At C5-6 leftward disc protrusion with severe left foraminal stenosis. - At C6-7 disc bulging, uncovertebral joint and facet hypertrophy with severe right foraminal stenosis. - At C4-5 disc bulging and  facet hypertrophy with moderate left foraminal stenosis.  PATIENT SURVEYS:  FOTO next session   COGNITION: Overall cognitive status: Within functional limits for tasks assessed  SENSATION: WFL  POSTURE: rounded shoulders and forward head  PALPATION: TTP at bil upper traps, scalenes mildly TTP, and bil suboccipitals TTP.  Tension noted throughout these areas as well  CERVICAL ROM:   Active ROM A/PROM (deg) eval  Flexion 30  Extension 55  Right lateral flexion 35  Left lateral flexion 40  Right rotation 40  Left rotation 50   (Blank rows = not tested)  UPPER EXTREMITY ROM:  WFL  UPPER EXTREMITY MMT:  WFL  CERVICAL SPECIAL TESTS:  Spurling's test: Negative and Distraction test: Negative    TODAY'S TREATMENT:                                                                                                                      DATE:  Examination completed, findings reviewed, pt educated on POC, HEP, and dry needling handout. Addaday used at bil upper traps, scalenes, and upper cervical region, session ended with heat for 5 mins at cervical spine with pt in supine. Pt reported no pain at end of session. Pt motivated to participate in PT and agreeable to attempt recommendations.    PATIENT EDUCATION:  Education details: [id number] Person educated: Patient Education method: Explanation, Demonstration, Tactile cues, Verbal cues, and Handouts Education comprehension: verbalized understanding and returned demonstration  HOME EXERCISE PROGRAM: [id number]  ASSESSMENT:  CLINICAL IMPRESSION: Patient is a 71 y.o. male  who was seen today for physical therapy evaluation and treatment for cervicalgia. Pt has had headaches for the past several months, worse in the evening/afternoons and occur 2-3x per week. Pt denied any trauma to cause headaches. Pt found to have restriction ROM in all directions with pain worse with Rt side bending, extension, Rt rotation. Pt demonstrated bil UE ROM  and strengthen WFL throughout and denied numbness/tingling. Pt did have TTP at bil upper traps, scalenes mildly TTP, and bil suboccipitals TTP. And muscle tension  noted in these areas as well, pt sits with rounded shoulders and forward head, with cues able to achieve normal posture without pain but noted tightness felt in bil rhomboids and without cues quickly returned to resting posture of rounded. Pt would benefit from additional PT to further address deficits.     OBJECTIVE IMPAIRMENTS: decreased coordination, decreased endurance, decreased mobility, decreased strength, increased fascial restrictions, increased muscle spasms, impaired flexibility, improper body mechanics, postural dysfunction, and pain.   ACTIVITY LIMITATIONS:  pt reports he is able to complete tasks but when headaches are worse he does need to lay down to relieve them  PARTICIPATION LIMITATIONS: community activity  PERSONAL FACTORS: Fitness and Time since onset of injury/illness/exacerbation are also affecting patient's functional outcome.   REHAB POTENTIAL: Good  CLINICAL DECISION MAKING: Stable/uncomplicated  EVALUATION COMPLEXITY: Low   GOALS: Goals reviewed with patient? Yes  SHORT TERM GOALS: Target date: 05/15/2022   Pt to be I with HEP.  Baseline:  Goal status: INITIAL  2.  Pt to report no more than 1-2 headaches per week to demonstrate decreased frequency of pain and improve QOL.  Baseline:  Goal status: INITIAL  3.  Pt to report no more than 5/10 pain with headaches due to improved muscle length and postural strength.  Baseline:  Goal status: INITIAL   LONG TERM GOALS: Target date:  05/29/22  Pt to be I with advanced HEP.  Baseline:  Goal status: INITIAL  2.  Pt to report no more than 1-2 headaches per month to demonstrate decreased frequency of pain and improve QOL.  Baseline:  Goal status: INITIAL  3.  Pt to report no more than 3/10 pain with headaches due to improved muscle length and  postural strength.  Baseline:  Goal status: INITIAL  4.  Pt to demonstrate normal cervical ROM without pain due to improved tissue mobility and length for decreased headaches..  Baseline:  Goal status: INITIAL  5.  Pt to demonstrate ability to maintain upright posture for at least 30 mins without pain due to improved strength on postural muscles.  Baseline:  Goal status: INITIAL   PLAN:  PT FREQUENCY: 1-2x/week  PT DURATION: 6 weeks  PLANNED INTERVENTIONS: Therapeutic exercises, Therapeutic activity, Neuromuscular re-education, Patient/Family education, Self Care, Joint mobilization, Joint manipulation, Dry Needling, Electrical stimulation, Spinal mobilization, Cryotherapy, Moist heat, scar mobilization, Taping, Traction, Ultrasound, Ionotophoresis '4mg'$ /ml Dexamethasone, and Manual therapy  PLAN FOR NEXT SESSION: posture strengthening, cervical stretching, pec stretching, dry needling  Stacy Gardner, PT, DPT 10/24/233:54 PM

## 2022-04-19 ENCOUNTER — Ambulatory Visit (AMBULATORY_SURGERY_CENTER): Payer: Medicare Other | Admitting: Internal Medicine

## 2022-04-19 ENCOUNTER — Encounter: Payer: Self-pay | Admitting: Internal Medicine

## 2022-04-19 VITALS — BP 105/66 | HR 64 | Temp 98.0°F | Resp 13 | Ht 71.0 in | Wt 215.0 lb

## 2022-04-19 DIAGNOSIS — K635 Polyp of colon: Secondary | ICD-10-CM

## 2022-04-19 DIAGNOSIS — D128 Benign neoplasm of rectum: Secondary | ICD-10-CM

## 2022-04-19 DIAGNOSIS — Z86010 Personal history of colonic polyps: Secondary | ICD-10-CM | POA: Diagnosis not present

## 2022-04-19 DIAGNOSIS — Z09 Encounter for follow-up examination after completed treatment for conditions other than malignant neoplasm: Secondary | ICD-10-CM | POA: Diagnosis not present

## 2022-04-19 DIAGNOSIS — K621 Rectal polyp: Secondary | ICD-10-CM | POA: Diagnosis not present

## 2022-04-19 DIAGNOSIS — D125 Benign neoplasm of sigmoid colon: Secondary | ICD-10-CM

## 2022-04-19 MED ORDER — SODIUM CHLORIDE 0.9 % IV SOLN: 500.0000 mL | Freq: Once | INTRAVENOUS | Status: DC

## 2022-04-19 NOTE — Progress Notes (Signed)
Report to PACU, RN, vss, BBS= Clear.  

## 2022-04-19 NOTE — Progress Notes (Signed)
HISTORY OF PRESENT ILLNESS:  [name] is a 71 y.o. male who presents today for surveillance colonoscopy due to history of multiple adenomatous colon polyps  REVIEW OF SYSTEMS:  All non-GI ROS negative except for  Past Medical History:  Diagnosis Date   Arthritis    knee, no meds   Cancer ([place]) 2020   carcinoid tumor of right lung   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   Eczema 08/08/2010   Family history of adverse reaction to anesthesia    mom- post op N/V   Hyperlipidemia    PONV (postoperative nausea and vomiting)    Sleep apnea    uses CPAP nightly   Unilateral primary osteoarthritis, right knee 07/30/2019    Past Surgical History:  Procedure Laterality Date   COLONOSCOPY     hx polyps   EYE SURGERY     lasik   INTERCOSTAL NERVE BLOCK  01/20/2019   Procedure: Intercostal Nerve Block;  Surgeon: Grace Isaac, MD;  Location: [place];  Service: Thoracic;;   KNEE ARTHROSCOPY Right    SEPTOPLASTY  1980   x 2   TONSILLECTOMY AND ADENOIDECTOMY  1960   TOTAL KNEE ARTHROPLASTY Right 07/30/2019   TOTAL KNEE ARTHROPLASTY Right 07/30/2019   Procedure: RIGHT TOTAL KNEE ARTHROPLASTY;  Surgeon: Mcarthur Rossetti, MD;  Location: [place];  Service: Orthopedics;  Laterality: Right;   VIDEO ASSISTED THORACOSCOPY (VATS)/WEDGE RESECTION Right 01/20/2019   Procedure: VIDEO ASSISTED THORACOSCOPY (VATS) WITH RESECTION OF RIGHT INTRAPULMONARY LYMPH NODE;  Surgeon: Grace Isaac, MD;  Location: [facility];  Service: Thoracic;  Laterality: Right;   VIDEO BRONCHOSCOPY N/A 01/20/2019   Procedure: VIDEO BRONCHOSCOPY;  Surgeon: Grace Isaac, MD;  Location: [facility] OR;  Service: Thoracic;  Laterality: N/A;    Social History [name]  reports that he has never smoked. He has never used smokeless tobacco. He reports current alcohol use of about 14.0 standard drinks of alcohol per week. He reports that he does not use drugs.  family history includes Heart disease in his  mother.  Allergies  Allergen Reactions   Codeine Other (See Comments)    headache   Other     Other reaction(s): unable to sleep- Steroids       PHYSICAL EXAMINATION: Vital signs: BP (!) 148/78   Pulse 61   Temp 98 F (36.7 C) (Temporal)   Resp 13   Ht '5\' 11"'$  (1.803 m)   Wt 215 lb (97.5 kg)   SpO2 100%   BMI 29.99 kg/m  General: Well-developed, well-nourished, no acute distress HEENT: Sclerae are anicteric, conjunctiva pink. Oral mucosa intact Lungs: Clear Heart: Regular Abdomen: soft, nontender, nondistended, no obvious ascites, no peritoneal signs, normal bowel sounds. No organomegaly. Extremities: No edema Psychiatric: alert and oriented x3. Cooperative      ASSESSMENT:  Personal history of multiple adenomatous colon polyps   PLAN:  Surveillance colonoscopy

## 2022-04-19 NOTE — Patient Instructions (Signed)
Await pathology results.  Handout on polyps and diverticulosis given.  YOU HAD AN ENDOSCOPIC PROCEDURE TODAY AT [facility]:   Refer to the procedure report that was given to you for any specific questions about what was found during the examination.  If the procedure report does not answer your questions, please call your gastroenterologist to clarify.  If you requested that your care partner not be given the details of your procedure findings, then the procedure report has been included in a sealed envelope for you to review at your convenience later.  YOU SHOULD EXPECT: Some feelings of bloating in the abdomen. Passage of more gas than usual.  Walking can help get rid of the air that was put into your GI tract during the procedure and reduce the bloating. If you had a lower endoscopy (such as a colonoscopy or flexible sigmoidoscopy) you may notice spotting of blood in your stool or on the toilet paper. If you underwent a bowel prep for your procedure, you may not have a normal bowel movement for a few days.  Please Note:  You might notice some irritation and congestion in your nose or some drainage.  This is from the oxygen used during your procedure.  There is no need for concern and it should clear up in a day or so.  SYMPTOMS TO REPORT IMMEDIATELY:  Following lower endoscopy (colonoscopy or flexible sigmoidoscopy):  Excessive amounts of blood in the stool  Significant tenderness or worsening of abdominal pains  Swelling of the abdomen that is new, acute  Fever of 100F or higher   For urgent or emergent issues, a gastroenterologist can be reached at any hour by calling [phone]. Do not use MyChart messaging for urgent concerns.    DIET:  We do recommend a small meal at first, but then you may proceed to your regular diet.  Drink plenty of fluids but you should avoid alcoholic beverages for 24 hours.  ACTIVITY:  You should plan to take it easy for the rest of  today and you should NOT DRIVE or use heavy machinery until tomorrow (because of the sedation medicines used during the test).    FOLLOW UP: Our staff will call the number listed on your records the next business day following your procedure.  We will call around 7:15- 8:00 am to check on you and address any questions or concerns that you may have regarding the information given to you following your procedure. If we do not reach you, we will leave a message.     If any biopsies were taken you will be contacted by phone or by letter within the next 1-3 weeks.  Please call us at [phone] if you have not heard about the biopsies in 3 weeks.    SIGNATURES/CONFIDENTIALITY: You and/or your care partner have signed paperwork which will be entered into your electronic medical record.  These signatures attest to the fact that that the information above on your After Visit Summary has been reviewed and is understood.  Full responsibility of the confidentiality of this discharge information lies with you and/or your care-partner.

## 2022-04-19 NOTE — Progress Notes (Signed)
Called to room to assist during endoscopic procedure.  Patient ID and intended procedure confirmed with present staff. Received instructions for my participation in the procedure from the performing physician.  

## 2022-04-19 NOTE — Op Note (Signed)
[place] Patient Name: [name] Procedure Date: 04/19/2022 8:55 AM MRN: [id number] Endoscopist: Docia Chuck. Henrene Pastor , MD, [id number] Age: 71 Referring MD:  Date of Birth: Oct 26, 1950 Gender: Male Account #: [id number] Procedure:                Colonoscopy with cold snare polypectomy x 2 Indications:              High risk colon cancer surveillance: Personal                            history of multiple (3 or more) adenomas. Previous                            examinations 2008, 2013, 2018 Medicines:                Monitored Anesthesia Care Procedure:                Pre-Anesthesia Assessment:                           - Prior to the procedure, a History and Physical                            was performed, and patient medications and                            allergies were reviewed. The patient's tolerance of                            previous anesthesia was also reviewed. The risks                            and benefits of the procedure and the sedation                            options and risks were discussed with the patient.                            All questions were answered, and informed consent                            was obtained. Prior Anticoagulants: The patient has                            taken no anticoagulant or antiplatelet agents. ASA                            Grade Assessment: II - A patient with mild systemic                            disease. After reviewing the risks and benefits,                            the patient was deemed in satisfactory condition to  undergo the procedure.                           After obtaining informed consent, the colonoscope                            was passed under direct vision. Throughout the                            procedure, the patient's blood pressure, pulse, and                            oxygen saturations were monitored continuously. The                             Olympus CF-HQ190L ([id number]) Colonoscope was                            introduced through the anus and advanced to the the                            cecum, identified by appendiceal orifice and                            ileocecal valve. The ileocecal valve, appendiceal                            orifice, and rectum were photographed. The quality                            of the bowel preparation was excellent. The                            colonoscopy was performed without difficulty. The                            patient tolerated the procedure well. The bowel                            preparation used was SUPREP via split dose                            instruction. Scope In: 9:03:09 AM Scope Out: 9:17:39 AM Scope Withdrawal Time: 0 hours 11 minutes 56 seconds  Total Procedure Duration: 0 hours 14 minutes 30 seconds  Findings:                 Two polyps were found in the rectum and sigmoid                            colon. The polyps were 1 to 2 mm in size. These                            polyps were removed with a cold snare. Resection  and retrieval were complete.                           Diverticula were found in the sigmoid colon.                           Internal hemorrhoids were found during retroflexion.                           The exam was otherwise without abnormality on                            direct and retroflexion views. Complications:            No immediate complications. Estimated blood loss:                            None. Estimated Blood Loss:     Estimated blood loss: none. Impression:               - Two 1 to 2 mm polyps in the rectum and in the                            sigmoid colon, removed with a cold snare. Resected                            and retrieved.                           - Diverticulosis in the sigmoid colon.                           - Internal hemorrhoids.                           - The examination was  otherwise normal on direct                            and retroflexion views. Recommendation:           - Repeat colonoscopy in 5 years for surveillance.                           - Patient has a contact number available for                            emergencies. The signs and symptoms of potential                            delayed complications were discussed with the                            patient. Return to normal activities tomorrow.                            Written discharge instructions were provided to the  patient.                           - Resume previous diet.                           - Continue present medications.                           - Await pathology results. Docia Chuck. Henrene Pastor, MD 04/19/2022 9:23:31 AM This report has been signed electronically.

## 2022-04-20 ENCOUNTER — Telehealth: Payer: Self-pay | Admitting: *Deleted

## 2022-04-20 NOTE — Telephone Encounter (Signed)
  Follow up Call-     04/19/2022    7:51 AM  Call back number  Post procedure Call Back phone  # [phone]  Permission to leave phone message Yes     Patient questions:  Do you have a fever, pain , or abdominal swelling? No. Pain Score  0 *  Have you tolerated food without any problems? Yes.    Have you been able to return to your normal activities? Yes.    Do you have any questions about your discharge instructions: Diet   No. Medications  No. Follow up visit  No.  Do you have questions or concerns about your Care? No.  Actions: * If pain score is 4 or above: No action needed, pain <4.

## 2022-04-23 ENCOUNTER — Encounter: Payer: Self-pay | Admitting: Internal Medicine

## 2022-04-23 NOTE — Therapy (Signed)
OUTPATIENT PHYSICAL THERAPY CERVICAL EVALUATION   Patient Name: [name] MRN: [id number] DOB:05-29-1951, 71 y.o., male Today's Date: 04/24/2022   PT End of Session - 04/24/22 1448     Visit Number 2    Date for PT Re-Evaluation 05/29/22    Authorization Type [employer] MCR    PT Start Time 1447    PT Stop Time 1526    PT Time Calculation (min) 39 min    Activity Tolerance Patient tolerated treatment well    Behavior During Therapy [facility] for tasks assessed/performed              Past Medical History:  Diagnosis Date   Arthritis    knee, no meds   Cancer (HCC) 2020   carcinoid tumor of right lung   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   Eczema 08/08/2010   Family history of adverse reaction to anesthesia    mom- post op N/V   Hyperlipidemia    PONV (postoperative nausea and vomiting)    Sleep apnea    uses CPAP nightly   Unilateral primary osteoarthritis, right knee 07/30/2019   Past Surgical History:  Procedure Laterality Date   COLONOSCOPY     hx polyps   EYE SURGERY     lasik   INTERCOSTAL NERVE BLOCK  01/20/2019   Procedure: Intercostal Nerve Block;  Surgeon: Delight Ovens, MD;  Location: [facility] OR;  Service: Thoracic;;   KNEE ARTHROSCOPY Right    SEPTOPLASTY  1980   x 2   TONSILLECTOMY AND ADENOIDECTOMY  1960   TOTAL KNEE ARTHROPLASTY Right 07/30/2019   TOTAL KNEE ARTHROPLASTY Right 07/30/2019   Procedure: RIGHT TOTAL KNEE ARTHROPLASTY;  Surgeon: Kathryne Hitch, MD;  Location: [facility] OR;  Service: Orthopedics;  Laterality: Right;   VIDEO ASSISTED THORACOSCOPY (VATS)/WEDGE RESECTION Right 01/20/2019   Procedure: VIDEO ASSISTED THORACOSCOPY (VATS) WITH RESECTION OF RIGHT INTRAPULMONARY LYMPH NODE;  Surgeon: Delight Ovens, MD;  Location: [facility] OR;  Service: Thoracic;  Laterality: Right;   VIDEO BRONCHOSCOPY N/A 01/20/2019   Procedure: VIDEO BRONCHOSCOPY;  Surgeon: Delight Ovens, MD;  Location: [facility] OR;  Service: Thoracic;  Laterality: N/A;   Patient  Active Problem List   Diagnosis Date Noted   Neck pain 03/21/2022   Obstructive sleep apnea 01/11/2022   Positional headache 07/13/2021   Unilateral primary osteoarthritis, right knee 07/30/2019   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  This is the normal time when I have headache and he lies down and that works.  Today has not had HA yet. Having HA 3/week.  PERTINENT HISTORY:  Nothing of note  PAIN:  Are you having pain? No  PRECAUTIONS: None  WEIGHT BEARING RESTRICTIONS: No  FALLS:  Has patient fallen in last 6 months? No  LIVING ENVIRONMENT: Lives with: lives with their family Lives in: House/apartment  OCCUPATION: retired  PLOF: Independent  PATIENT GOALS: to have less pain  OBJECTIVE:   DIAGNOSTIC FINDINGS:  MRI of the brain with and without contrast was normal August 2022 03/27/22 MRI cervical spine without contrast demonstrating - At C5-6 leftward disc protrusion with severe left foraminal stenosis. - At C6-7 disc bulging, uncovertebral joint and facet hypertrophy with severe right foraminal stenosis. - At C4-5 disc bulging and facet hypertrophy with moderate left foraminal stenosis.  PATIENT SURVEYS:  FOTO next session   COGNITION: Overall  cognitive status: Within functional limits for tasks assessed  SENSATION: WFL  POSTURE: rounded shoulders and forward head  PALPATION: TTP at bil upper traps, scalenes mildly TTP, and bil suboccipitals TTP.  Tension noted throughout these areas as well  CERVICAL ROM:   Active ROM A/PROM (deg) eval  Flexion 30  Extension 55  Right lateral flexion 35  Left lateral flexion 40  Right rotation 40  Left rotation 50   (Blank rows = not tested)  UPPER EXTREMITY ROM:  WFL  UPPER EXTREMITY MMT:  WFL  CERVICAL SPECIAL TESTS:  Spurling's test: Negative and Distraction test: Negative    TODAY'S  TREATMENT:                                                                                                                      DATE: 04/24/22 Manual: cervical and upper trap STM Trigger Point Dry-Needling  Treatment instructions: Expect mild to moderate muscle soreness. S/S of pneumothorax if dry needled over a lung field, and to seek immediate medical attention should they occur. Patient verbalized understanding of these instructions and education.  Patient Consent Given: Yes Education handout provided: No verbally reviewed information and pt has had dry needling in the past Muscles treated: cervical multifidi, suboccipitals, right upper trap Electrical stimulation performed: No Parameters: N/A Treatment response/outcome: increased soft tissue length and pliability  Exercises: Sidelying rotation Side bending in sitting Cervical retraction in supine  TREATMENT:  DATE: 04/17/22 Examination completed, findings reviewed, pt educated on POC, HEP, and dry needling handout. Addaday used at bil upper traps, scalenes, and upper cervical region, session ended with heat for 5 mins at cervical spine with pt in supine. Pt reported no pain at end of session. Pt motivated to  participate in PT and agreeable to attempt recommendations.    PATIENT EDUCATION:  Education details: [id number] Person educated: Patient Education method: Explanation, Demonstration, Tactile cues, Verbal cues, and Handouts Education comprehension: verbalized understanding and returned demonstration  HOME EXERCISE PROGRAM: [id number]  ASSESSMENT:  CLINICAL IMPRESSION: Patient responded well to dry needling of the Rt upper trap and cervical muscles with increased soft tissue length and pt reports of feeling less tight.  Pt has more tension throughout the right side of cervical and upper back.  He was given stretch to add to his HEP to improve posture.  Pt would benefit from additional PT to further address deficits.     OBJECTIVE IMPAIRMENTS: decreased coordination, decreased endurance, decreased mobility, decreased strength, increased fascial restrictions, increased muscle spasms, impaired flexibility, improper body mechanics, postural dysfunction, and pain.   ACTIVITY LIMITATIONS:  pt reports he is able to complete tasks but when headaches are worse he does need to lay down to relieve them  PARTICIPATION LIMITATIONS: community activity  PERSONAL FACTORS: Fitness and Time since onset of injury/illness/exacerbation are also affecting patient's functional outcome.   REHAB POTENTIAL: Good  CLINICAL DECISION MAKING: Stable/uncomplicated  EVALUATION COMPLEXITY: Low   GOALS: Goals reviewed with patient? Yes  SHORT TERM GOALS: Target date: 05/15/2022   Pt to be I with HEP.  Baseline:  Goal status: INITIAL  2.  Pt to report no more than 1-2 headaches per week to demonstrate decreased frequency of pain and improve QOL.  Baseline:  Goal status: INITIAL  3.  Pt to report no more than 5/10 pain with headaches due to improved muscle length and postural strength.  Baseline:  Goal status: INITIAL   LONG TERM GOALS: Target date:  05/29/22  Pt to be I with advanced HEP.  Baseline:   Goal status: INITIAL  2.  Pt to report no more than 1-2 headaches per month to demonstrate decreased frequency of pain and improve QOL.  Baseline:  Goal status: INITIAL  3.  Pt to report no more than 3/10 pain with headaches due to improved muscle length and postural strength.  Baseline:  Goal status: INITIAL  4.  Pt to demonstrate normal cervical ROM without pain due to improved tissue mobility and length for decreased headaches..  Baseline:  Goal status: INITIAL  5.  Pt to demonstrate ability to maintain upright posture for at least 30 mins without pain due to improved strength on postural muscles.  Baseline:  Goal status: INITIAL   PLAN:  PT FREQUENCY: 1-2x/week  PT DURATION: 6 weeks  PLANNED INTERVENTIONS: Therapeutic exercises, Therapeutic activity, Neuromuscular re-education, Patient/Family education, Self Care, Joint mobilization, Joint manipulation, Dry Needling, Electrical stimulation, Spinal mobilization, Cryotherapy, Moist heat, scar mobilization, Taping, Traction, Ultrasound, Ionotophoresis 4mg /ml Dexamethasone, and Manual therapy  PLAN FOR NEXT SESSION: posture strengthening bands, cervical stretching, f/u on cervical dry needling #1  Russella Dar, PT 04/24/22 3:34 PM

## 2022-04-24 ENCOUNTER — Ambulatory Visit: Payer: Medicare Other | Admitting: Physical Therapy

## 2022-04-24 ENCOUNTER — Encounter: Payer: Self-pay | Admitting: Physical Therapy

## 2022-04-24 DIAGNOSIS — M542 Cervicalgia: Secondary | ICD-10-CM | POA: Diagnosis not present

## 2022-04-24 DIAGNOSIS — M62838 Other muscle spasm: Secondary | ICD-10-CM

## 2022-04-24 DIAGNOSIS — R279 Unspecified lack of coordination: Secondary | ICD-10-CM

## 2022-04-24 DIAGNOSIS — M6281 Muscle weakness (generalized): Secondary | ICD-10-CM

## 2022-05-01 NOTE — Therapy (Unsigned)
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   Eczema 08/08/2010   Family history of adverse reaction to anesthesia    mom- post op N/V   Hyperlipidemia    PONV (postoperative nausea and vomiting)    Sleep apnea    uses CPAP nightly   Unilateral primary osteoarthritis, right knee 07/30/2019   Past Surgical History:  Procedure Laterality Date   COLONOSCOPY     hx polyps   EYE SURGERY     lasik   INTERCOSTAL NERVE BLOCK  01/20/2019   Procedure: Intercostal Nerve Block;  Surgeon: Grace Isaac, MD;  Location: [place];  Service: Thoracic;;   KNEE ARTHROSCOPY Right    SEPTOPLASTY  1980   x 2   TONSILLECTOMY AND ADENOIDECTOMY  1960   TOTAL KNEE ARTHROPLASTY Right 07/30/2019   TOTAL KNEE ARTHROPLASTY Right 07/30/2019   Procedure: RIGHT TOTAL KNEE ARTHROPLASTY;  Surgeon: Mcarthur Rossetti, MD;  Location: [place];  Service: Orthopedics;  Laterality: Right;   VIDEO ASSISTED THORACOSCOPY (VATS)/WEDGE RESECTION Right 01/20/2019   Procedure: VIDEO ASSISTED THORACOSCOPY (VATS) WITH RESECTION OF RIGHT INTRAPULMONARY LYMPH NODE;  Surgeon: Grace Isaac, MD;  Location: [place];  Service: Thoracic;  Laterality: Right;   VIDEO BRONCHOSCOPY N/A 01/20/2019   Procedure: VIDEO BRONCHOSCOPY;  Surgeon: Grace Isaac, MD;  Location: [place];  Service: Thoracic;  Laterality: N/A;   Patient Active Problem List   Diagnosis Date Noted   Neck pain 03/21/2022   Obstructive sleep apnea 01/11/2022   Positional headache 07/13/2021   Unilateral primary osteoarthritis, right knee 07/30/2019   Status post total right knee replacement 07/30/2019   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  Rationale for Evaluation and Treatment Rehabilitation  ONSET DATE: spring 2023  SUBJECTIVE:                                                                                                                                                                                                         SUBJECTIVE STATEMENT: Pt reports  he is the same today.  This is the normal time when I have headache and he lies down and that works.  Today has not had HA yet. Having HA 3/week.  PERTINENT HISTORY:  Nothing of note  PAIN:  Are you having pain? No  PRECAUTIONS: None  WEIGHT BEARING RESTRICTIONS: No  FALLS:  Has patient fallen in last 6 months? No  LIVING ENVIRONMENT: Lives with: lives with their family Lives in: House/apartment  OCCUPATION: retired  PLOF: Independent  PATIENT GOALS: to have less pain  OBJECTIVE:   DIAGNOSTIC FINDINGS:  MRI of the brain with and without contrast was normal August 2022 03/27/22 MRI cervical spine without contrast demonstrating - At C5-6 leftward disc protrusion with severe left foraminal stenosis. - At C6-7 disc bulging, uncovertebral joint and facet hypertrophy with severe right foraminal stenosis. - At C4-5 disc bulging and facet hypertrophy with moderate left foraminal stenosis.  PATIENT SURVEYS:  FOTO next session   COGNITION: Overall cognitive status: Within functional limits for tasks assessed  SENSATION: WFL  POSTURE: rounded shoulders and forward head  PALPATION: TTP at bil upper traps, scalenes mildly TTP, and bil suboccipitals TTP.  Tension noted throughout these areas as well  CERVICAL ROM:   Active ROM A/PROM (deg) eval  Flexion 30  Extension 55  Right lateral flexion 35  Left lateral flexion 40  Right rotation 40  Left rotation  50   (Blank rows = not tested)  UPPER EXTREMITY ROM:  WFL  UPPER EXTREMITY MMT:  WFL  CERVICAL SPECIAL TESTS:  Spurling's test: Negative and Distraction test: Negative    TODAY'S  TREATMENT:                                                                                                                      DATE: 04/24/22 Manual: cervical and upper trap STM Trigger Point Dry-Needling  Treatment instructions: Expect mild to moderate muscle soreness. S/S of pneumothorax if dry needled over a lung field, and to seek immediate medical attention should they occur. Patient verbalized understanding of these instructions and education.  Patient Consent Given: Yes Education handout provided: No verbally reviewed information and pt has had dry needling in the past Muscles treated: cervical multifidi, suboccipitals, right upper trap Electrical stimulation performed: No Parameters: N/A Treatment response/outcome: increased soft tissue length and pliability  Exercises: Sidelying rotation Side bending in sitting Cervical retraction in supine  TREATMENT:  DATE: 04/17/22 Examination completed, findings reviewed, pt educated on POC, HEP, and dry needling handout. Addaday used at bil upper traps, scalenes, and upper cervical region, session ended with heat for 5 mins at cervical spine with pt in supine. Pt reported no pain at end of session. Pt motivated to participate in PT and agreeable to attempt recommendations.    PATIENT EDUCATION:  Education details: [id number] Person educated: Patient Education method: Explanation, Demonstration, Tactile cues, Verbal cues, and Handouts Education comprehension: verbalized understanding and returned demonstration  HOME EXERCISE PROGRAM: [id number]  ASSESSMENT:  CLINICAL IMPRESSION: Patient responded well to dry needling of the Rt upper  trap and cervical muscles with increased soft tissue length and pt reports of feeling less tight.  Pt has more tension throughout the right side of cervical and upper back.  He was given stretch to add to his HEP to improve posture.  Pt would benefit from additional PT to further address deficits.     OBJECTIVE IMPAIRMENTS: decreased coordination, decreased endurance, decreased mobility, decreased strength, increased fascial restrictions, increased muscle spasms, impaired flexibility, improper body mechanics, postural dysfunction, and pain.   ACTIVITY LIMITATIONS:  pt reports he is able to complete tasks but when headaches are worse he does need to lay down to relieve them  PARTICIPATION LIMITATIONS: community activity  PERSONAL FACTORS: Fitness and Time since onset of injury/illness/exacerbation are also affecting patient's functional outcome.   REHAB POTENTIAL: Good  CLINICAL DECISION MAKING: Stable/uncomplicated  EVALUATION COMPLEXITY: Low   GOALS: Goals reviewed with patient? Yes  SHORT TERM GOALS: Target date: 05/15/2022   Pt to be I with HEP.  Baseline:  Goal status: INITIAL  2.  Pt to report no more than 1-2 headaches per week to demonstrate decreased frequency of pain and improve QOL.  Baseline:  Goal status: INITIAL  3.  Pt to report no more than 5/10 pain with headaches due to improved muscle length and postural strength.  Baseline:  Goal status: INITIAL   LONG TERM GOALS: Target date:  05/29/22  Pt to be I with advanced HEP.  Baseline:  Goal status: INITIAL  2.  Pt to report no more than 1-2 headaches per month to demonstrate decreased frequency of pain and improve QOL.  Baseline:  Goal status: INITIAL  3.  Pt to report no more than 3/10 pain with headaches due to improved muscle length and postural strength.  Baseline:  Goal status: INITIAL  4.  Pt to demonstrate normal cervical ROM without pain due to improved tissue mobility and length for decreased  headaches..  Baseline:  Goal status: INITIAL  5.  Pt to demonstrate ability to maintain upright posture for at least 30 mins without pain due to improved strength on postural muscles.  Baseline:  Goal status: INITIAL   PLAN:  PT FREQUENCY: 1-2x/week  PT DURATION: 6 weeks  PLANNED INTERVENTIONS: Therapeutic exercises, Therapeutic activity, Neuromuscular re-education, Patient/Family education, Self Care, Joint mobilization, Joint manipulation, Dry Needling, Electrical stimulation, Spinal mobilization, Cryotherapy, Moist heat, scar mobilization, Taping, Traction, Ultrasound, Ionotophoresis '4mg'$ /ml Dexamethasone, and Manual therapy  PLAN FOR NEXT SESSION: posture strengthening bands, cervical stretching, f/u on cervical dry needling #1  Rudi Heap PT, DPT 05/01/22  2:35 PM

## 2022-05-03 ENCOUNTER — Ambulatory Visit: Payer: Medicare Other | Attending: Neurology | Admitting: Physical Therapy

## 2022-05-03 DIAGNOSIS — M62838 Other muscle spasm: Secondary | ICD-10-CM | POA: Diagnosis not present

## 2022-05-03 DIAGNOSIS — M6281 Muscle weakness (generalized): Secondary | ICD-10-CM | POA: Diagnosis present

## 2022-05-03 DIAGNOSIS — R279 Unspecified lack of coordination: Secondary | ICD-10-CM | POA: Insufficient documentation

## 2022-05-03 DIAGNOSIS — M542 Cervicalgia: Secondary | ICD-10-CM | POA: Insufficient documentation

## 2022-05-03 NOTE — Therapy (Signed)
OUTPATIENT PHYSICAL THERAPY CERVICAL EVALUATION   Patient Name: [name] MRN: [id number] DOB:Apr 21, 1951, 71 y.o., male Today's Date: 05/03/2022   PT End of Session - 05/03/22 1118     Visit Number 3    Date for PT Re-Evaluation 05/29/22    Authorization Type [employer] MCR    PT Start Time 0071    PT Stop Time 1055    PT Time Calculation (min) 40 min    Activity Tolerance Patient tolerated treatment well    Behavior During Therapy WFL for tasks assessed/performed            Past Medical History:  Diagnosis Date   Arthritis    knee, no meds   Cancer ([place]) 2020   carcinoid tumor of right lung   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   Eczema 08/08/2010   Family history of adverse reaction to anesthesia    mom- post op N/V   Hyperlipidemia    PONV (postoperative nausea and vomiting)    Sleep apnea    uses CPAP nightly   Unilateral primary osteoarthritis, right knee 07/30/2019   Past Surgical History:  Procedure Laterality Date   COLONOSCOPY     hx polyps   EYE SURGERY     lasik   INTERCOSTAL NERVE BLOCK  01/20/2019   Procedure: Intercostal Nerve Block;  Surgeon: Grace Isaac, MD;  Location: [place];  Service: Thoracic;;   KNEE ARTHROSCOPY Right    SEPTOPLASTY  1980   x 2   TONSILLECTOMY AND ADENOIDECTOMY  1960   TOTAL KNEE ARTHROPLASTY Right 07/30/2019   TOTAL KNEE ARTHROPLASTY Right 07/30/2019   Procedure: RIGHT TOTAL KNEE ARTHROPLASTY;  Surgeon: Mcarthur Rossetti, MD;  Location: [place];  Service: Orthopedics;  Laterality: Right;   VIDEO ASSISTED THORACOSCOPY (VATS)/WEDGE RESECTION Right 01/20/2019   Procedure: VIDEO ASSISTED THORACOSCOPY (VATS) WITH RESECTION OF RIGHT INTRAPULMONARY LYMPH NODE;  Surgeon: Grace Isaac, MD;  Location: [place];  Service: Thoracic;  Laterality: Right;   VIDEO BRONCHOSCOPY N/A 01/20/2019   Procedure: VIDEO BRONCHOSCOPY;  Surgeon: Grace Isaac, MD;  Location: [place];  Service: Thoracic;  Laterality: N/A;   Patient Active  Problem List   Diagnosis Date Noted   Neck pain 03/21/2022   Obstructive sleep apnea 01/11/2022   Positional headache 07/13/2021   Unilateral primary osteoarthritis, right knee 07/30/2019   Status post total right knee replacement 07/30/2019   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 Mostly noted in afternoon after doing yard or house work.   PERTINENT HISTORY:  Nothing of note  PAIN:  Are you having pain? No  PRECAUTIONS: None  WEIGHT BEARING RESTRICTIONS: No  FALLS:  Has patient fallen in last 6 months? No  LIVING ENVIRONMENT: Lives with: lives with their family Lives in: House/apartment  OCCUPATION: retired  PLOF: Independent  PATIENT GOALS: to have less pain  OBJECTIVE:   DIAGNOSTIC FINDINGS:  MRI of the brain with and without contrast was normal August 2022 03/27/22 MRI cervical spine without contrast demonstrating - At C5-6 leftward disc protrusion with severe left foraminal stenosis. - At C6-7 disc bulging, uncovertebral joint and facet hypertrophy with severe right foraminal stenosis. - At C4-5 disc bulging and facet hypertrophy with moderate left foraminal stenosis.  PATIENT SURVEYS:  FOTO next session   COGNITION: Overall cognitive status: Within functional limits for tasks  assessed  SENSATION: WFL  POSTURE: rounded shoulders and forward head  PALPATION: TTP at bil upper traps, scalenes mildly TTP, and bil suboccipitals TTP.  Tension noted throughout these areas as well  CERVICAL ROM:   Active ROM A/PROM (deg) eval  Flexion 30  Extension 55  Right lateral flexion 35  Left lateral flexion 40  Right rotation 40  Left rotation 50   (Blank rows = not tested)  UPPER EXTREMITY ROM:  WFL  UPPER EXTREMITY MMT:  [facility]  CERVICAL SPECIAL TESTS:  Spurling's test: Negative and Distraction test: Negative    TODAY'S  TREATMENT:      Date: 05/03/2022 Manual: cervical and upper trap STM, high velocity, low amplitude to thoracic spine.  Trigger Point Dry-Needling  Treatment instructions: Expect mild to moderate muscle soreness. S/S of pneumothorax if dry needled over a lung field, and to seek immediate medical attention should they occur. Patient verbalized understanding of these instructions and education.  Patient Consent Given: Yes Education handout provided: No verbally reviewed information and pt has had dry needling in the past Muscles treated: cervical multifidi, suboccipitals, right upper trap Electrical stimulation performed: Yes to R UT only  Parameters: low frequency, low/mod intensity.  Treatment response/outcome: increased soft tissue length and pliability  Exercises: Chin tucks  Chin tucks with rotation at end range.  Open books Seated rotation Seated thoracic extension over chair                                                                                                           DATE: 04/24/22 Manual: cervical and upper trap STM Trigger Point Dry-Needling  Treatment instructions: Expect mild to moderate muscle soreness. S/S of pneumothorax if dry needled over a lung field, and to seek immediate medical attention should they occur. Patient verbalized understanding of these instructions and education.  Patient Consent Given:  Yes Education handout provided: No verbally reviewed information and pt has had dry needling in the past Muscles treated: cervical multifidi, suboccipitals, right upper trap Electrical stimulation performed: No Parameters: N/A Treatment response/outcome: increased soft tissue length  and pliability  Exercises: Sidelying rotation Side bending in sitting Cervical retraction in supine  TREATMENT:                                                                                                                      DATE: 04/17/22 Examination completed, findings reviewed, pt educated on POC, HEP, and dry needling handout. Addaday used at bil upper traps, scalenes, and upper cervical region, session ended with heat for 5 mins at cervical spine with pt in supine. Pt reported no pain at end of session. Pt motivated to participate in PT and agreeable to attempt recommendations.    PATIENT EDUCATION:  Education details: [id number] Person educated: Patient Education method: Explanation, Demonstration, Tactile cues, Verbal cues, and Handouts Education comprehension: verbalized understanding and returned demonstration  HOME EXERCISE PROGRAM: [id number]  ASSESSMENT:  CLINICAL IMPRESSION: Patient responded well to dry needling with E-stim to R UT only and cervical muscles with increased soft tissue length and pt reports of feeling less tight. Pt has more tension throughout the right side of cervical and upper back. Pt has significant stiffness noted throughout thoracic spine upon further assessment. Pt tolerated session well with no adverse effects. Pt would benefit from additional PT to further address deficits.     OBJECTIVE IMPAIRMENTS: decreased coordination, decreased endurance, decreased mobility, decreased strength, increased fascial restrictions, increased muscle spasms, impaired flexibility, improper body mechanics, postural dysfunction, and pain.   ACTIVITY LIMITATIONS:  pt reports he is able to  complete tasks but when headaches are worse he does need to lay down to relieve them  PARTICIPATION LIMITATIONS: community activity  PERSONAL FACTORS: Fitness and Time since onset of injury/illness/exacerbation are also affecting patient's functional outcome.   REHAB POTENTIAL: Good  CLINICAL DECISION MAKING: Stable/uncomplicated  EVALUATION COMPLEXITY: Low   GOALS: Goals reviewed with patient? Yes  SHORT TERM GOALS: Target date: 05/15/2022   Pt to be I with HEP.  Baseline:  Goal status: INITIAL  2.  Pt to report no more than 1-2 headaches per week to demonstrate decreased frequency of pain and improve QOL.  Baseline:  Goal status: INITIAL  3.  Pt to report no more than 5/10 pain with headaches due to improved muscle length and postural strength.  Baseline:  Goal status: INITIAL   LONG TERM GOALS: Target date:  05/29/22  Pt to be I with advanced HEP.  Baseline:  Goal status: INITIAL  2.  Pt to report no more than 1-2 headaches per month to demonstrate decreased frequency of pain and improve QOL.  Baseline:  Goal status: INITIAL  3.  Pt to report no more than 3/10 pain with headaches due to improved muscle length and postural strength.  Baseline:  Goal status: INITIAL  4.  Pt to demonstrate normal cervical ROM without pain due to improved tissue mobility and length for decreased headaches..  Baseline:  Goal status: INITIAL  5.  Pt to demonstrate ability to maintain upright posture for at least 30 mins without pain due  to improved strength on postural muscles.  Baseline:  Goal status: INITIAL   PLAN:  PT FREQUENCY: 1-2x/week  PT DURATION: 6 weeks  PLANNED INTERVENTIONS: Therapeutic exercises, Therapeutic activity, Neuromuscular re-education, Patient/Family education, Self Care, Joint mobilization, Joint manipulation, Dry Needling, Electrical stimulation, Spinal mobilization, Cryotherapy, Moist heat, scar mobilization, Taping, Traction, Ultrasound,  Ionotophoresis '4mg'$ /ml Dexamethasone, and Manual therapy  PLAN FOR NEXT SESSION: posture strengthening bands, cervical stretching, f/u on cervical dry needling #1  Rudi Heap PT, DPT 05/03/22  11:22 AM

## 2022-05-10 NOTE — Therapy (Signed)
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   Eczema 08/08/2010   Family history of adverse reaction to anesthesia    mom- post op N/V   Hyperlipidemia    PONV (postoperative nausea and vomiting)    Sleep apnea    uses CPAP nightly   Unilateral primary osteoarthritis, right knee 07/30/2019   Past Surgical History:  Procedure Laterality Date   COLONOSCOPY     hx polyps   EYE SURGERY     lasik   INTERCOSTAL NERVE BLOCK  01/20/2019   Procedure: Intercostal Nerve Block;  Surgeon: Grace Isaac, MD;  Location: [facility];  Service: Thoracic;;   KNEE ARTHROSCOPY Right    SEPTOPLASTY  1980   x 2   TONSILLECTOMY AND ADENOIDECTOMY  1960   TOTAL KNEE ARTHROPLASTY Right 07/30/2019   TOTAL KNEE ARTHROPLASTY Right 07/30/2019   Procedure: RIGHT TOTAL KNEE ARTHROPLASTY;  Surgeon: Mcarthur Rossetti, MD;  Location: [facility];  Service: Orthopedics;  Laterality: Right;   VIDEO ASSISTED THORACOSCOPY (VATS)/WEDGE RESECTION Right 01/20/2019   Procedure: VIDEO ASSISTED THORACOSCOPY (VATS) WITH RESECTION OF RIGHT INTRAPULMONARY LYMPH NODE;  Surgeon: Grace Isaac, MD;  Location: [place];  Service: Thoracic;  Laterality: Right;   VIDEO BRONCHOSCOPY N/A 01/20/2019   Procedure: VIDEO BRONCHOSCOPY;  Surgeon: Grace Isaac, MD;  Location: [place];  Service: Thoracic;  Laterality: N/A;   Patient Active Problem List    Diagnosis Date Noted   Neck pain 03/21/2022   Obstructive sleep apnea 01/11/2022   Positional headache 07/13/2021   Unilateral primary osteoarthritis, right knee 07/30/2019   Status post total right knee replacement 07/30/2019   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 Mostly noted in afternoon after doing yard or house work.   PERTINENT HISTORY:  Nothing of note  PAIN:  Are you having pain? No  PRECAUTIONS: None  WEIGHT BEARING RESTRICTIONS: No  FALLS:  Has patient fallen in last 6 months? No  LIVING ENVIRONMENT: Lives with: lives with their family Lives in: House/apartment  OCCUPATION: retired  PLOF: Independent  PATIENT GOALS: to have less pain  OBJECTIVE:   DIAGNOSTIC FINDINGS:  MRI of the brain with and without contrast was normal August 2022 03/27/22 MRI cervical spine without contrast demonstrating - At C5-6 leftward disc protrusion with severe left foraminal stenosis. - At C6-7 disc bulging, uncovertebral joint and facet hypertrophy with severe right foraminal stenosis. - At C4-5 disc bulging and facet hypertrophy with moderate left foraminal stenosis.  PATIENT SURVEYS:  FOTO next session   COGNITION: Overall cognitive status: Within functional limits for tasks  assessed  SENSATION: WFL  POSTURE: rounded shoulders and forward head  PALPATION: TTP at bil upper traps, scalenes mildly TTP, and bil suboccipitals TTP.  Tension noted throughout these areas as well  CERVICAL ROM:   Active ROM A/PROM (deg) eval  Flexion 30  Extension 55  Right lateral flexion 35  Left lateral flexion 40  Right rotation 40  Left rotation 50   (Blank rows = not tested)  UPPER EXTREMITY ROM:  WFL  UPPER EXTREMITY MMT:  [facility]  CERVICAL SPECIAL TESTS:  Spurling's test: Negative and Distraction test: Negative    TODAY'S  TREATMENT:      Date: 05/11/2022 Manual: cervical and upper trap STM, high velocity, low amplitude to thoracic spine.  Trigger Point Dry-Needling  Treatment instructions: Expect mild to moderate muscle soreness. S/S of pneumothorax if dry needled over a lung field, and to seek immediate medical attention should they occur. Patient verbalized understanding of these instructions and education.  Patient Consent Given: Yes Education handout provided: No verbally reviewed information and pt has had dry needling in the past Muscles treated: cervical multifidi, suboccipitals, right upper trap Electrical stimulation performed: Yes to R UT only  Parameters: low frequency, low/mod intensity.  Treatment response/outcome: increased soft tissue length and pliability  Exercises: Prone scap retraction Open books Rows with green theraband  Shoulder extension with green therabnd Bilat ER with green theraband.   Moist heat at end of session for 5 min.    Date: 05/03/2022 Manual: cervical and upper trap STM, high velocity, low amplitude to thoracic spine.  Trigger Point Dry-Needling  Treatment instructions: Expect mild to moderate muscle soreness. S/S of pneumothorax if dry needled over a lung field, and to seek immediate medical attention should they occur. Patient verbalized understanding of these instructions and education.  Patient Consent  Given: Yes Education handout provided: No verbally reviewed information and pt has had dry needling in the past Muscles treated: cervical multifidi, suboccipitals, right upper trap Electrical stimulation performed: Yes to R UT only  Parameters: low frequency, low/mod intensity.  Treatment response/outcome: increased soft tissue length and pliability  Exercises: Chin tucks  Chin tucks with rotation at end range.  Open books Seated rotation Seated thoracic extension over chair  DATE: 04/24/22 Manual: cervical and upper trap STM Trigger Point Dry-Needling  Treatment instructions: Expect mild to moderate muscle soreness. S/S of pneumothorax if dry needled over a lung field, and to seek immediate medical attention should they occur. Patient verbalized understanding of these instructions and education.  Patient Consent Given: Yes Education handout provided: No verbally reviewed information and pt has had dry needling in the past Muscles treated: cervical multifidi, suboccipitals, right upper trap Electrical stimulation performed: No Parameters: N/A Treatment response/outcome: increased soft tissue length and pliability  Exercises: Sidelying rotation Side bending in sitting Cervical retraction in supine   PATIENT EDUCATION:  Education details: [id number] Person educated: Patient Education method: Consulting civil engineer, Demonstration, Tactile cues, Verbal cues, and Handouts Education comprehension: verbalized understanding and returned demonstration  HOME EXERCISE PROGRAM: [email or web address]  ASSESSMENT:  CLINICAL IMPRESSION: Patient responded well to dry needling to R UT only and cervical muscles with increased soft tissue length and pt reports of feeling less tight. Pt had multiple twitches in her R UT. Pt has significant stiffness noted throughout thoracic spine. Pt tolerated progressions well  with no adverse effects.  He requires cues for proper forum due to overuse of UT's. Pt would benefit from additional PT to further address deficits.     OBJECTIVE IMPAIRMENTS: decreased coordination, decreased endurance, decreased mobility, decreased strength, increased fascial restrictions, increased muscle spasms, impaired flexibility, improper body mechanics, postural dysfunction, and pain.   ACTIVITY LIMITATIONS:  pt reports he is able to complete tasks but when headaches are worse he does need to lay down to relieve them  PARTICIPATION LIMITATIONS: community activity  PERSONAL FACTORS: Fitness and Time since onset of injury/illness/exacerbation are also affecting patient's functional outcome.   REHAB POTENTIAL: Good  CLINICAL DECISION MAKING: Stable/uncomplicated  EVALUATION COMPLEXITY: Low   GOALS: Goals reviewed with patient? Yes  SHORT TERM GOALS: Target date: 05/15/2022   Pt to be I with HEP.  Baseline:  Goal status: INITIAL  2.  Pt to report no more than 1-2 headaches per week to demonstrate decreased frequency of pain and improve QOL.  Baseline:  Goal status: INITIAL  3.  Pt to report no more than 5/10 pain with headaches due to improved muscle length and postural strength.  Baseline:  Goal status: INITIAL   LONG TERM GOALS: Target date:  05/29/22  Pt to be I with advanced HEP.  Baseline:  Goal status: INITIAL  2.  Pt to report no more than 1-2 headaches per month to demonstrate decreased frequency of pain and improve QOL.  Baseline:  Goal status: INITIAL  3.  Pt to report no more than 3/10 pain with headaches due to improved muscle length and postural strength.  Baseline:  Goal status: INITIAL  4.  Pt to demonstrate normal cervical ROM without pain due to improved tissue mobility and length for decreased headaches..  Baseline:  Goal status: INITIAL  5.  Pt to demonstrate ability to maintain upright posture for at least 30 mins without pain due to  improved strength on postural muscles.  Baseline:  Goal status: INITIAL   PLAN:  PT FREQUENCY: 1-2x/week  PT DURATION: 6 weeks  PLANNED INTERVENTIONS: Therapeutic exercises, Therapeutic activity, Neuromuscular re-education, Patient/Family education, Self Care, Joint mobilization, Joint manipulation, Dry Needling, Electrical stimulation, Spinal mobilization, Cryotherapy, Moist heat, scar mobilization, Taping, Traction, Ultrasound, Ionotophoresis '4mg'$ /ml Dexamethasone, and Manual therapy  PLAN FOR NEXT SESSION: posture strengthening bands, cervical stretching, f/u on cervical dry needling #1  Rudi Heap PT, DPT 05/11/22  11:14 AM

## 2022-05-11 ENCOUNTER — Ambulatory Visit: Payer: Medicare Other | Admitting: Physical Therapy

## 2022-05-11 ENCOUNTER — Encounter: Payer: Self-pay | Admitting: Physical Therapy

## 2022-05-11 DIAGNOSIS — M6281 Muscle weakness (generalized): Secondary | ICD-10-CM

## 2022-05-11 DIAGNOSIS — R279 Unspecified lack of coordination: Secondary | ICD-10-CM

## 2022-05-11 DIAGNOSIS — M542 Cervicalgia: Secondary | ICD-10-CM

## 2022-05-11 DIAGNOSIS — M62838 Other muscle spasm: Secondary | ICD-10-CM | POA: Diagnosis not present

## 2022-05-23 NOTE — Therapy (Unsigned)
OUTPATIENT PHYSICAL THERAPY CERVICAL TREATMENT   Patient Name: [name] MRN: [id number] DOB:June 25, 1951, 71 y.o., male Today's Date: 05/24/2022   PT End of Session - 05/24/22 0924     Visit Number 5    Date for PT Re-Evaluation 07/19/22    Authorization Type U[employer] MCR    PT Start Time 0853    PT Stop Time 0933    PT Time Calculation (min) 40 min    Activity Tolerance Patient tolerated treatment well    Behavior During Therapy [facility] for tasks assessed/performed              Past Medical History:  Diagnosis Date   Arthritis    knee, no meds   Cancer ([place]) 2020   carcinoid tumor of right lung   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   Eczema 08/08/2010   Family history of adverse reaction to anesthesia    mom- post op N/V   Hyperlipidemia    PONV (postoperative nausea and vomiting)    Sleep apnea    uses CPAP nightly   Unilateral primary osteoarthritis, right knee 07/30/2019   Past Surgical History:  Procedure Laterality Date   COLONOSCOPY     hx polyps   EYE SURGERY     lasik   INTERCOSTAL NERVE BLOCK  01/20/2019   Procedure: Intercostal Nerve Block;  Surgeon: Grace Isaac, MD;  Location: [place];  Service: Thoracic;;   KNEE ARTHROSCOPY Right    SEPTOPLASTY  1980   x 2   TONSILLECTOMY AND ADENOIDECTOMY  1960   TOTAL KNEE ARTHROPLASTY Right 07/30/2019   TOTAL KNEE ARTHROPLASTY Right 07/30/2019   Procedure: RIGHT TOTAL KNEE ARTHROPLASTY;  Surgeon: Mcarthur Rossetti, MD;  Location: [place];  Service: Orthopedics;  Laterality: Right;   VIDEO ASSISTED THORACOSCOPY (VATS)/WEDGE RESECTION Right 01/20/2019   Procedure: VIDEO ASSISTED THORACOSCOPY (VATS) WITH RESECTION OF RIGHT INTRAPULMONARY LYMPH NODE;  Surgeon: Grace Isaac, MD;  Location: [place];  Service: Thoracic;  Laterality: Right;   VIDEO BRONCHOSCOPY N/A 01/20/2019   Procedure: VIDEO BRONCHOSCOPY;  Surgeon: Grace Isaac, MD;  Location: [place];  Service: Thoracic;  Laterality: N/A;   Patient  Active Problem List   Diagnosis Date Noted   Neck pain 03/21/2022   Obstructive sleep apnea 01/11/2022   Positional headache 07/13/2021   Unilateral primary osteoarthritis, right knee 07/30/2019   Status post total right knee replacement 07/30/2019   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  I am 50% improved overall.  PERTINENT HISTORY:  Nothing of note  PAIN:  Are you having pain? No  PRECAUTIONS: None  WEIGHT BEARING RESTRICTIONS: No  FALLS:  Has patient fallen in last 6 months? No  LIVING ENVIRONMENT: Lives with: lives with their family Lives in: House/apartment  OCCUPATION: retired  PLOF: Independent  PATIENT GOALS: to have less pain  OBJECTIVE:   DIAGNOSTIC FINDINGS:  MRI of the brain with and without contrast was normal August 2022 03/27/22 MRI cervical spine without contrast demonstrating - At C5-6 leftward disc protrusion with severe left foraminal stenosis. - At C6-7 disc bulging, uncovertebral joint and facet hypertrophy with severe right foraminal stenosis. - At C4-5 disc bulging and facet hypertrophy with moderate left foraminal stenosis.  PATIENT SURVEYS:  FOTO next session   COGNITION: Overall cognitive status: Within functional limits for tasks assessed  SENSATION: WFL  POSTURE: rounded  shoulders and forward head  PALPATION: TTP at bil upper traps, scalenes mildly TTP, and bil suboccipitals TTP.  Tension noted throughout these areas as well  CERVICAL ROM:   Active ROM A/PROM (deg) eval  Flexion 30  Extension 55  Right lateral flexion 35  Left lateral flexion 40  Right rotation 40  Left rotation 50   (Blank rows = not tested)  UPPER EXTREMITY ROM:  WFL  UPPER EXTREMITY MMT:  WFL  CERVICAL SPECIAL TESTS:  Spurling's test: Negative and Distraction test: Negative    TODAY'S  TREATMENT:      Date: 05/24/2022 Manual: cervical and upper trap STM, thoracic spine  Trigger Point Dry-Needling  Treatment instructions: Expect mild to moderate muscle soreness. S/S of pneumothorax if dry needled over a lung field, and to seek immediate medical attention should they occur. Patient verbalized understanding of these instructions and education.  Patient Consent Given: Yes Education handout provided: No verbally reviewed information and pt has had dry needling in the past Muscles treated: cervical multifidi, suboccipitals, right upper trap Electrical stimulation performed: Yes to R UT only  Parameters: low frequency, low/mod intensity.  Treatment response/outcome: increased soft tissue length and pliability  Exercises: Standing W scap retraction leaning on ball - 20x Single knee to chest 3 x 10 sec Horizontal abduction with green theraband at wall - 20x Shoulder flexion with post pelvic tilt maintained - 20x Bilat ER with blue theraband 20x   TREATMENT:      Date: 05/11/2022 Manual: cervical and upper trap STM, high velocity, low amplitude to thoracic spine.  Trigger Point Dry-Needling  Treatment instructions: Expect mild to moderate muscle soreness. S/S of pneumothorax if dry needled over a lung field, and to seek immediate medical attention should they occur. Patient verbalized understanding of these instructions and education.  Patient Consent Given:  Yes Education handout provided: No verbally reviewed information and pt has had dry needling in the past Muscles treated: cervical multifidi, suboccipitals, right upper trap Electrical stimulation performed: Yes to R UT only  Parameters: low frequency, low/mod intensity.  Treatment response/outcome: increased soft tissue length and pliability  Exercises: Prone scap retraction Open books Rows with green theraband  Shoulder extension with green therabnd Bilat ER with green theraband.   Moist heat at end of session for 5 min.    Date: 05/03/2022 Manual: cervical and upper trap STM, high velocity, low amplitude to thoracic spine.  Trigger Point Dry-Needling  Treatment instructions: Expect mild to moderate muscle soreness. S/S of pneumothorax if dry needled over  a lung field, and to seek immediate medical attention should they occur. Patient verbalized understanding of these instructions and education.  Patient Consent Given: Yes Education handout provided: No verbally reviewed information and pt has had dry needling in the past Muscles treated: cervical multifidi, suboccipitals, right upper trap Electrical stimulation performed: Yes to R UT only  Parameters: low frequency, low/mod intensity.  Treatment response/outcome: increased soft tissue length and pliability  Exercises: Chin tucks  Chin tucks with rotation at end range.  Open books Seated rotation Seated thoracic extension over chair   PATIENT EDUCATION:  Education details: [id number] Person educated: Patient Education method: Explanation, Demonstration, Tactile cues, Verbal cues, and Handouts Education comprehension: verbalized understanding and returned demonstration  HOME EXERCISE PROGRAM: Access Code: [email or web address] URL: [email or web address] Date: 05/24/2022 Prepared by: Jari Favre  Exercises - Seated Passive Cervical Retraction  - 1 x daily - 7 x weekly - 1 sets - 10 reps - Seated Cervical  Flexion AROM  - 1 x daily - 7 x weekly - 1 sets - 3 reps - 30s holds - Seated Cervical Extension AROM  - 1 x daily - 7 x weekly - 1 sets - 3 reps - 30s holds - Seated Levator Scapulae Stretch  - 1 x daily - 7 x weekly - 1 sets - 3 reps - 30s holds - Seated Cervical Sidebending Stretch  - 1 x daily - 7 x weekly - 1 sets - 3 reps - 30s holds - Doorway Pec Stretch at 90 Degrees Abduction  - 1 x daily - 7 x weekly - 1 sets - 3 reps - 30s holds - Sidelying Thoracic Rotation with Open Book  - 1 x daily - 7 x weekly - 1 sets - 5 reps - 10 sec hold - Posterior Pelvic Tilt at [place]  - 1 x daily - 7 x weekly - 3 sets - 10 reps - Supine Single Knee to Chest Stretch  - 2 x daily - 7 x weekly - 1 sets - 5 reps - 10 sec hold  ASSESSMENT:  CLINICAL IMPRESSION: Pt is responding well to  PT and feels 50% improved overall.  Pt is having less frequent and intense headaches.  Pt still expected to make progress and is recommended to continue. He needed guidance with posture needing cues to keep pelvis from anterior rotation in order to improve length and spine position for improved stacking of cervical spine.  Pt demonstrates weakness of core and scapular stability needing skilled PT to improve on impairments and reduce occurrence of headaches.  All goals updated as seen below.   OBJECTIVE IMPAIRMENTS: decreased coordination, decreased endurance, decreased mobility, decreased strength, increased fascial restrictions, increased muscle spasms, impaired flexibility, improper body mechanics, postural dysfunction, and pain.   ACTIVITY LIMITATIONS:  pt reports he is able to complete tasks but when headaches are worse he does need to lay down to relieve them  PARTICIPATION LIMITATIONS: community activity  PERSONAL FACTORS: Fitness and Time since onset of injury/illness/exacerbation are also affecting patient's functional outcome.   REHAB POTENTIAL: Good  CLINICAL DECISION MAKING:  Stable/uncomplicated  EVALUATION COMPLEXITY: Low   GOALS: Goals reviewed with patient? Yes  SHORT TERM GOALS: Target date: 05/15/2022   Pt to be I with HEP.  Baseline:  Goal status: MET  2.  Pt to report no more than 1-2 headaches per week to demonstrate decreased frequency of pain and improve QOL.  Baseline: down to 3-4 from 5 Goal status: IN PROGRESS  3.  Pt to report no more than 5/10 pain with headaches due to improved muscle length and postural strength.  Baseline: 4-5/10 Goal status: MET   LONG TERM GOALS: Target date:  07/19/22 Updated 05/24/22  Pt to be I with advanced HEP.  Baseline:  Goal status: IN PROGRESS  2.  Pt to report no more than 1-2 headaches per month to demonstrate decreased frequency of pain and improve QOL.  Baseline:  Goal status: IN PROGRESS  3.  Pt to report no more than 3/10 pain with headaches due to improved muscle length and postural strength.  Baseline:  Goal status: IN PROGRESS  4.  Pt to demonstrate normal cervical ROM without pain due to improved tissue mobility and length for decreased headaches..  Baseline:  Goal status: IN PROGRESS  5.  Pt to demonstrate ability to maintain upright posture for at least 30 mins without pain due to improved strength on postural muscles.  Baseline:  Goal status: In progress   PLAN:  PT FREQUENCY: 1-2x/week  PT DURATION: 8 weeks  PLANNED INTERVENTIONS: Therapeutic exercises, Therapeutic activity, Neuromuscular re-education, Patient/Family education, Self Care, Joint mobilization, Joint manipulation, Dry Needling, Electrical stimulation, Spinal mobilization, Cryotherapy, Moist heat, scar mobilization, Taping, Traction, Ultrasound, Ionotophoresis 63m/ml Dexamethasone, and Manual therapy  PLAN FOR NEXT SESSION: continue manual and dry needling cervical as needed, scapular and core strength, f/u on standing at wall with arm lift and tennis ball release in supine  [employer] PT 05/24/22 9:43  AM

## 2022-05-24 ENCOUNTER — Ambulatory Visit: Payer: Medicare Other | Admitting: Physical Therapy

## 2022-05-24 DIAGNOSIS — R279 Unspecified lack of coordination: Secondary | ICD-10-CM

## 2022-05-24 DIAGNOSIS — M542 Cervicalgia: Secondary | ICD-10-CM

## 2022-05-24 DIAGNOSIS — M62838 Other muscle spasm: Secondary | ICD-10-CM

## 2022-05-24 DIAGNOSIS — M6281 Muscle weakness (generalized): Secondary | ICD-10-CM

## 2022-06-05 ENCOUNTER — Encounter: Payer: Self-pay | Admitting: Physical Therapy

## 2022-06-05 ENCOUNTER — Ambulatory Visit: Payer: Medicare Other | Attending: Neurology | Admitting: Physical Therapy

## 2022-06-05 DIAGNOSIS — R279 Unspecified lack of coordination: Secondary | ICD-10-CM | POA: Diagnosis present

## 2022-06-05 DIAGNOSIS — M542 Cervicalgia: Secondary | ICD-10-CM | POA: Insufficient documentation

## 2022-06-05 DIAGNOSIS — M6281 Muscle weakness (generalized): Secondary | ICD-10-CM | POA: Diagnosis present

## 2022-06-05 DIAGNOSIS — M62838 Other muscle spasm: Secondary | ICD-10-CM | POA: Insufficient documentation

## 2022-06-05 NOTE — Therapy (Signed)
OUTPATIENT PHYSICAL THERAPY CERVICAL TREATMENT   Patient Name: [name] MRN: [id number] DOB:January 11, 1951, 71 y.o., male Today's Date: 06/05/2022   PT End of Session - 06/05/22 0850     Visit Number 6    Date for PT Re-Evaluation 07/19/22    Authorization Type [employer] MCR    PT Start Time 0848    PT Stop Time 0928    PT Time Calculation (min) 40 min    Activity Tolerance Patient tolerated treatment well    Behavior During Therapy [facility] for tasks assessed/performed               Past Medical History:  Diagnosis Date   Arthritis    knee, no meds   Cancer ([place]) 2020   carcinoid tumor of right lung   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   Eczema 08/08/2010   Family history of adverse reaction to anesthesia    mom- post op N/V   Hyperlipidemia    PONV (postoperative nausea and vomiting)    Sleep apnea    uses CPAP nightly   Unilateral primary osteoarthritis, right knee 07/30/2019   Past Surgical History:  Procedure Laterality Date   COLONOSCOPY     hx polyps   EYE SURGERY     lasik   INTERCOSTAL NERVE BLOCK  01/20/2019   Procedure: Intercostal Nerve Block;  Surgeon: Grace Isaac, MD;  Location: [place];  Service: Thoracic;;   KNEE ARTHROSCOPY Right    SEPTOPLASTY  1980   x 2   TONSILLECTOMY AND ADENOIDECTOMY  1960   TOTAL KNEE ARTHROPLASTY Right 07/30/2019   TOTAL KNEE ARTHROPLASTY Right 07/30/2019   Procedure: RIGHT TOTAL KNEE ARTHROPLASTY;  Surgeon: Mcarthur Rossetti, MD;  Location: [place];  Service: Orthopedics;  Laterality: Right;   VIDEO ASSISTED THORACOSCOPY (VATS)/WEDGE RESECTION Right 01/20/2019   Procedure: VIDEO ASSISTED THORACOSCOPY (VATS) WITH RESECTION OF RIGHT INTRAPULMONARY LYMPH NODE;  Surgeon: Grace Isaac, MD;  Location: [place];  Service: Thoracic;  Laterality: Right;   VIDEO BRONCHOSCOPY N/A 01/20/2019   Procedure: VIDEO BRONCHOSCOPY;  Surgeon: Grace Isaac, MD;  Location: [place];  Service: Thoracic;  Laterality: N/A;   Patient  Active Problem List   Diagnosis Date Noted   Neck pain 03/21/2022   Obstructive sleep apnea 01/11/2022   Positional headache 07/13/2021   Unilateral primary osteoarthritis, right knee 07/30/2019   Status post total right knee replacement 07/30/2019   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  PERTINENT HISTORY:  Nothing of note  PAIN:  Are you having pain? No  PRECAUTIONS: None  WEIGHT BEARING RESTRICTIONS: No  FALLS:  Has patient fallen in last 6 months? No  LIVING ENVIRONMENT: Lives with: lives with their family Lives in: House/apartment  OCCUPATION: retired  PLOF: Independent  PATIENT GOALS: to have less pain  OBJECTIVE:   DIAGNOSTIC FINDINGS:  MRI of the brain with and without contrast was normal August 2022 03/27/22 MRI cervical spine without contrast demonstrating - At C5-6 leftward disc protrusion with severe left foraminal stenosis. - At C6-7 disc bulging, uncovertebral joint and facet hypertrophy with severe right foraminal stenosis. - At C4-5 disc bulging and facet hypertrophy with moderate left foraminal stenosis.  PATIENT SURVEYS:  FOTO next session   COGNITION: Overall cognitive status: Within functional limits for tasks assessed  SENSATION: WFL  POSTURE: rounded shoulders and forward  head  PALPATION: TTP at bil upper traps, scalenes mildly TTP, and bil suboccipitals TTP.  Tension noted throughout these areas as well  CERVICAL ROM:   Active ROM A/PROM (deg) eval  Flexion 30  Extension 55  Right lateral flexion 35  Left lateral flexion 40  Right rotation 40  Left rotation 50   (Blank rows = not tested)  UPPER EXTREMITY ROM:  WFL  UPPER EXTREMITY MMT:  WFL  CERVICAL SPECIAL TESTS:  Spurling's test: Negative and Distraction test: Negative    TODAY'S  TREATMENT:      Date: 06/05/2022 Manual: cervical and upper trap STM, lumbar spine  Trigger Point Dry-Needling  Treatment instructions: Expect mild to moderate muscle soreness. S/S of pneumothorax if dry needled over a lung field, and to seek immediate medical attention should they occur. Patient verbalized understanding of these instructions and education.  Patient Consent Given: Yes Education handout provided: No verbally reviewed information and pt has had dry needling in the past Muscles treated: bil cervical multifidi, suboccipitals, upper traps; Rt lumbar multifidi Electrical stimulation performed: No  Parameters: low frequency, low/mod intensity.  Treatment response/outcome: increased soft tissue length and pliability  Exercises: UBE x 4 min back/fwd 2 each Prone on green ball W, T, I - kneel on mat table - 10x each Single knee to chest 3 x 10 sec Wall push up with chin tuck - 20x Horizontal abduction with green theraband at wall - foam roll behind back 20x Shoulder flexion with 3 lb - post pelvic tilt maintained - 20x Bilat ER with foam roll against wall - green theraband 20x   TREATMENT:      Date: 05/24/2022 Manual: cervical and upper trap STM, thoracic spine  Trigger Point Dry-Needling  Treatment instructions: Expect mild to moderate muscle soreness. S/S of pneumothorax if dry needled over a lung field, and to seek immediate medical attention should they occur. Patient verbalized  understanding of these instructions and education.  Patient Consent Given: Yes Education handout provided: No verbally reviewed information and pt has had dry needling in the past Muscles treated: cervical multifidi, suboccipitals, right upper trap Electrical stimulation performed: Yes to R UT only  Parameters: low frequency, low/mod intensity.  Treatment response/outcome: increased soft tissue length and pliability  Exercises: Standing W scap retraction leaning on ball - 20x Single knee to chest 3 x 10 sec Horizontal abduction with green theraband at wall - 20x Shoulder flexion with post pelvic tilt maintained - 20x Bilat ER with blue theraband 20x   TREATMENT:  Date: 05/11/2022 Manual: cervical and upper trap STM, high velocity, low amplitude to thoracic spine.  Trigger Point Dry-Needling  Treatment instructions: Expect mild to moderate muscle soreness. S/S of pneumothorax if dry needled over a lung field, and to seek immediate medical attention should they occur. Patient verbalized understanding of these instructions and education.  Patient Consent Given: Yes Education handout provided: No verbally reviewed information and pt has had dry needling in the past Muscles treated: cervical multifidi, suboccipitals, right upper trap Electrical stimulation performed: Yes to R UT only  Parameters: low frequency, low/mod intensity.  Treatment response/outcome: increased soft tissue length and pliability  Exercises: Prone scap retraction Open books Rows with green theraband  Shoulder extension with green therabnd Bilat ER with green theraband.   Moist heat at end of session for 5 min.    Date: 05/03/2022 Manual: cervical and upper trap STM, high velocity, low amplitude to thoracic spine.  Trigger Point Dry-Needling  Treatment instructions: Expect mild to moderate muscle soreness. S/S of pneumothorax if dry needled over a lung field, and to seek immediate medical attention  should they occur. Patient verbalized understanding of these instructions and education.  Patient Consent Given: Yes Education handout provided: No verbally reviewed information and pt has had dry needling in the past Muscles treated: cervical multifidi, suboccipitals, right upper trap Electrical stimulation performed: Yes to R UT only  Parameters: low frequency, low/mod intensity.  Treatment response/outcome: increased soft tissue length and pliability  Exercises: Chin tucks  Chin tucks with rotation at end range.  Open books Seated rotation Seated thoracic extension over chair   PATIENT EDUCATION:  Education details: [id number] Person educated: Patient Education method: Explanation, Demonstration, Tactile cues, Verbal cues, and Handouts Education comprehension: verbalized understanding and returned demonstration  HOME EXERCISE PROGRAM: Access Code: [email or web address] URL: [email or web address] Date: 05/24/2022 Prepared by: Jari Favre  Exercises - Seated Passive Cervical Retraction  - 1 x daily - 7 x weekly - 1 sets - 10 reps - Seated Cervical Flexion AROM  - 1 x daily - 7 x weekly - 1 sets - 3 reps - 30s holds - Seated Cervical Extension AROM  - 1 x daily - 7 x weekly - 1 sets - 3 reps - 30s holds - Seated Levator Scapulae Stretch  - 1 x daily - 7 x weekly - 1 sets - 3 reps - 30s holds - Seated Cervical Sidebending Stretch  - 1 x daily - 7 x weekly - 1 sets - 3 reps - 30s holds - Doorway Pec Stretch at 90 Degrees Abduction  - 1 x daily - 7 x weekly - 1 sets - 3 reps - 30s holds - Sidelying Thoracic Rotation with Open Book  - 1 x daily - 7 x weekly - 1 sets - 5 reps - 10 sec hold - Posterior Pelvic Tilt at [place]  - 1 x daily - 7 x weekly - 3 sets - 10 reps - Supine Single Knee to Chest Stretch  - 2 x daily - 7 x weekly - 1 sets - 5 reps - 10 sec hold  ASSESSMENT:  CLINICAL IMPRESSION: Pt is responding well to  PT and has had less HA this week.  Pt  is able to progress posture strength and added weights and more challenging exercises to treatment today.  Pt has a lot of tensionin lumbar on the right side and is suspected that this is translating to upper back and adding to asymmetry of posture so this was  addressed today as well  Pt is expected to continue to see improvements with PT and recommended to continue for pain management and strength.   OBJECTIVE IMPAIRMENTS: decreased coordination, decreased endurance, decreased mobility, decreased strength, increased fascial restrictions, increased muscle spasms, impaired flexibility, improper body mechanics, postural dysfunction, and pain.   ACTIVITY LIMITATIONS:  pt reports he is able to complete tasks but when headaches are worse he does need to lay down to relieve them  PARTICIPATION LIMITATIONS: community activity  PERSONAL FACTORS: Fitness and Time since onset of injury/illness/exacerbation are also affecting patient's functional outcome.   REHAB POTENTIAL: Good  CLINICAL DECISION MAKING: Stable/uncomplicated  EVALUATION COMPLEXITY: Low   GOALS: Goals reviewed with patient? Yes  SHORT TERM GOALS: Target date: 05/15/2022   Pt to be I with HEP.  Baseline:  Goal status: MET  2.  Pt to report no more than 1-2 headaches per week to demonstrate decreased frequency of pain and improve QOL.  Baseline: down to 3-4 from 5 Goal status: IN PROGRESS  3.  Pt to report no more than 5/10 pain with headaches due to improved muscle length and postural strength.  Baseline: 4-5/10 Goal status: MET   LONG TERM GOALS: Target date:  07/19/22 Updated 05/24/22  Pt to be I with advanced HEP.  Baseline:  Goal status: IN PROGRESS  2.  Pt to report no more than 1-2 headaches per month to demonstrate decreased frequency of pain and improve QOL.  Baseline:  Goal status: IN PROGRESS  3.  Pt to report no more than 3/10 pain with headaches due to improved muscle length and postural strength.   Baseline:  Goal status: IN PROGRESS  4.  Pt to demonstrate normal cervical ROM without pain due to improved tissue mobility and length for decreased headaches..  Baseline:  Goal status: IN PROGRESS  5.  Pt to demonstrate ability to maintain upright posture for at least 30 mins without pain due to improved strength on postural muscles.  Baseline:  Goal status: In progress   PLAN:  PT FREQUENCY: 1-2x/week  PT DURATION: 8 weeks  PLANNED INTERVENTIONS: Therapeutic exercises, Therapeutic activity, Neuromuscular re-education, Patient/Family education, Self Care, Joint mobilization, Joint manipulation, Dry Needling, Electrical stimulation, Spinal mobilization, Cryotherapy, Moist heat, scar mobilization, Taping, Traction, Ultrasound, Ionotophoresis 92m/ml Dexamethasone, and Manual therapy  PLAN FOR NEXT SESSION: continue manual and dry needling cervical as needed, scapular and core strength, f/u on standing at wall with weights, f/u on dry needling   JGustavus Bryant PT 06/05/22 10:15 AM

## 2022-06-12 ENCOUNTER — Ambulatory Visit: Payer: Medicare Other | Admitting: Physical Therapy

## 2022-06-12 ENCOUNTER — Encounter: Payer: Self-pay | Admitting: Physical Therapy

## 2022-06-12 DIAGNOSIS — R279 Unspecified lack of coordination: Secondary | ICD-10-CM

## 2022-06-12 DIAGNOSIS — M62838 Other muscle spasm: Secondary | ICD-10-CM | POA: Diagnosis not present

## 2022-06-12 DIAGNOSIS — M542 Cervicalgia: Secondary | ICD-10-CM

## 2022-06-12 DIAGNOSIS — M6281 Muscle weakness (generalized): Secondary | ICD-10-CM

## 2022-06-12 NOTE — Therapy (Signed)
OUTPATIENT PHYSICAL THERAPY CERVICAL TREATMENT   Patient Name: [name] MRN: [id number] DOB:11-25-1950, 71 y.o., male Today's Date: 06/12/2022   PT End of Session - 06/12/22 1015     Visit Number 7    Date for PT Re-Evaluation 07/19/22    Authorization Type [employer] MCR    PT Start Time 9767    PT Stop Time 1056    PT Time Calculation (min) 42 min    Activity Tolerance Patient tolerated treatment well    Behavior During Therapy WFL for tasks assessed/performed                Past Medical History:  Diagnosis Date   Arthritis    knee, no meds   Cancer ([place]) 2020   carcinoid tumor of right lung   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;  Service: Thoracic;;   KNEE ARTHROSCOPY Right    SEPTOPLASTY  1980   x 2   TONSILLECTOMY AND ADENOIDECTOMY  1960   TOTAL KNEE ARTHROPLASTY Right 07/30/2019   TOTAL KNEE ARTHROPLASTY Right 07/30/2019   Procedure: RIGHT TOTAL KNEE ARTHROPLASTY;  Surgeon: Mcarthur Rossetti, MD;  Location: [place];  Service: Orthopedics;  Laterality: Right;   VIDEO ASSISTED THORACOSCOPY (VATS)/WEDGE RESECTION Right 01/20/2019   Procedure: VIDEO ASSISTED THORACOSCOPY (VATS) WITH RESECTION OF RIGHT INTRAPULMONARY LYMPH NODE;  Surgeon: Grace Isaac, MD;  Location: [place];  Service: Thoracic;  Laterality: Right;   VIDEO BRONCHOSCOPY N/A 01/20/2019   Procedure: VIDEO BRONCHOSCOPY;  Surgeon: Grace Isaac, MD;  Location: [place];  Service: Thoracic;  Laterality: N/A;    Patient Active Problem List   Diagnosis Date Noted   Neck pain 03/21/2022   Obstructive sleep apnea 01/11/2022   Positional headache 07/13/2021   Unilateral primary osteoarthritis, right knee 07/30/2019   Status post total right knee replacement 07/30/2019   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  I would still like to try some more PT because it is helping but not totally gone.  Heat still helps also.  PERTINENT HISTORY:  Nothing of note  PAIN:  Are you having pain? No  PRECAUTIONS: None  WEIGHT BEARING RESTRICTIONS: No  FALLS:  Has patient fallen in last 6 months? No  LIVING ENVIRONMENT: Lives with: lives with their family Lives in: House/apartment  OCCUPATION: retired  PLOF: Independent  PATIENT GOALS: to have less pain  OBJECTIVE:   DIAGNOSTIC FINDINGS:  MRI of the brain with and without contrast was normal August 2022 03/27/22 MRI cervical spine without contrast demonstrating - At C5-6 leftward disc protrusion with severe left foraminal stenosis. - At C6-7 disc bulging, uncovertebral joint and facet hypertrophy with severe right foraminal stenosis. - At C4-5 disc bulging and facet hypertrophy with moderate left foraminal stenosis.  PATIENT SURVEYS:  FOTO next session    COGNITION: Overall cognitive status: Within functional limits for tasks assessed  SENSATION: WFL  POSTURE: rounded shoulders and forward head  PALPATION: TTP at bil upper traps, scalenes mildly TTP, and bil suboccipitals TTP.  Tension noted throughout these areas as well  CERVICAL ROM:   Active ROM A/PROM (deg) eval A/PROM (deg) 06/12/22   Flexion 30 55  Extension 55 26  Right lateral flexion 35 35  Left lateral flexion 40 28  Right rotation 40 55  Left rotation 50 55   (Blank rows = not tested)  UPPER EXTREMITY ROM:  WFL  UPPER EXTREMITY MMT:  WFL  CERVICAL SPECIAL TESTS:  Spurling's test: Negative and Distraction test: Negative    TODAY'S  TREATMENT:      Date: 06/12/2022 Manual: cervical and upper trap STM Trigger Point Dry-Needling  Treatment instructions: Expect mild to moderate muscle soreness. S/S of pneumothorax if dry needled over a lung field, and to seek immediate medical attention should they occur. Patient verbalized understanding of these instructions and education.  Patient Consent Given: Yes Education handout provided: No verbally reviewed information and pt has had dry needling in the past Muscles treated: bil cervical multifidi, suboccipitals, upper traps Electrical stimulation performed: No  Parameters: low frequency, low/mod intensity.  Treatment response/outcome: increased soft tissue length and pliability  Exercises: Horizontal abduction with blue theraband supine - 20x ROM as seen above  Date: 06/05/2022 Manual: cervical and upper trap STM, lumbar spine  Trigger Point Dry-Needling  Treatment instructions: Expect mild to moderate muscle soreness. S/S of pneumothorax if dry needled over a lung field, and to seek immediate medical attention should they occur. Patient verbalized understanding of these instructions and education.  Patient Consent Given: Yes Education handout provided: No verbally reviewed information and pt has had  dry needling in the past Muscles treated: bil cervical multifidi, suboccipitals, upper traps; Rt lumbar multifidi Electrical stimulation performed: No  Parameters: low frequency, low/mod intensity.  Treatment response/outcome: increased soft tissue length and pliability  Exercises: UBE x 4 min back/fwd 2 each Prone on green ball W, T, I - kneel on mat table - 10x each Single knee to chest 3 x 10 sec Wall push up with chin tuck - 20x Horizontal abduction with green theraband at wall - foam roll behind back 20x Shoulder flexion with 3 lb - post pelvic tilt maintained - 20x Bilat ER with foam roll against wall - green theraband 20x   TREATMENT:      Date: 05/24/2022 Manual: cervical and   upper trap STM, thoracic spine  Trigger Point Dry-Needling  Treatment instructions: Expect mild to moderate muscle soreness. S/S of pneumothorax if dry needled over a lung field, and to seek immediate medical attention should they occur. Patient verbalized understanding of these instructions and education.  Patient Consent Given: Yes Education handout provided: No verbally reviewed information and pt has had dry needling in the past Muscles treated: cervical multifidi, suboccipitals, right upper trap Electrical stimulation performed: Yes to R UT only  Parameters: low frequency, low/mod intensity.  Treatment response/outcome: increased soft tissue length and pliability  Exercises: Standing W scap retraction leaning on ball - 20x Single knee to chest 3 x 10 sec Horizontal abduction with green theraband at wall - 20x Shoulder flexion with post pelvic tilt maintained - 20x Bilat ER with blue theraband 20x   TREATMENT:      Date: 05/11/2022 Manual: cervical and upper trap STM, high velocity, low amplitude to thoracic spine.  Trigger Point Dry-Needling  Treatment instructions: Expect mild to moderate muscle soreness. S/S of pneumothorax if dry needled over a lung field, and to seek immediate medical  attention should they occur. Patient verbalized understanding of these instructions and education.  Patient Consent Given: Yes Education handout provided: No verbally reviewed information and pt has had dry needling in the past Muscles treated: cervical multifidi, suboccipitals, right upper trap Electrical stimulation performed: Yes to R UT only  Parameters: low frequency, low/mod intensity.  Treatment response/outcome: increased soft tissue length and pliability  Exercises: Prone scap retraction Open books Rows with green theraband  Shoulder extension with green therabnd Bilat ER with green theraband.   Moist heat at end of session for 5 min.     PATIENT EDUCATION:  Education details: [id number] Person educated: Patient Education method: Explanation, Demonstration, Tactile cues, Verbal cues, and Handouts Education comprehension: verbalized understanding and returned demonstration  HOME EXERCISE PROGRAM: Access Code: [email or web address] URL: [email or web address] Date: 05/24/2022 Prepared by: Jari Favre  Exercises - Seated Passive Cervical Retraction  - 1 x daily - 7 x weekly - 1 sets - 10 reps - Seated Cervical Flexion AROM  - 1 x daily - 7 x weekly - 1 sets - 3 reps - 30s holds - Seated Cervical Extension AROM  - 1 x daily - 7 x weekly - 1 sets - 3 reps - 30s holds - Seated Levator Scapulae Stretch  - 1 x daily - 7 x weekly - 1 sets - 3 reps - 30s holds - Seated Cervical Sidebending Stretch  - 1 x daily - 7 x weekly - 1 sets - 3 reps - 30s holds - Doorway Pec Stretch at 90 Degrees Abduction  - 1 x daily - 7 x weekly - 1 sets - 3 reps - 30s holds - Sidelying Thoracic Rotation with Open Book  - 1 x daily - 7 x weekly - 1 sets - 5 reps - 10 sec hold - Posterior Pelvic Tilt at [place]  - 1 x daily - 7 x weekly - 3 sets - 10 reps - Supine Single Knee to Chest Stretch  - 2 x daily - 7 x weekly - 1 sets - 5 reps - 10 sec hold  ASSESSMENT:  CLINICAL  IMPRESSION: Pt has met a couple of long term goals. He is expected to be able to continue to progress as he continues to make steady porgress with STM and posture strength.  Today's session focused on soft tissue release and ROM . Pt is recommended  to continue skilled PT for at least 2 more visits in order to get maximum benefit from dry needling and soft tissue work while building postural strength.    OBJECTIVE IMPAIRMENTS: decreased coordination, decreased endurance, decreased mobility, decreased strength, increased fascial restrictions, increased muscle spasms, impaired flexibility, improper body mechanics, postural dysfunction, and pain.   ACTIVITY LIMITATIONS:  pt reports he is able to complete tasks but when headaches are worse he does need to lay down to relieve them  PARTICIPATION LIMITATIONS: community activity  PERSONAL FACTORS: Fitness and Time since onset of injury/illness/exacerbation are also affecting patient's functional outcome.   REHAB POTENTIAL: Good  CLINICAL DECISION MAKING: Stable/uncomplicated  EVALUATION COMPLEXITY: Low   GOALS: Goals reviewed with patient? Yes  SHORT TERM GOALS: Target date: 05/15/2022   Pt to be I with HEP.  Baseline:  Goal status: MET  2.  Pt to report no more than 1-2 headaches per week to demonstrate decreased frequency of pain and improve QOL.  Baseline: down to 3-4 from 5 Goal status: IN PROGRESS  3.  Pt to report no more than 5/10 pain with headaches due to improved muscle length and postural strength.  Baseline: 4-5/10 Goal status: MET   LONG TERM GOALS: Target date:  07/19/22 Updated 06/12/22  Pt to be I with advanced HEP.  Baseline:  Goal status: IN PROGRESS  2.  Pt to report no more than 1-2 headaches per month to demonstrate decreased frequency of pain and improve QOL.  Baseline: 2-3 per week (down from daily) Goal status: IN PROGRESS  3.  Pt to report no more than 3/10 pain with headaches due to improved muscle  length and postural strength.  Baseline: 2-3/10  Goal status: MET   4.  Pt to demonstrate normal cervical ROM without pain due to improved tissue mobility and length for decreased headaches..  Baseline:  Goal status: IN PROGRESS  5.  Pt to demonstrate ability to maintain upright posture for at least 30 mins without pain due to improved strength on postural muscles.  Baseline: I can sit for 30-45 minutres Goal status: Met   PLAN:  PT FREQUENCY: 1-2x/week  PT DURATION: 8 weeks  PLANNED INTERVENTIONS: Therapeutic exercises, Therapeutic activity, Neuromuscular re-education, Patient/Family education, Self Care, Joint mobilization, Joint manipulation, Dry Needling, Electrical stimulation, Spinal mobilization, Cryotherapy, Moist heat, scar mobilization, Taping, Traction, Ultrasound, Ionotophoresis 47m/ml Dexamethasone, and Manual therapy  PLAN FOR NEXT SESSION: continue manual and dry needling cervical as needed, scapular and core strength, f/u on standing at wall with weights  JGustavus Bryant PT 06/12/22 10:59 AM

## 2022-06-27 ENCOUNTER — Ambulatory Visit: Payer: Medicare Other | Attending: Neurology | Admitting: Physical Therapy

## 2022-06-27 DIAGNOSIS — R279 Unspecified lack of coordination: Secondary | ICD-10-CM | POA: Diagnosis present

## 2022-06-27 DIAGNOSIS — M62838 Other muscle spasm: Secondary | ICD-10-CM | POA: Diagnosis not present

## 2022-06-27 DIAGNOSIS — M6281 Muscle weakness (generalized): Secondary | ICD-10-CM | POA: Diagnosis present

## 2022-06-27 DIAGNOSIS — M542 Cervicalgia: Secondary | ICD-10-CM | POA: Diagnosis present

## 2022-06-27 NOTE — Therapy (Signed)
OUTPATIENT PHYSICAL THERAPY CERVICAL TREATMENT   Patient Name: [name] MRN: [id number] DOB:Sep 04, 1950, 72 y.o., male Today's Date: 06/27/2022   PT End of Session - 06/27/22 1111     Visit Number 8    Date for PT Re-Evaluation 07/19/22    Authorization Type [employer] MCR    PT Start Time 1165    PT Stop Time 1100    PT Time Calculation (min) 42 min    Activity Tolerance Patient tolerated treatment well    Behavior During Therapy WFL for tasks assessed/performed                 Past Medical History:  Diagnosis Date   Arthritis    knee, no meds   Cancer ([place]) 2020   carcinoid tumor of right lung   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   Eczema 08/08/2010   Family history of adverse reaction to anesthesia    mom- post op N/V   Hyperlipidemia    PONV (postoperative nausea and vomiting)    Sleep apnea    uses CPAP nightly   Unilateral primary osteoarthritis, right knee 07/30/2019   Past Surgical History:  Procedure Laterality Date   COLONOSCOPY     hx polyps   EYE SURGERY     lasik   INTERCOSTAL NERVE BLOCK  01/20/2019   Procedure: Intercostal Nerve Block;  Surgeon: Grace Isaac, MD;  Location: [facility] [facility];  Service: Thoracic;;   KNEE ARTHROSCOPY Right    SEPTOPLASTY  1980   x 2   TONSILLECTOMY AND ADENOIDECTOMY  1960   TOTAL KNEE ARTHROPLASTY Right 07/30/2019   TOTAL KNEE ARTHROPLASTY Right 07/30/2019   Procedure: RIGHT TOTAL KNEE ARTHROPLASTY;  Surgeon: Mcarthur Rossetti, MD;  Location: [facility];  Service: Orthopedics;  Laterality: Right;   VIDEO ASSISTED THORACOSCOPY (VATS)/WEDGE RESECTION Right 01/20/2019   Procedure: VIDEO ASSISTED THORACOSCOPY (VATS) WITH RESECTION OF RIGHT INTRAPULMONARY LYMPH NODE;  Surgeon: Grace Isaac, MD;  Location: [place];  Service: Thoracic;  Laterality: Right;   VIDEO BRONCHOSCOPY N/A 01/20/2019   Procedure: VIDEO BRONCHOSCOPY;  Surgeon: Grace Isaac, MD;  Location: [facility];  Service: Thoracic;  Laterality: N/A;    Patient Active Problem List   Diagnosis Date Noted   Neck pain 03/21/2022   Obstructive sleep apnea 01/11/2022   Positional headache 07/13/2021   Unilateral primary osteoarthritis, right knee 07/30/2019   Status post total right knee replacement 07/30/2019   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 He reports tremendous improvements.   PERTINENT HISTORY:  Nothing of note  PAIN:  Are you having pain? No  PRECAUTIONS: None  WEIGHT BEARING RESTRICTIONS: No  FALLS:  Has patient fallen in last 6 months? No  LIVING ENVIRONMENT: Lives with: lives with their family Lives in: House/apartment  OCCUPATION: retired  PLOF: Independent  PATIENT GOALS: to have less pain  OBJECTIVE:   DIAGNOSTIC FINDINGS:  MRI of the brain with and without contrast was normal August 2022 03/27/22 MRI cervical spine without contrast demonstrating - At C5-6 leftward disc protrusion with severe left foraminal stenosis. - At C6-7 disc bulging, uncovertebral joint and facet hypertrophy with severe right foraminal stenosis. - At C4-5 disc bulging and facet hypertrophy with moderate left foraminal stenosis.  PATIENT SURVEYS:  FOTO next session   COGNITION: Overall cognitive status: Within functional limits for tasks  assessed  SENSATION: WFL  POSTURE: rounded shoulders and forward head  PALPATION: TTP at bil upper traps, scalenes mildly TTP, and bil suboccipitals TTP.  Tension noted throughout these areas as well  CERVICAL ROM:   Active ROM A/PROM (deg) eval A/PROM (deg) 06/12/22   Flexion 30 55  Extension 55 26  Right lateral flexion 35 35  Left lateral flexion 40 28  Right rotation 40 55  Left rotation 50 55   (Blank rows = not tested)  UPPER EXTREMITY ROM:  WFL  UPPER EXTREMITY MMT:  [facility]  CERVICAL SPECIAL TESTS:  Spurling's test: Negative and Distraction test: Negative    TODAY'S  TREATMENT:      Date: 06/27/2022 Exercises:  UBE 3 min fwd/ bkwd Chin tucks 5 sec hold, x15  Manual: cervical paraspinals STM Cervical distraction  SNAGS bilat  Horizontal abduction with blue theraband supine - 20x  Trigger Point Dry-Needling  Treatment instructions: Expect mild to moderate muscle soreness. S/S of pneumothorax if dry needled over a lung field, and to seek immediate medical attention should they occur. Patient verbalized understanding of these instructions and education.  Patient Consent Given: Yes Education handout provided: No verbally reviewed information and pt has had dry needling in the past Muscles treated: bil cervical multifidi, suboccipitals, upper traps Electrical stimulation performed: No  Parameters: low frequency, low/mod intensity.  Treatment response/outcome: increased soft tissue length and pliability    Date: 06/12/2022 Manual: cervical and upper trap STM Trigger Point Dry-Needling  Treatment instructions: Expect mild to moderate muscle soreness. S/S of pneumothorax if dry needled over a lung field, and to seek immediate medical attention should they occur. Patient verbalized understanding of these instructions and education.  Patient Consent Given: Yes Education handout provided: No verbally reviewed information and pt has had dry needling in the  past Muscles treated: bil cervical multifidi, suboccipitals, upper traps Electrical stimulation performed: No  Parameters: low frequency, low/mod intensity.  Treatment response/outcome: increased soft tissue length and pliability  Exercises: Horizontal abduction with blue theraband supine - 20x ROM as seen [id number]  Date: 06/05/2022 Manual: cervical and upper trap STM, lumbar spine  Trigger Point Dry-Needling  Treatment instructions: Expect mild to moderate muscle soreness. S/S of pneumothorax if dry needled over a lung field, and to seek immediate medical attention should they occur. Patient verbalized understanding of these instructions and education.  Patient Consent Given: Yes Education handout provided: No verbally reviewed information and pt has had dry needling in the past Muscles treated: bil cervical multifidi, suboccipitals, upper traps; Rt lumbar multifidi Electrical stimulation  performed: No  Parameters: low frequency, low/mod intensity.  Treatment response/outcome: increased soft tissue length and pliability  Exercises: UBE x 4 min back/fwd 2 each Prone on green ball W, T, I - kneel on mat table - 10x each Single knee to chest 3 x 10 sec Wall push up with chin tuck - 20x Horizontal abduction with green theraband at wall - foam roll behind back 20x Shoulder flexion with 3 lb - post pelvic tilt maintained - 20x Bilat ER with foam roll against wall - green theraband 20x   TREATMENT:      Date: 05/24/2022 Manual: cervical and upper trap STM, thoracic spine  Trigger Point Dry-Needling  Treatment instructions: Expect mild to moderate muscle soreness. S/S of pneumothorax if dry needled over a lung field, and to seek immediate medical attention should they occur. Patient verbalized understanding of these instructions and education.  Patient Consent Given: Yes Education handout provided: No verbally reviewed information and pt has had dry needling in the past Muscles  treated: cervical multifidi, suboccipitals, right upper trap Electrical stimulation performed: Yes to R UT only  Parameters: low frequency, low/mod intensity.  Treatment response/outcome: increased soft tissue length and pliability  Exercises: Standing W scap retraction leaning on ball - 20x Single knee to chest 3 x 10 sec Horizontal abduction with green theraband at wall - 20x Shoulder flexion with post pelvic tilt maintained - 20x Bilat ER with blue theraband 20x   TREATMENT:      Date: 05/11/2022 Manual: cervical and upper trap STM, high velocity, low amplitude to thoracic spine.  Trigger Point Dry-Needling  Treatment instructions: Expect mild to moderate muscle soreness. S/S of pneumothorax if dry needled over a lung field, and to seek immediate medical attention should they occur. Patient verbalized understanding of these instructions and education.  Patient Consent Given: Yes Education handout provided: No verbally reviewed information and pt has had dry needling in the past Muscles treated: cervical multifidi, suboccipitals, right upper trap Electrical stimulation performed: Yes to R [place] only  Parameters: low frequency, low/mod intensity.  Treatment response/outcome: increased soft tissue length and pliability  Exercises: Prone scap retraction Open books Rows with green theraband  Shoulder extension with green therabnd Bilat ER with green theraband.   Moist heat at end of session for 5 min.     PATIENT EDUCATION:  Education details: [email or web address] Person educated: Patient Education method: Explanation, Demonstration, Tactile cues, Verbal cues, and Handouts Education comprehension: verbalized understanding and returned demonstration  HOME EXERCISE PROGRAM: Access Code: [email or web address] URL: [email or web address] Date: 05/24/2022 Prepared by: Jari Favre  Exercises - Seated Passive Cervical Retraction  - 1 x daily - 7 x weekly - 1 sets - 10 reps -  Seated Cervical Flexion AROM  - 1 x daily - 7 x weekly - 1 sets - 3 reps - 30s holds - Seated Cervical Extension AROM  - 1 x daily - 7 x weekly - 1 sets - 3 reps - 30s holds - Seated Levator Scapulae Stretch  - 1 x daily - 7 x weekly - 1 sets - 3 reps - 30s holds - Seated Cervical Sidebending Stretch  - 1 x daily - 7 x weekly - 1 sets - 3 reps - 30s holds - Doorway Pec Stretch at 90 Degrees Abduction  - 1 x daily - 7 x weekly - 1 sets - 3 reps - 30s holds - Sidelying Thoracic Rotation with Open Book  - 1 x daily - 7 x weekly - 1 sets -  5 reps - 10 sec hold - Posterior Pelvic Tilt at [place]  - 1 x daily - 7 x weekly - 3 sets - 10 reps - Supine Single Knee to Chest Stretch  - 2 x daily - 7 x weekly - 1 sets - 5 reps - 10 sec hold  ASSESSMENT:  CLINICAL IMPRESSION: Pt reports with minimal pain and very infrequent HA since his last session. He would like to D/C to HEP after next session. He continues to benefit from skilled STM and DN due to continued tension in cervical paraspinals. Pt educated on SNAGS and chin tucks to help alleviate continued tension.  Pt plans to continue with massages after discontinuing PT. He was ensured he could return if needed.    OBJECTIVE IMPAIRMENTS: decreased coordination, decreased endurance, decreased mobility, decreased strength, increased fascial restrictions, increased muscle spasms, impaired flexibility, improper body mechanics, postural dysfunction, and pain.   ACTIVITY LIMITATIONS:  pt reports he is able to complete tasks but when headaches are worse he does need to lay down to relieve them  PARTICIPATION LIMITATIONS: community activity  PERSONAL FACTORS: Fitness and Time since onset of injury/illness/exacerbation are also affecting patient's functional outcome.   REHAB POTENTIAL: Good  CLINICAL DECISION MAKING: Stable/uncomplicated  EVALUATION COMPLEXITY: Low   GOALS: Goals reviewed with patient? Yes  SHORT TERM GOALS: Target date:  05/15/2022   Pt to be I with HEP.  Baseline:  Goal status: MET  2.  Pt to report no more than 1-2 headaches per week to demonstrate decreased frequency of pain and improve QOL.  Baseline: down to 3-4 from 5 Goal status: IN PROGRESS  3.  Pt to report no more than 5/10 pain with headaches due to improved muscle length and postural strength.  Baseline: 4-5/10 Goal status: MET   LONG TERM GOALS: Target date:  07/19/22 Updated 06/12/22  Pt to be I with advanced HEP.  Baseline:  Goal status: IN PROGRESS  2.  Pt to report no more than 1-2 headaches per month to demonstrate decreased frequency of pain and improve QOL.  Baseline: 2-3 per week (down from daily) Goal status: IN PROGRESS  3.  Pt to report no more than 3/10 pain with headaches due to improved muscle length and postural strength.  Baseline: 2-3/10  Goal status: MET   4.  Pt to demonstrate normal cervical ROM without pain due to improved tissue mobility and length for decreased headaches..  Baseline:  Goal status: IN PROGRESS  5.  Pt to demonstrate ability to maintain upright posture for at least 30 mins without pain due to improved strength on postural muscles.  Baseline: I can sit for 30-45 minutres Goal status: Met   PLAN:  PT FREQUENCY: 1-2x/week  PT DURATION: 8 weeks  PLANNED INTERVENTIONS: Therapeutic exercises, Therapeutic activity, Neuromuscular re-education, Patient/Family education, Self Care, Joint mobilization, Joint manipulation, Dry Needling, Electrical stimulation, Spinal mobilization, Cryotherapy, Moist heat, scar mobilization, Taping, Traction, Ultrasound, Ionotophoresis 77m/ml Dexamethasone, and Manual therapy  PLAN FOR NEXT SESSION: D/C to HEP.   SRudi HeapPT, DPT 06/27/22  11:13 AM

## 2022-07-05 ENCOUNTER — Ambulatory Visit: Payer: Medicare Other | Admitting: Physical Therapy

## 2022-07-05 DIAGNOSIS — R279 Unspecified lack of coordination: Secondary | ICD-10-CM

## 2022-07-05 DIAGNOSIS — M542 Cervicalgia: Secondary | ICD-10-CM

## 2022-07-05 DIAGNOSIS — M6281 Muscle weakness (generalized): Secondary | ICD-10-CM

## 2022-07-05 DIAGNOSIS — M62838 Other muscle spasm: Secondary | ICD-10-CM

## 2022-07-05 NOTE — Therapy (Signed)
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   Eczema 08/08/2010   Family history of adverse reaction to anesthesia    mom- post op N/V   Hyperlipidemia    PONV (postoperative nausea and vomiting)    Sleep apnea    uses CPAP nightly   Unilateral primary osteoarthritis, right knee 07/30/2019   Past Surgical History:  Procedure Laterality Date   COLONOSCOPY     hx polyps   EYE SURGERY     lasik   INTERCOSTAL NERVE BLOCK  01/20/2019   Procedure: Intercostal Nerve Block;  Surgeon: Grace Isaac, MD;  Location: [place];  Service: Thoracic;;   KNEE ARTHROSCOPY Right    SEPTOPLASTY  1980   x 2   TONSILLECTOMY AND ADENOIDECTOMY  1960   TOTAL KNEE ARTHROPLASTY Right 07/30/2019   TOTAL KNEE ARTHROPLASTY Right 07/30/2019   Procedure: RIGHT TOTAL KNEE ARTHROPLASTY;  Surgeon: Mcarthur Rossetti, MD;  Location: [place];  Service: Orthopedics;  Laterality: Right;   VIDEO ASSISTED THORACOSCOPY (VATS)/WEDGE RESECTION Right 01/20/2019   Procedure: VIDEO ASSISTED THORACOSCOPY (VATS) WITH RESECTION OF RIGHT INTRAPULMONARY LYMPH NODE;  Surgeon: Grace Isaac, MD;  Location: [place];  Service: Thoracic;  Laterality: Right;   VIDEO BRONCHOSCOPY N/A 01/20/2019   Procedure: VIDEO BRONCHOSCOPY;  Surgeon: Grace Isaac, MD;  Location: [place];  Service: Thoracic;  Laterality: N/A;   Patient Active Problem List   Diagnosis Date Noted   Neck pain 03/21/2022   Obstructive sleep apnea 01/11/2022    Positional headache 07/13/2021   Unilateral primary osteoarthritis, right knee 07/30/2019   Status post total right knee replacement 07/30/2019   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   PERTINENT HISTORY:  Nothing of note  PAIN:  Are you having pain? No  PRECAUTIONS: None  WEIGHT BEARING RESTRICTIONS: No  FALLS:  Has patient fallen in last 6 months? No  LIVING ENVIRONMENT: Lives with: lives with their family Lives in: House/apartment  OCCUPATION: retired  PLOF: Independent  PATIENT GOALS: to have less pain  OBJECTIVE:   DIAGNOSTIC FINDINGS:  MRI of the brain with and without contrast was normal August 2022 03/27/22 MRI cervical spine without contrast demonstrating - At C5-6 leftward disc protrusion with severe left foraminal stenosis. - At C6-7 disc bulging, uncovertebral joint and facet hypertrophy with severe right foraminal stenosis. - At C4-5 disc bulging and facet hypertrophy with moderate left foraminal stenosis.  PATIENT SURVEYS:  FOTO next session   COGNITION: Overall cognitive status: Within functional limits for tasks assessed  SENSATION: WFL  POSTURE: rounded shoulders and forward head  PALPATION: TTP at bil upper traps, scalenes mildly TTP, and bil suboccipitals TTP.   Tension noted throughout these areas as well  CERVICAL ROM:   Active ROM A/PROM (deg) eval A/PROM (deg) 06/12/22   Flexion 30 55  Extension 55 26  Right lateral flexion 35 35  Left lateral flexion 40 28  Right rotation 40 55  Left rotation 50 55   (Blank rows = not tested)  UPPER EXTREMITY ROM:  WFL  UPPER EXTREMITY MMT:  [facility]  CERVICAL SPECIAL TESTS:  Spurling's test: Negative and Distraction test: Negative    TODAY'S  TREATMENT:      Date: 07/05/2022 Exercises with NMRE cues for posture throughout:  UBE 3 min fwd/ bkwd Chin tucks 5 sec hold with thoracic ext in sitting Horizontal abduction with blue theraband sitting - 20x W with ball behind back 20x  Manual: cervical paraspinals STM Cervical distraction  SNAGS bilat    Trigger Point Dry-Needling  Treatment instructions: Expect mild to moderate muscle soreness. S/S of pneumothorax if dry needled over a lung field, and to seek immediate medical attention should they occur. Patient verbalized understanding of these instructions and education.  Patient Consent Given: Yes Education handout provided: No verbally reviewed information and pt has had dry needling in the past Muscles treated: bil cervical multifidi, suboccipitals, upper traps Electrical stimulation performed: No  Parameters: low frequency, low/mod intensity.  Treatment response/outcome: increased soft tissue length and pliability  Date: 06/27/2022 Exercises:  UBE 3 min fwd/ bkwd Chin tucks 5 sec hold, x15  Manual: cervical paraspinals STM Cervical distraction  SNAGS bilat  Horizontal abduction with blue theraband supine - 20x  Trigger Point Dry-Needling  Treatment instructions: Expect mild to moderate muscle soreness. S/S of pneumothorax if dry needled over a lung field, and to seek immediate medical attention should they occur. Patient verbalized understanding of these instructions and education.  Patient Consent Given: Yes Education  handout provided: No verbally reviewed information and pt has had dry needling in the past Muscles treated: bil cervical multifidi, suboccipitals, upper traps Electrical stimulation performed: No  Parameters: low frequency, low/mod intensity.  Treatment response/outcome: increased soft tissue length and pliability    Date: 06/12/2022 Manual: cervical and upper trap STM Trigger Point Dry-Needling  Treatment instructions: Expect mild to moderate muscle soreness. S/S of pneumothorax if dry needled over a lung field, and to seek immediate medical attention should they occur. Patient verbalized understanding of these instructions and education.  Patient Consent Given: Yes Education handout provided: No  verbally reviewed information and pt has had dry needling in the past Muscles treated: bil cervical multifidi, suboccipitals, upper traps Electrical stimulation performed: No  Parameters: low frequency, low/mod intensity.  Treatment response/outcome: increased soft tissue length and pliability  Exercises: Horizontal abduction with blue theraband supine - 20x ROM as seen [id number]  Date: 06/05/2022 Manual: cervical and upper trap STM, lumbar spine  Trigger Point Dry-Needling  Treatment instructions: Expect mild to moderate muscle soreness. S/S of pneumothorax if dry needled over a lung field, and to seek immediate medical attention should they occur. Patient verbalized understanding of these instructions and education.  Patient Consent Given: Yes Education handout provided: No verbally reviewed information and pt has had dry needling in the past Muscles treated: bil cervical multifidi, suboccipitals, upper traps; Rt lumbar multifidi Electrical stimulation performed: No  Parameters: low frequency, low/mod intensity.  Treatment response/outcome: increased soft tissue length and pliability  Exercises: UBE x 4 min back/fwd 2 each Prone on green ball W, T, I - kneel on mat table - 10x  each Single knee to chest 3 x 10 sec Wall push up with chin tuck - 20x Horizontal abduction with green theraband at wall - foam roll behind back 20x Shoulder flexion with 3 lb - post pelvic tilt maintained - 20x Bilat ER with foam roll against wall - green theraband 20x      PATIENT EDUCATION:  Education details: [id number] Person educated: Patient Education method: Explanation, Demonstration, Tactile cues, Verbal cues, and Handouts Education comprehension: verbalized understanding and returned demonstration  HOME EXERCISE PROGRAM: Access Code: [email or web address] URL: [email or web address] Date: 05/24/2022 Prepared by: Jari Favre  Exercises - Seated Passive Cervical Retraction  - 1 x daily - 7 x weekly - 1 sets - 10 reps - Seated Cervical Flexion AROM  - 1 x daily - 7 x weekly - 1 sets - 3 reps - 30s holds - Seated Cervical Extension AROM  - 1 x daily - 7 x weekly - 1 sets - 3 reps - 30s holds - Seated Levator Scapulae Stretch  - 1 x daily - 7 x weekly - 1 sets - 3 reps - 30s holds - Seated Cervical Sidebending Stretch  - 1 x daily - 7 x weekly - 1 sets - 3 reps - 30s holds - Doorway Pec Stretch at 90 Degrees Abduction  - 1 x daily - 7 x weekly - 1 sets - 3 reps - 30s holds - Sidelying Thoracic Rotation with Open Book  - 1 x daily - 7 x weekly - 1 sets - 5 reps - 10 sec hold - Posterior Pelvic Tilt at [place]  - 1 x daily - 7 x weekly - 3 sets - 10 reps - Supine Single Knee to Chest Stretch  - 2 x daily - 7 x weekly - 1 sets - 5 reps - 10 sec hold  ASSESSMENT:  CLINICAL IMPRESSION: Pt reports he is maintaining progress and is ind with HEP.  Reviewed HEP today.  Dry needling and soft tissue release performed for maximum benefit of manual therapy for pain releif.  Pt will d/c with HEP today.   OBJECTIVE IMPAIRMENTS: decreased coordination, decreased endurance, decreased mobility, decreased strength, increased fascial restrictions, increased muscle spasms,  impaired flexibility, improper body mechanics, postural dysfunction, and pain.   ACTIVITY LIMITATIONS:  pt reports he is able to complete tasks but when headaches are worse he does need to lay down to relieve them  PARTICIPATION LIMITATIONS: community activity  PERSONAL FACTORS:  Fitness and Time since onset of injury/illness/exacerbation are also affecting patient's functional outcome.   REHAB POTENTIAL: Good  CLINICAL DECISION MAKING: Stable/uncomplicated  EVALUATION COMPLEXITY: Low   GOALS: Goals reviewed with patient? Yes  SHORT TERM GOALS: Target date: 05/15/2022   Pt to be I with HEP.  Baseline:  Goal status: MET  2.  Pt to report no more than 1-2 headaches per week to demonstrate decreased frequency of pain and improve QOL.  Baseline: down to 3-4 from 5 Goal status: IN PROGRESS  3.  Pt to report no more than 5/10 pain with headaches due to improved muscle length and postural strength.  Baseline: 4-5/10 Goal status: MET   LONG TERM GOALS: Target date:  07/19/22 Updated 06/12/22  Pt to be I with advanced HEP.  Baseline:  Goal status: MET  2.  Pt to report no more than 1-2 headaches per month to demonstrate decreased frequency of pain and improve QOL.  Baseline: 2 per week (down from daily) Goal status: MET  3.  Pt to report no more than 3/10 pain with headaches due to improved muscle length and postural strength.  Baseline: 2-3/10  Goal status: MET   4.  Pt to demonstrate normal cervical ROM without pain due to improved tissue mobility and length for decreased headaches..  Baseline:  Goal status: MET  5.  Pt to demonstrate ability to maintain upright posture for at least 30 mins without pain due to improved strength on postural muscles.  Baseline: I can sit for 30-45 minutres Goal status: Met   PLAN:  PT FREQUENCY: 1-2x/week  PT DURATION: 8 weeks  PLANNED INTERVENTIONS: Therapeutic exercises, Therapeutic activity, Neuromuscular re-education,  Patient/Family education, Self Care, Joint mobilization, Joint manipulation, Dry Needling, Electrical stimulation, Spinal mobilization, Cryotherapy, Moist heat, scar mobilization, Taping, Traction, Ultrasound, Ionotophoresis '4mg'$ /ml Dexamethasone, and Manual therapy  PLAN FOR NEXT SESSION: D/C today  Gustavus Bryant, PT, DPT 07/05/22 11:57 AM   PHYSICAL THERAPY DISCHARGE SUMMARY  Visits from Start of Care: 9  Current functional level related to goals / functional outcomes: See above   Remaining deficits: See above   Education / Equipment: HEP   Patient agrees to discharge. Patient goals were met. Patient is being discharged due to meeting the stated rehab goals.  Gustavus Bryant, PT, DPT 07/05/22 11:59 AM

## 2022-10-03 ENCOUNTER — Other Ambulatory Visit: Payer: Self-pay | Admitting: Thoracic Surgery (Cardiothoracic Vascular Surgery)

## 2022-10-03 DIAGNOSIS — C7A090 Malignant carcinoid tumor of the bronchus and lung: Secondary | ICD-10-CM

## 2022-10-30 ENCOUNTER — Encounter: Payer: Self-pay | Admitting: Neurology

## 2022-10-30 ENCOUNTER — Ambulatory Visit: Payer: Medicare Other | Admitting: Neurology

## 2022-10-30 ENCOUNTER — Ambulatory Visit (INDEPENDENT_AMBULATORY_CARE_PROVIDER_SITE_OTHER): Payer: Medicare Other | Admitting: Neurology

## 2022-10-30 VITALS — BP 130/75 | HR 80 | Ht 70.0 in | Wt 226.5 lb

## 2022-10-30 DIAGNOSIS — G4733 Obstructive sleep apnea (adult) (pediatric): Secondary | ICD-10-CM

## 2022-10-30 DIAGNOSIS — G44209 Tension-type headache, unspecified, not intractable: Secondary | ICD-10-CM | POA: Insufficient documentation

## 2022-10-30 DIAGNOSIS — M542 Cervicalgia: Secondary | ICD-10-CM

## 2022-10-30 NOTE — Progress Notes (Signed)
ASSESSMENT AND PLAN 72 y.o. year old male   Cervicogenic headache  Much improved now,  MRI cervical spine showed multilevel degenerative changes, most notable at C4-5, C5-6, C6-7, variable degree of foraminal narrowing  Continue neck stretching,    OSA on CPAP  Follow-up in 2025   DIAGNOSTIC DATA (LABS, IMAGING, TESTING) - I reviewed patient records, labs, notes, testing and imaging myself where available.    03/27/22 MRI cervical spine without contrast demonstrating - At C5-6 leftward disc protrusion with severe left foraminal stenosis. - At C6-7 disc bulging, uncovertebral joint and facet hypertrophy with severe right foraminal stenosis. - At C4-5 disc bulging and facet hypertrophy with moderate left foraminal stenosis.  HISTORY:  [name] is a 72 year old male, seen in request by his primary care physician Dr. Rodrigo Ran, for evaluation of occipital area headache initial evaluation was March 23, 2021   I reviewed and summarized the referring note. PMHx Right Lung Carcinoid tumor. OSA-CPAP.   Since March 2022, without clear triggers, he began to have frequent pressure headache, often started around noon, spreading forward, behind mild retro-orbital area temporal region pressure headache, lasting till nighttime, improved after he lie down, overnight sleep,  He denies lateralized motor or sensory deficit, tried Tylenol did not help, Advil helps some,   Denies a previous history of headache, denies gait abnormality, denies radiating pain to bilateral shoulder, no upper extremity paresthesia or weakness  There is no limitation in his daily activity, he is able to continue to play golf without much of the difficulty, but sudden jolt of movement, such as cough, will trigger a moment of intense headache, only lasting few seconds,  I personally reviewed MRI of the brain with without contrast February 06, 2021, mild age-related changes, no acute abnormality, no  evidence of significant cervical degenerative changes on the visible upper cervical region, no canal stenosis,   Because has history of obstructive sleep apnea, compliant with his CPAP machine, reported good sleep quality   UPDATE Jul 13 2021: He continues to complain of positional headaches since last visit in September, very good in the morning, gradually onset of headache in the afternoon, building up at the end of the day, improvement after overnight sleep, starting from occipital area, centered at the vertex region, pressure pain,  He did try Imitrex 50 mg as needed couple times, seems to help his headaches, he tolerated Inderal 40 mg twice a day without significant side effect  He suffered a chest cold, cough around Christmas time, significant headache with cough, become unbearable, was treated with Z-Pak, tapering dose of prednisone starting of 60 mg daily, just finished at the beginning of 2023, he reported significant improvement of his symptoms afterwards, has not had headache over the past 2 to 3 weeks   He denies any trigger before the gradual onset of headache since March 2022   UPDATE January 11 2022: He has been doing very well over the past few months, now has headache once to twice a week, mainly at bifrontal, behind eyes, no significant light noise sensitivity, lasting for couple hours, he is taking Tylenol as needed, works for his headache most of the time, his headache is better lying down, getting worse sitting up   When he travels overseas, he reported increased headaches,  He had long history of obstructive sleep apnea, has used CPAP machine for more than 10 years, but has not had it adjusted at least for over past 5 years, has  not had sleep study for more than 5 years,   He has narrow oropharyngeal space, excessive daytime fatigue and sleepiness sometimes,  UPDATE Oct 30 2022: He is headache overall has much improved, feel neck muscle tension sometimes, overall improved  with stretching, physical therapy, massage therapy, which has greatly helped his headache, rarely happen   Personally reviewed MRI cervical spine without contrast demonstrating - At C5-6 leftward disc protrusion with severe left foraminal stenosis. - At C6-7 disc bulging, uncovertebral joint and facet hypertrophy with severe right foraminal stenosis. - At C4-5 disc bulging and facet hypertrophy with moderate left foraminal stenosis.  He has been compliant with his CPAP machine, works well," cannot sleep without it" due for a new 1 in May 2025  REVIEW OF SYSTEMS: Out of a complete 14 system review of symptoms, the patient complains only of the following symptoms, and all other reviewed systems are negative.  See HPI  PHYSICAL EXAM Vitals:   10/30/22 0904  Weight: 226 lb 8 oz (102.7 kg)  Height: 5\' 10"  (1.778 m)      Gen: NAD, conversant, well nourised, well groomed                     Cardiovascular: Regular rate rhythm, no peripheral edema, warm, nontender. Eyes: Conjunctivae clear without exudates or hemorrhage Neck: Supple, no carotid bruits. Pulmonary: Clear to auscultation bilaterally   NEUROLOGICAL EXAM:  MENTAL STATUS: Speech/cognition: Awake, alert oriented to history taking and casual conversation  CRANIAL NERVES: CN II: Visual fields are full to confrontation.  Pupils are round equal and briskly reactive to light. CN III, IV, VI: extraocular movement are normal. No ptosis. CN V: Facial sensation is intact to pinprick in all 3 divisions bilaterally. Corneal responses are intact.  CN VII: Face is symmetric with normal eye closure and smile. CN VIII: Hearing is normal to casual conversation CN IX, X: Palate elevates symmetrically. Phonation is normal. CN XI: Head turning and shoulder shrug are intact CN XII: Tongue is midline with normal movements and no atrophy. Narrow space.  MOTOR: There is no pronator drift of out-stretched arms. Muscle bulk and tone are normal.  Muscle strength is normal.  REFLEXES: Reflexes are 2+ and symmetric at the biceps, triceps, knees, and ankles. Plantar responses are flexor.  SENSORY: Intact to light touch, pinprick, positional and vibratory sensation are intact in fingers and toes.  COORDINATION: Rapid alternating movements and fine finger movements are intact. There is no dysmetria on finger-to-nose and heel-knee-shin.    GAIT/STANCE: Posture is normal. Gait is steady   Romberg is absent.    ALLERGIES: Allergies  Allergen Reactions   Codeine Other (See Comments)    headache   Other     Other reaction(s): unable to sleep- Steroids    HOME MEDICATIONS: Outpatient Medications Prior to Visit  Medication Sig Dispense Refill   ibuprofen (ADVIL) 200 MG tablet Take 200 mg by mouth every 8 (eight) hours as needed. Takes 2- 3 tabs     Milk Thistle 1000 MG CAPS Take 1,000 mg by mouth daily.      rosuvastatin (CRESTOR) 10 MG tablet Take 10 mg by mouth daily in the afternoon.      triamcinolone cream (KENALOG) 0.1 % Apply 1 application topically daily. 454 g 1   OZEMPIC, 1 MG/DOSE, 4 MG/3ML SOPN Inject 1 mg into the skin once a week.     No facility-administered medications prior to visit.    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   Eczema 08/08/2010   Family history of adverse reaction to anesthesia    mom- post op N/V   Hyperlipidemia    PONV (postoperative nausea and vomiting)    Sleep apnea    uses CPAP nightly   Unilateral primary osteoarthritis, right knee 07/30/2019    PAST SURGICAL HISTORY: Past Surgical History:  Procedure Laterality Date   COLONOSCOPY     hx polyps   EYE SURGERY     lasik   INTERCOSTAL NERVE BLOCK  01/20/2019   Procedure: Intercostal Nerve Block;  Surgeon: Delight Ovens, MD;  Location: [facility] OR;  Service: Thoracic;;   KNEE  ARTHROSCOPY Right    SEPTOPLASTY  1980   x 2   TONSILLECTOMY AND ADENOIDECTOMY  1960   TOTAL KNEE ARTHROPLASTY Right 07/30/2019   TOTAL KNEE ARTHROPLASTY Right 07/30/2019   Procedure: RIGHT TOTAL KNEE ARTHROPLASTY;  Surgeon: Kathryne Hitch, MD;  Location: [facility] OR;  Service: Orthopedics;  Laterality: Right;   VIDEO ASSISTED THORACOSCOPY (VATS)/WEDGE RESECTION Right 01/20/2019   Procedure: VIDEO ASSISTED THORACOSCOPY (VATS) WITH RESECTION OF RIGHT INTRAPULMONARY LYMPH NODE;  Surgeon: Delight Ovens, MD;  Location: [facility] OR;  Service: Thoracic;  Laterality: Right;   VIDEO BRONCHOSCOPY N/A 01/20/2019   Procedure: VIDEO BRONCHOSCOPY;  Surgeon: Delight Ovens, MD;  Location: [facility] OR;  Service: Thoracic;  Laterality: N/A;    FAMILY HISTORY: Family History  Problem Relation Age of Onset   Heart disease Mother        CABG age 8s   Stomach cancer Neg Hx    Sleep apnea Neg Hx    Colon cancer Neg Hx    Colon polyps Neg Hx    Esophageal cancer Neg Hx    Rectal cancer Neg Hx     SOCIAL HISTORY: Social History   Socioeconomic History   Marital status: Married    Spouse name: Not on file   Number of children: 2   Years of education: Not on file   Highest education level: Not on file  Occupational History   Not on file  Tobacco Use   Smoking status: Never   Smokeless tobacco: Never  Vaping Use   Vaping Use: Never used  Substance and Sexual Activity   Alcohol use: Yes    Alcohol/week: 14.0 standard drinks of alcohol    Types: 14 Glasses of wine per week    Comment: social   Drug use: No   Sexual activity: Yes  Other Topics Concern   Not on file  Social History Narrative   Married, lives with wife.    Right Handed   Drinks hot tea   Social Determinants of Health   Financial Resource Strain: Not on file  Food Insecurity: Not on file  Transportation Needs: Not on file  Physical Activity: Not on file  Stress: Not on file  Social Connections: Not on file  Intimate  Partner Violence: Not on file     Levert Feinstein, M.D. Ph.D.  [facility] [address] [place], [place] [address] Phone: [phone] Fax:      [phone]

## 2022-11-16 ENCOUNTER — Ambulatory Visit
Admission: RE | Admit: 2022-11-16 | Discharge: 2022-11-16 | Disposition: A | Discharge status: Home / Self Care | Payer: Medicare Other | Source: Ambulatory Visit | Attending: Thoracic Surgery (Cardiothoracic Vascular Surgery) | Admitting: Thoracic Surgery (Cardiothoracic Vascular Surgery)

## 2022-11-16 ENCOUNTER — Ambulatory Visit (INDEPENDENT_AMBULATORY_CARE_PROVIDER_SITE_OTHER): Payer: Medicare Other | Admitting: Thoracic Surgery (Cardiothoracic Vascular Surgery)

## 2022-11-16 DIAGNOSIS — C7A090 Malignant carcinoid tumor of the bronchus and lung: Secondary | ICD-10-CM

## 2022-11-16 NOTE — Progress Notes (Signed)
     [address]       [place] [address]             [phone]       Patient: Home Provider: Office Consent for Telemedicine visit obtained.  Today's visit was completed via a real-time telehealth (see specific modality noted below). The patient/authorized person provided oral consent at the time of the visit to engage in a telemedicine encounter with the present provider at [facility]. The patient/authorized person was informed of the potential benefits, limitations, and risks of telemedicine. The patient/authorized person expressed understanding that the laws that protect confidentiality also apply to telemedicine. The patient/authorized person acknowledged understanding that telemedicine does not provide emergency services and that he or she would need to call 911 or proceed to the nearest hospital for help if such a need arose.   Total time spent in the clinical discussion 10 minutes.  Telehealth Modality: Phone visit (audio only)  IMPRESSION: Small linear pleural-based densities in the lateral aspect of right lower lung field have not changed suggesting scarring. There are no new nodules or new infiltrates in the lung fields. No new significant lymphadenopathy is seen.   Coronary artery disease.  Aortic atherosclerosis.  I had a telephone visit with [name].  Overall he is doing well.  He has no complaints since his last appointment.  Images were reviewed and are stable.   He is status post the right VATS with wedge resection of intralobar lymph nodes. This was done by Dr. Tyrone Sage in 2020.  Pathology was consistent with carcinoid tumor. He had his annual surveillance CT scan which showed no recurrence or new disease. He has no complaints. He will follow-up in a year with another CT chest.

## 2023-03-05 ENCOUNTER — Ambulatory Visit: Payer: Medicare Other | Admitting: Neurology

## 2023-05-13 ENCOUNTER — Ambulatory Visit (INDEPENDENT_AMBULATORY_CARE_PROVIDER_SITE_OTHER): Payer: Medicare Other | Admitting: Neurology

## 2023-05-13 ENCOUNTER — Encounter: Payer: Self-pay | Admitting: Neurology

## 2023-05-13 VITALS — BP 127/85 | HR 69 | Ht 71.0 in | Wt 217.0 lb

## 2023-05-13 DIAGNOSIS — G4733 Obstructive sleep apnea (adult) (pediatric): Secondary | ICD-10-CM

## 2023-05-13 NOTE — Progress Notes (Signed)
Subjective:    Patient ID: [name] is a 72 y.o. male.  HPI    Interim history:   [name] is a 72 year old right-handed gentleman with an underlying medical history of arthritis, carcinoid tumor of the right lung, eczema, recurrent headaches, reflux disease, hyperlipidemia, osteoarthritis, and mild obesity, who presents for follow-up consultation of his obstructive sleep apnea, on AutoPap therapy.  The patient is unaccompanied today and presents for his 1 year checkup.  I first met him at the request of Dr. Terrace Arabia on 03/05/2022, at which time he reported a prior diagnosis of obstructive sleep apnea.  He was on AutoPap therapy and compliant with treatment with good apnea control and good tolerance.  He had received a new PAP machine in May 2020.  He was advised to follow-up routinely in 1 year.  Today, 05/13/2023: I reviewed his AutoPap compliance data from 04/09/2023 through 05/08/2023, which is a total of 30 days, during which time he used his machine every night with percent use days greater than 4 hours at 100%, indicating superb compliance with an average usage of 9 hours and 53 minutes, residual AHI at goal at 0.1/h, 95th percentile of pressure at 12 cm with a range of 9 to 20 cm with EPR of 2.  Leak on the lower side with the 95th percentile at 4.4 L/min.  He reports doing well, no new concerns, up-to-date with his supplies through [facility] [employer].  He changes his filter regularly and uses the nasal pillows interface without trouble, uses the humidifier, with distilled water.  He has no new concerns today.  Had a physical with Dr. Waynard Edwards about 3 weeks ago.  No new medications.  His headaches have essentially resolved thankfully.  His Epworth sleepiness score is 2 out of 24 today.  He saw Dr. Terrace Arabia about 6 months ago and was doing well at the time.  I reviewed her office visit note from 10/30/2022.   Previously:  03/05/2022: (He) was previously diagnosed with obstructive sleep apnea and placed on PAP  therapy.  I reviewed your office note from 01/11/2022.  Prior sleep study results are not available for my review today.  Original sleep apnea was diagnosed several years ago and last sleep study was over 15 years ago.  He received a new CPAP machine in May 2020.  I was able to review his compliance data for the past 90 days from 12/06/2021 through 03/05/2022, during which time he used his machine every night, has not used it tonight yet of course.  He is on 100% compliant with an average usage of 9 hours and 38 minutes, residual AHI at goal at 0.1/h, treatment range of 9 to 20 cm with EPR.  He has a ResMed AirSense 10 AutoSet machine.  His 95th percentile of pressure is right at 11 cm, leak acceptable with the 95th percentile at 8.4 L/min.  His DME company is [employer].  His primary care prescribing machine.  He does not currently have a sleep specialist.  His Epworth sleepiness score is 4 out of 24, fatigue severity score is 14 out of 63.  He lives with his wife.  He uses nasal pillows.  Goes to bed around 9:30 PM or 10 PM and rise time is generally between 7 and 7:30 AM.  He does not have night to night nocturia and denies recurrent morning headaches.  He does not drink coffee or soda daily, typically tea maybe 1 or 2/day.  He is a non-smoker and drinks alcohol  in the form of wine, 1 or 2 glasses daily.  He had a tonsillectomy and adenoidectomy as a child.  His Past Medical History Is Significant For: Past Medical History:  Diagnosis Date   Arthritis    knee, no meds   Cancer (HCC) 2020   carcinoid tumor of right lung   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   Eczema 08/08/2010   Family history of adverse reaction to anesthesia    mom- post op N/V   Hyperlipidemia    PONV (postoperative nausea and vomiting)    Sleep apnea    uses CPAP nightly   Unilateral primary osteoarthritis, right knee 07/30/2019    His Past Surgical History Is Significant For: Past Surgical History:  Procedure Laterality Date    COLONOSCOPY     hx polyps   EYE SURGERY     lasik   INTERCOSTAL NERVE BLOCK  01/20/2019   Procedure: Intercostal Nerve Block;  Surgeon: Delight Ovens, MD;  Location: [facility] OR;  Service: Thoracic;;   KNEE ARTHROSCOPY Right    SEPTOPLASTY  1980   x 2   TONSILLECTOMY AND ADENOIDECTOMY  1960   TOTAL KNEE ARTHROPLASTY Right 07/30/2019   TOTAL KNEE ARTHROPLASTY Right 07/30/2019   Procedure: RIGHT TOTAL KNEE ARTHROPLASTY;  Surgeon: Kathryne Hitch, MD;  Location: [facility] OR;  Service: Orthopedics;  Laterality: Right;   VIDEO ASSISTED THORACOSCOPY (VATS)/WEDGE RESECTION Right 01/20/2019   Procedure: VIDEO ASSISTED THORACOSCOPY (VATS) WITH RESECTION OF RIGHT INTRAPULMONARY LYMPH NODE;  Surgeon: Delight Ovens, MD;  Location: [facility] OR;  Service: Thoracic;  Laterality: Right;   VIDEO BRONCHOSCOPY N/A 01/20/2019   Procedure: VIDEO BRONCHOSCOPY;  Surgeon: Delight Ovens, MD;  Location: [facility] OR;  Service: Thoracic;  Laterality: N/A;    His Family History Is Significant For: Family History  Problem Relation Age of Onset   Heart disease Mother        CABG age 70s   Stomach cancer Neg Hx    Sleep apnea Neg Hx    Colon cancer Neg Hx    Colon polyps Neg Hx    Esophageal cancer Neg Hx    Rectal cancer Neg Hx     His Social History Is Significant For: Social History   Socioeconomic History   Marital status: Married    Spouse name: Not on file   Number of children: 2   Years of education: Not on file   Highest education level: Not on file  Occupational History   Not on file  Tobacco Use   Smoking status: Never   Smokeless tobacco: Never  Vaping Use   Vaping status: Never Used  Substance and Sexual Activity   Alcohol use: Yes    Alcohol/week: 12.0 standard drinks of alcohol    Types: 12 Glasses of wine per week    Comment: social   Drug use: No   Sexual activity: Yes  Other Topics Concern   Not on file  Social History Narrative   Married, lives with wife.    Right Handed    Drinks hot tea   Social Determinants of Health   Financial Resource Strain: Not on file  Food Insecurity: Low Risk  (11/29/2022)   Received from [facility]   Hunger Vital Sign    Worried About Running Out of Food in the Last Year: Never true    Ran Out of Food in the Last Year: Never true  Transportation Needs: Not on file (11/29/2022)  Physical Activity: Not on file  Stress: Not on file  Social Connections: Not on file    His Allergies Are:  Allergies  Allergen Reactions   Codeine Other (See Comments)    headache   Other     Other reaction(s): unable to sleep- Steroids  :   His Current Medications Are:  Outpatient Encounter Medications as of 05/13/2023  Medication Sig   ibuprofen (ADVIL) 200 MG tablet Take 200 mg by mouth every 8 (eight) hours as needed. Takes 2- 3 tabs   Milk Thistle 1000 MG CAPS Take 1,000 mg by mouth daily.    rosuvastatin (CRESTOR) 10 MG tablet Take 10 mg by mouth daily in the afternoon.    triamcinolone cream (KENALOG) 0.1 % Apply 1 application topically daily.   No facility-administered encounter medications on file as of 05/13/2023.  :  Review of Systems:  Out of a complete 14 point review of systems, all are reviewed and negative with the exception of these symptoms as listed below:  Review of Systems  Neurological:        Pt here for cpap f/u Pt states no questions or concerns for todays visit    ESS    Objective:  Neurological Exam  Physical Exam Physical Examination:   Vitals:   05/13/23 0743  BP: 127/85  Pulse: 69    General Examination: The patient is a very pleasant 72 y.o. male in no acute distress. He appears well-developed and well-nourished and well groomed.   HEENT: Normocephalic, atraumatic, pupils are equal, round and reactive to light, extraocular tracking is well-preserved.  Face is symmetric with normal facial animation, speech is clear without dysarthria, hypophonia or voice tremor.  Oropharynx exam reveals: No  significant mouth dryness, adequate dental hygiene and mild airway crowding, tonsils absent.  Mallampati class II.  Tongue protrudes centrally and palate elevates symmetrically.   Chest: Clear to auscultation without wheezing, rhonchi or crackles noted.   Heart: S1+S2+0, regular and normal without murmurs, rubs or gallops noted.    Abdomen: Soft, non-tender and non-distended.   Extremities: There is no obvious swelling in the distal lower extremities bilaterally.    Skin: Warm and dry without trophic changes noted.    Musculoskeletal: exam reveals no obvious joint deformities.    Neurologically:  Mental status: The patient is awake, alert and oriented in all 4 spheres. His immediate and remote memory, attention, language skills and fund of knowledge are appropriate. There is no evidence of aphasia, agnosia, apraxia or anomia. Speech is clear with normal prosody and enunciation. Thought process is linear. Mood is normal and affect is normal.  Cranial nerves II - XII are as described above under HEENT exam.  Motor exam: Normal bulk, strength and tone is noted. There is no obvious resting or action tremor. Fine motor skills and coordination: grossly intact.  Cerebellar testing: No dysmetria or intention tremor. There is no truncal or gait ataxia.  Sensory exam: intact to light touch in the upper and lower extremities.  Gait, station and balance: He stands easily. No veering to one side is noted. No leaning to one side is noted. Posture is age-appropriate and stance is narrow based. Gait shows normal stride length and normal pace. No problems turning are noted.    Assessment and Plan:  In summary, ERIKSON WRAGG is a very pleasant 72 year old male with an underlying medical history of arthritis, carcinoid tumor of the right lung, eczema, recurrent headaches, reflux disease, hyperlipidemia, osteoarthritis, and mild obesity, who presents for follow-up consultation of  his obstructive sleep apnea,  well-established on AutoPap therapy.  He is up-to-date with his supplies, he is compliant with treatment and continues to benefit from it.  He has no issues with tolerance of treatment.  He is commended for his treatment adherence.  He is advised to follow-up routinely in this clinic in 1 year to see one of our nurse practitioners.  He has an appointment for follow-up in May 2025 and since his headaches are improved, I suggest that he could push out the follow-up appointment to 1 year instead of 6 months.  I answered all his questions today and he was in agreement with our plan.   I spent 30 minutes in total face-to-face time and in reviewing records during pre-charting, more than 50% of which was spent in counseling and coordination of care, reviewing test results, reviewing medications and treatment regimen and/or in discussing or reviewing the diagnosis of OSA, the prognosis and treatment options. Pertinent laboratory and imaging test results that were available during this visit with the patient were reviewed by me and considered in my medical decision making (see chart for details).

## 2023-10-04 ENCOUNTER — Other Ambulatory Visit: Payer: Self-pay | Admitting: Thoracic Surgery (Cardiothoracic Vascular Surgery)

## 2023-10-04 DIAGNOSIS — C7A090 Malignant carcinoid tumor of the bronchus and lung: Secondary | ICD-10-CM

## 2023-10-30 ENCOUNTER — Ambulatory Visit: Payer: Medicare Other | Admitting: Neurology

## 2023-11-14 ENCOUNTER — Ambulatory Visit
Admission: RE | Admit: 2023-11-14 | Discharge: 2023-11-14 | Disposition: A | Discharge status: Home / Self Care | Source: Ambulatory Visit | Attending: Thoracic Surgery (Cardiothoracic Vascular Surgery) | Admitting: Thoracic Surgery (Cardiothoracic Vascular Surgery)

## 2023-11-14 DIAGNOSIS — C7A090 Malignant carcinoid tumor of the bronchus and lung: Secondary | ICD-10-CM

## 2023-11-22 ENCOUNTER — Telehealth: Admitting: Thoracic Surgery (Cardiothoracic Vascular Surgery)

## 2023-12-20 ENCOUNTER — Ambulatory Visit
Attending: Thoracic Surgery (Cardiothoracic Vascular Surgery) | Admitting: Thoracic Surgery (Cardiothoracic Vascular Surgery)

## 2023-12-20 DIAGNOSIS — C7A090 Malignant carcinoid tumor of the bronchus and lung: Secondary | ICD-10-CM | POA: Diagnosis not present

## 2023-12-20 NOTE — Progress Notes (Signed)
     [address]       [place] [address]             [phone]       Patient: Home Provider: Office Consent for Telemedicine visit obtained.  Today's visit was completed via a real-time telehealth (see specific modality noted below). The patient/authorized person provided oral consent at the time of the visit to engage in a telemedicine encounter with the present provider at [facility]. The patient/authorized person was informed of the potential benefits, limitations, and risks of telemedicine. The patient/authorized person expressed understanding that the laws that protect confidentiality also apply to telemedicine. The patient/authorized person acknowledged understanding that telemedicine does not provide emergency services and that he or she would need to call 911 or proceed to the nearest hospital for help if such a need arose.   Total time spent in the clinical discussion 10 minutes.  Telehealth Modality: Phone visit (audio only)  I had a telephone visit with Dorn GORMAN [place] he is doing well.  He has no complaints since his last appointment.  Images were reviewed and are stable.   He is status post the right VATS with wedge resection of intralobar lymph nodes. This was done by Dr. Army in 2020.  Pathology was consistent with carcinoid tumor. He had his annual surveillance CT scan which showed no recurrence or new disease. He has no complaints. He will continue to follow-up our lung cancer surveillance program.

## 2024-01-05 ENCOUNTER — Other Ambulatory Visit: Payer: Self-pay | Admitting: Internal Medicine

## 2024-01-05 DIAGNOSIS — C7A090 Malignant carcinoid tumor of the bronchus and lung: Secondary | ICD-10-CM

## 2024-01-06 ENCOUNTER — Inpatient Hospital Stay: Attending: Internal Medicine | Admitting: Internal Medicine

## 2024-01-06 ENCOUNTER — Inpatient Hospital Stay

## 2024-01-06 VITALS — BP 131/84 | HR 68 | Temp 98.1°F | Resp 17 | Ht 71.0 in | Wt 224.0 lb

## 2024-01-06 DIAGNOSIS — Z85110 Personal history of malignant carcinoid tumor of bronchus and lung: Secondary | ICD-10-CM | POA: Insufficient documentation

## 2024-01-06 DIAGNOSIS — Z902 Acquired absence of lung [part of]: Secondary | ICD-10-CM | POA: Diagnosis not present

## 2024-01-06 DIAGNOSIS — C7A090 Malignant carcinoid tumor of the bronchus and lung: Secondary | ICD-10-CM

## 2024-01-06 LAB — CBC WITH DIFFERENTIAL (CANCER CENTER ONLY)
Abs Immature Granulocytes: 0.02 K/uL (ref 0.00–0.07)
Basophils Absolute: 0 K/uL (ref 0.0–0.1)
Basophils Relative: 1 %
Eosinophils Absolute: 0.1 K/uL (ref 0.0–0.5)
Eosinophils Relative: 2 %
HCT: 39.1 % (ref 39.0–52.0)
Hemoglobin: 13.2 g/dL (ref 13.0–17.0)
Immature Granulocytes: 0 %
Lymphocytes Relative: 22 %
Lymphs Abs: 1 K/uL (ref 0.7–4.0)
MCH: 31 pg (ref 26.0–34.0)
MCHC: 33.8 g/dL (ref 30.0–36.0)
MCV: 91.8 fL (ref 80.0–100.0)
Monocytes Absolute: 0.5 K/uL (ref 0.1–1.0)
Monocytes Relative: 11 %
Neutro Abs: 3 K/uL (ref 1.7–7.7)
Neutrophils Relative %: 64 %
Platelet Count: 203 K/uL (ref 150–400)
RBC: 4.26 MIL/uL (ref 4.22–5.81)
RDW: 12.9 % (ref 11.5–15.5)
WBC Count: 4.7 K/uL (ref 4.0–10.5)
nRBC: 0 % (ref 0.0–0.2)

## 2024-01-06 LAB — CMP (CANCER CENTER ONLY)
ALT: 17 U/L (ref 0–44)
AST: 17 U/L (ref 15–41)
Albumin: 4 g/dL (ref 3.5–5.0)
Alkaline Phosphatase: 60 U/L (ref 38–126)
Anion gap: 5 (ref 5–15)
BUN: 16 mg/dL (ref 8–23)
CO2: 31 mmol/L (ref 22–32)
Calcium: 9.3 mg/dL (ref 8.9–10.3)
Chloride: 104 mmol/L (ref 98–111)
Creatinine: 1.26 mg/dL — ABNORMAL HIGH (ref 0.61–1.24)
GFR, Estimated: >60 mL/min (ref >60)
Glucose, Bld: 103 mg/dL — ABNORMAL HIGH (ref 70–99)
Potassium: 4 mmol/L (ref 3.5–5.1)
Sodium: 140 mmol/L (ref 135–145)
Total Bilirubin: 0.8 mg/dL (ref 0.0–1.2)
Total Protein: 6.2 g/dL — ABNORMAL LOW (ref 6.5–8.1)

## 2024-01-06 NOTE — Progress Notes (Signed)
 [facility] [phone]   [phone]  CONSULT NOTE  REFERRING PHYSICIAN: Dr. Linnie Rayas  REASON FOR CONSULTATION:  73 years old white male with history of carcinoid tumor.  HPI [name] is a 73 y.o. male.   Discussed the use of AI scribe software for clinical note transcription with the patient, who gave verbal consent to proceed.  History of Present Illness [name] is a 73 year old male with a history of carcinoid tumor who presents for follow-up after surgical removal. He was referred by Dr. Rayas for follow-up with a medical oncologist after surgical removal of a carcinoid tumor.  In June 2020, a CT scan for cardiac calcium  scoring revealed a 1.7 cm nodule in the right lower lobe of the lung. A follow-up CT scan a week later showed the nodule measured 1.6 by 1.1 cm. A PET scan on December 25, 2018, showed the nodule was suspicious for cancer. He subsequently underwent surgical removal on January 20, 2019. Pathology confirmed a typical carcinoid tumor, a low-grade neuroendocrine carcinoma.  Since the surgery, he has been monitored with annual scans. The most recent scan on Nov 14, 2023, showed no residual tumor or concerning findings. He feels 'terrific' with no complaints, including no chest pain, breathing issues, coughing, weight loss, night sweats, or diarrhea.  His past medical history includes arthritis, high cholesterol, sleep apnea, eczema, and prediabetes. He denies any history of heart attacks, strokes, or diabetes. He is allergic to codeine, which causes headaches.  Family history is significant for heart disease in his mother, while his father had diverticulitis. There is no family history of cancer.  Socially, he is married with two children and retired from [employer] in 2018. He drinks alcohol five out of seven days, typically two glasses of wine per night, and has never smoked.   HPI  Past Medical  History:  Diagnosis Date   Arthritis    knee, no meds   Cancer (HCC) 2020   carcinoid tumor of right lung   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   Eczema 08/08/2010   Family history of adverse reaction to anesthesia    mom- post op N/V   Hyperlipidemia    PONV (postoperative nausea and vomiting)    Sleep apnea    uses CPAP nightly   Unilateral primary osteoarthritis, right knee 07/30/2019       Past Surgical History:  Procedure Laterality Date   COLONOSCOPY     hx polyps   EYE SURGERY     lasik   INTERCOSTAL NERVE BLOCK  01/20/2019   Procedure: Intercostal Nerve Block;  Surgeon: Army Dallas NOVAK, MD;  [facility];  Service: Thoracic;;   KNEE ARTHROSCOPY Right    SEPTOPLASTY  1980   x 2   TONSILLECTOMY AND ADENOIDECTOMY  1960   TOTAL KNEE ARTHROPLASTY Right 07/30/2019   TOTAL KNEE ARTHROPLASTY Right 07/30/2019   Procedure: RIGHT TOTAL KNEE ARTHROPLASTY;  Surgeon: Vernetta Lonni GRADE, MD;  Location: [facility] OR;  Service: Orthopedics;  Laterality: Right;   VIDEO ASSISTED THORACOSCOPY (VATS)/WEDGE RESECTION Right 01/20/2019   Procedure: VIDEO ASSISTED THORACOSCOPY (VATS) WITH RESECTION OF RIGHT INTRAPULMONARY LYMPH NODE;  Surgeon: Army Dallas NOVAK, MD;  Location: [facility] OR;  Service: Thoracic;  Laterality: Right;   VIDEO BRONCHOSCOPY N/A 01/20/2019   Procedure: VIDEO BRONCHOSCOPY;  Surgeon: Army Dallas NOVAK, MD;  Location: [facility] OR;  Service: Thoracic;  Laterality: N/A;    Family History  Problem Relation Age  of Onset   Heart disease Mother        CABG age 65s   Stomach cancer Neg Hx    Sleep apnea Neg Hx    Colon cancer Neg Hx    Colon polyps Neg Hx    Esophageal cancer Neg Hx    Rectal cancer Neg Hx     Social History Social History   Tobacco Use   Smoking status: Never   Smokeless tobacco: Never  Vaping Use   Vaping status: Never Used  Substance Use Topics   Alcohol use: Yes    Alcohol/week: 12.0 standard drinks of alcohol    Types: 12 Glasses of wine per week     Comment: social   Drug use: No    Allergies  Allergen Reactions   Codeine Other (See Comments)    headache   Other     Other reaction(s): unable to sleep- Steroids    Current Outpatient Medications  Medication Sig Dispense Refill   ibuprofen (ADVIL) 200 MG tablet Take 200 mg by mouth every 8 (eight) hours as needed. Takes 2- 3 tabs     Milk Thistle 1000 MG CAPS Take 1,000 mg by mouth daily.      rosuvastatin  (CRESTOR ) 10 MG tablet Take 10 mg by mouth daily in the afternoon.      triamcinolone  cream (KENALOG ) 0.1 % Apply 1 application topically daily. 454 g 1   No current facility-administered medications for this visit.    Review of Systems  Constitutional: negative Eyes: negative Ears, nose, mouth, throat, and face: negative Respiratory: negative Cardiovascular: negative Gastrointestinal: negative Genitourinary:negative Integument/breast: negative Hematologic/lymphatic: negative Musculoskeletal:negative Neurological: negative Behavioral/Psych: negative Endocrine: negative Allergic/Immunologic: negative  Physical Exam  MJO:jozmu, healthy, no distress, well nourished, and well developed SKIN: skin color, texture, turgor are normal, no rashes or significant lesions HEAD: Normocephalic, No masses, lesions, tenderness or abnormalities EYES: normal, PERRLA, Conjunctiva are pink and non-injected EARS: External ears normal, Canals clear OROPHARYNX:no exudate, no erythema, and lips, buccal mucosa, and tongue normal  NECK: supple, no adenopathy, no JVD LYMPH:  no palpable lymphadenopathy, no hepatosplenomegaly LUNGS: clear to auscultation , and palpation HEART: regular rate & rhythm, no murmurs, and no gallops ABDOMEN:abdomen soft, non-tender, normal bowel sounds, and no masses or organomegaly BACK: Back symmetric, no curvature., No CVA tenderness EXTREMITIES:no joint deformities, effusion, or inflammation, no edema  NEURO: alert & oriented x 3 with fluent speech, no  focal motor/sensory deficits  PERFORMANCE STATUS: ECOG 1  LABORATORY DATA: Lab Results  Component Value Date   WBC 4.7 01/06/2024   HGB 13.2 01/06/2024   HCT 39.1 01/06/2024   MCV 91.8 01/06/2024   PLT 203 01/06/2024      Chemistry      Component Value Date/Time   NA 140 01/06/2024 1340   K 4.0 01/06/2024 1340   CL 104 01/06/2024 1340   CO2 31 01/06/2024 1340   BUN 16 01/06/2024 1340   CREATININE 1.26 (H) 01/06/2024 1340      Component Value Date/Time   CALCIUM  9.3 01/06/2024 1340   ALKPHOS 60 01/06/2024 1340   AST 17 01/06/2024 1340   ALT 17 01/06/2024 1340   BILITOT 0.8 01/06/2024 1340       RADIOGRAPHIC STUDIES: No results found.  ASSESSMENT: This is a very pleasant 73 years old white male diagnosed with stage Ia (T1b, N0, M0) low-grade neuroendocrine carcinoma, typical carcinoid status post wedge resection of the right lower lobe on January 20, 2019 under the care  of Dr. Army and the patient has been on observation since that time. He was followed by regular CT scan over the last 5 years and no evidence for disease recurrence on the most recent CT scan of the chest on Nov 14, 2023.  PLAN: Assessment and Plan Assessment & Plan Stage 1 typical carcinoid tumor of the right lower lobe of the lung Initially discovered in June 2020 and surgically removed in July 2020. Follow-up scans over the past five years, including the most recent in May 2025, show no residual tumor or concerning findings. Typical carcinoid tumors are low-grade and slow-growing, with a low likelihood of recurrence. - Discontinue regular annual scans due to low risk of recurrence and potential radiation exposure. - Recommend periodic chest X-rays as part of routine check-ups by primary care physician. - Return for evaluation if any concerning symptoms or findings arise.  Arthritis  Hyperlipidemia  Prediabetes  Obstructive sleep apnea  Eczema  The patient voices understanding of current  disease status and treatment options and is in agreement with the current care plan.  All questions were answered. The patient knows to call the clinic with any problems, questions or concerns. We can certainly see the patient much sooner if necessary.  Thank you so much for allowing me to participate in the care of ZACKRY DEINES. I will continue to follow up the patient with you and assist in his care. The total time spent in the appointment was 60 minutes including review of chart and various tests results, discussions about plan of care and coordination of care plan .   Disclaimer: This note was dictated with voice recognition software. Similar sounding words can inadvertently be transcribed and may not be corrected upon review.   Sherrod MARLA Sherrod January 06, 2024, 3:00 PM

## 2024-05-11 NOTE — Progress Notes (Unsigned)
 [name]

## 2024-05-12 ENCOUNTER — Encounter: Payer: Self-pay | Admitting: Neurology

## 2024-05-12 ENCOUNTER — Ambulatory Visit: Payer: Medicare Other | Admitting: Neurology

## 2024-05-12 VITALS — BP 153/90 | HR 96 | Ht 70.0 in | Wt 216.6 lb

## 2024-05-12 DIAGNOSIS — G4733 Obstructive sleep apnea (adult) (pediatric): Secondary | ICD-10-CM

## 2024-05-12 NOTE — Progress Notes (Signed)
 Patient: [name] Date of Birth: March 25, 1951  Reason for Visit: Follow up History from: Patient Primary Neurologist: Sheralyn  ASSESSMENT AND PLAN 73 y.o. year old male   1.  New onset headaches 2.  Cervicogenic headache - Resolved  3.  OSA on CPAP (setup May 2020 established with Dr. Buck in 2023, prior sleep study was not available) - Excellent CPAP compliance and benefit - Eligible for a new machine, order HST, after results and new CPAP, follow up in initial compliance period - Continue CPAP use nightly minimum 4 hours, continue current settings  Orders Placed This Encounter  Procedures   Home sleep test   HISTORY  [name] is a 73 year old male, seen in request by his primary care physician Dr. Shayne Anes, for evaluation of occipital area headache initial evaluation was March 23, 2021   I reviewed and summarized the referring note. PMHx Right Lung Carcinoid tumor. OSA-CPAP.   Since March 2022, without clear triggers, he began to have frequent pressure headache, often started around noon, spreading forward, behind mild retro-orbital area temporal region pressure headache, lasting till nighttime, improved after he lie down, overnight sleep,  He denies lateralized motor or sensory deficit, tried Tylenol  did not help, Advil helps some,   Denies a previous history of headache, denies gait abnormality, denies radiating pain to bilateral shoulder, no upper extremity paresthesia or weakness  There is no limitation in his daily activity, he is able to continue to play golf without much of the difficulty, but sudden jolt of movement, such as cough, will trigger a moment of intense headache, only lasting few seconds,  I personally reviewed MRI of the brain with without contrast February 06, 2021, mild age-related changes, no acute abnormality, no evidence of significant cervical degenerative changes on the visible upper cervical region, no canal stenosis,    Because has history of obstructive sleep apnea, compliant with his CPAP machine, reported good sleep quality   UPDATE Jul 13 2021: He continues to complain of positional headaches since last visit in September, very good in the morning, gradually onset of headache in the afternoon, building up at the end of the day, improvement after overnight sleep, starting from occipital area, centered at the vertex region, pressure pain,  He did try Imitrex  50 mg as needed couple times, seems to help his headaches, he tolerated Inderal  40 mg twice a day without significant side effect  He suffered a chest cold, cough around Christmas time, significant headache with cough, become unbearable, was treated with Z-Pak, tapering dose of prednisone starting of 60 mg daily, just finished at the beginning of 2023, he reported significant improvement of his symptoms afterwards, has not had headache over the past 2 to 3 weeks   He denies any trigger before the gradual onset of headache since March 2022   UPDATE January 11 2022: He has been doing very well over the past few months, now has headache once to twice a week, mainly at bifrontal, behind eyes, no significant light noise sensitivity, lasting for couple hours, he is taking Tylenol  as needed, works for his headache most of the time, his headache is better lying down, getting worse sitting up   When he travels [place], he reported increased headaches,  He had long history of obstructive sleep apnea, has used CPAP machine for more than 10 years, but has not had it adjusted at least for over past 5 years, has not had sleep study for more than 5 years,  He has narrow oropharyngeal space, excessive daytime fatigue and sleepiness sometimes,  Update April 17, 2022 SS: saw Dr. Buck for sleep consult Sept 2023, not eligible for new equipment until May 2025 for CPAP.  Encouraged to continue his current settings. Last visit in July 2023, stopped Inderal . Headaches have  improved, but continue, base of neck, radiates around like a cap. No migraine features, happens 2 days a week, will lie down take Aleve, with good benefit.  Denies any radicular symptoms to either arm.  He is retired.  03/27/22 MRI cervical spine without contrast demonstrating - At C5-6 leftward disc protrusion with severe left foraminal stenosis. - At C6-7 disc bulging, uncovertebral joint and facet hypertrophy with severe right foraminal stenosis. - At C4-5 disc bulging and facet hypertrophy with moderate left foraminal stenosis.  Update 05/13/23 Dr. Buck: [name] is a 73 year old right-handed gentleman with an underlying medical history of arthritis, carcinoid tumor of the right lung, eczema, recurrent headaches, reflux disease, hyperlipidemia, osteoarthritis, and mild obesity, who presents for follow-up consultation of his obstructive sleep apnea, on AutoPap therapy.  The patient is unaccompanied today and presents for his 1 year checkup.  I first met him at the request of Dr. Onita on 03/05/2022, at which time he reported a prior diagnosis of obstructive sleep apnea.  He was on AutoPap therapy and compliant with treatment with good apnea control and good tolerance.  He had received a new PAP machine in May 2020.  He was advised to follow-up routinely in 1 year.    05/13/2023: I reviewed his AutoPap compliance data from 04/09/2023 through 05/08/2023, which is a total of 30 days, during which time he used his machine every night with percent use days greater than 4 hours at 100%, indicating superb compliance with an average usage of 9 hours and 53 minutes, residual AHI at goal at 0.1/h, 95th percentile of pressure at 12 cm with a range of 9 to 20 cm with EPR of 2.  Leak on the lower side with the 95th percentile at 4.4 L/min.  He reports doing well, no new concerns, up-to-date with his supplies through [employer].  He changes his filter regularly and uses the nasal pillows interface without trouble, uses  the humidifier, with distilled water.  He has no new concerns today.  Had a physical with Dr. Shayne about 3 weeks ago.  No new medications.  His headaches have essentially resolved thankfully.  His Epworth sleepiness score is 2 out of 24 today.  He saw Dr. Onita about 6 months ago and was doing well at the time.  I reviewed her office visit note from 10/30/2022.  Update 05/12/24 SS: CPAP report 100% compliance, 9-20 cm water, AHI 0.2, leak 16.4. Headaches have resolved.  Excellent CPAP benefit.  Without CPAP he snores terribly.  Interested in a travel machine.  His [place] of [place].  His health has been good, no new issues.  Very pleased with CPAP.  Uses nasal pillow mask.  Has not had sleep study testing in several years.  Was issued a new machine in May 2020.  REVIEW OF SYSTEMS: Out of a complete 14 system review of symptoms, the patient complains only of the following symptoms, and all other reviewed systems are negative.  See HPI  ALLERGIES: Allergies  Allergen Reactions   Codeine Other (See Comments)    headache   Other     Other reaction(s): unable to sleep- Steroids    HOME MEDICATIONS: Outpatient Medications Prior  to Visit  Medication Sig Dispense Refill   ibuprofen (ADVIL) 200 MG tablet Take 200 mg by mouth every 8 (eight) hours as needed. Takes 2- 3 tabs     Milk Thistle 1000 MG CAPS Take 1,000 mg by mouth daily.      rosuvastatin  (CRESTOR ) 10 MG tablet Take 10 mg by mouth daily in the afternoon.      ZEPBOUND 5 MG/0.5ML Pen inject 5mg  into the skin once a week     triamcinolone  cream (KENALOG ) 0.1 % Apply 1 application topically daily. (Patient not taking: Reported on 05/12/2024) 454 g 1   No facility-administered medications prior to visit.    PAST MEDICAL HISTORY: Past Medical History:  Diagnosis Date   Arthritis    knee, no meds   Cancer (HCC) 2020   carcinoid tumor of right lung   Carcinoid tumor of lung (HCC) 01/20/2019   Eczema 08/08/2010   Family history  of adverse reaction to anesthesia    mom- post op N/V   Hyperlipidemia    PONV (postoperative nausea and vomiting)    Sleep apnea    uses CPAP nightly   Unilateral primary osteoarthritis, right knee 07/30/2019    PAST SURGICAL HISTORY: Past Surgical History:  Procedure Laterality Date   COLONOSCOPY     hx polyps   EYE SURGERY     lasik   INTERCOSTAL NERVE BLOCK  01/20/2019   Procedure: Intercostal Nerve Block;  Surgeon: Army Dallas NOVAK, MD;  Location: [facility] OR;  Service: Thoracic;;   KNEE ARTHROSCOPY Right    SEPTOPLASTY  1980   x 2   TONSILLECTOMY AND ADENOIDECTOMY  1960   TOTAL KNEE ARTHROPLASTY Right 07/30/2019   TOTAL KNEE ARTHROPLASTY Right 07/30/2019   Procedure: RIGHT TOTAL KNEE ARTHROPLASTY;  Surgeon: Vernetta Lonni GRADE, MD;  Location: [facility] OR;  Service: Orthopedics;  Laterality: Right;   VIDEO ASSISTED THORACOSCOPY (VATS)/WEDGE RESECTION Right 01/20/2019   Procedure: VIDEO ASSISTED THORACOSCOPY (VATS) WITH RESECTION OF RIGHT INTRAPULMONARY LYMPH NODE;  Surgeon: Army Dallas NOVAK, MD;  Location: [facility] OR;  Service: Thoracic;  Laterality: Right;   VIDEO BRONCHOSCOPY N/A 01/20/2019   Procedure: VIDEO BRONCHOSCOPY;  Surgeon: Army Dallas NOVAK, MD;  Location: [facility] OR;  Service: Thoracic;  Laterality: N/A;    FAMILY HISTORY: Family History  Problem Relation Age of Onset   Heart disease Mother        CABG age 63s   Stomach cancer Neg Hx    Sleep apnea Neg Hx    Colon cancer Neg Hx    Colon polyps Neg Hx    Esophageal cancer Neg Hx    Rectal cancer Neg Hx     SOCIAL HISTORY: Social History   Socioeconomic History   Marital status: Married    Spouse name: Not on file   Number of children: 2   Years of education: Not on file   Highest education level: Not on file  Occupational History   Not on file  Tobacco Use   Smoking status: Never   Smokeless tobacco: Never  Vaping Use   Vaping status: Never Used  Substance and Sexual Activity   Alcohol use: Yes     Alcohol/week: 12.0 standard drinks of alcohol    Types: 12 Glasses of wine per week    Comment: social   Drug use: No   Sexual activity: Yes  Other Topics Concern   Not on file  Social History Narrative   Married, lives with wife.    Right Handed  Drinks hot tea   Social Drivers of Corporate Investment Banker Strain: Not on file  Food Insecurity: Food Insecurity Present (01/06/2024)   Hunger Vital Sign    Worried About Running Out of Food in the Last Year: Never true    Ran Out of Food in the Last Year: Often true  Transportation Needs: No Transportation Needs (01/06/2024)   PRAPARE - Administrator, Civil Service (Medical): No    Lack of Transportation (Non-Medical): No  Physical Activity: Not on file  Stress: Not on file  Social Connections: Not on file  Intimate Partner Violence: Not At Risk (01/06/2024)   Humiliation, Afraid, Rape, and Kick questionnaire    Fear of Current or Ex-Partner: No    Emotionally Abused: No    Physically Abused: No    Sexually Abused: No   PHYSICAL EXAM  Vitals:   05/12/24 0801  BP: (!) 153/90  Pulse: 96  Weight: 216 lb 9.6 oz (98.2 kg)  Height: 5' 10 (1.778 m)   Body mass index is 31.08 kg/m.  Generalized: Well developed, in no acute distress  Neurological examination  Mentation: Alert oriented to time, place, history taking. Follows all commands speech and language fluent Cranial nerve II-XII: Pupils were equal round reactive to light.  Motor: Moves all extremities independent Gait and station: Gait is normal. DIAGNOSTIC DATA (LABS, IMAGING, TESTING) - I reviewed patient records, labs, notes, testing and imaging myself where available.  Lab Results  Component Value Date   WBC 4.7 01/06/2024   HGB 13.2 01/06/2024   HCT 39.1 01/06/2024   MCV 91.8 01/06/2024   PLT 203 01/06/2024      Component Value Date/Time   NA 140 01/06/2024 1340   K 4.0 01/06/2024 1340   CL 104 01/06/2024 1340   CO2 31 01/06/2024 1340    GLUCOSE 103 (H) 01/06/2024 1340   BUN 16 01/06/2024 1340   CREATININE 1.26 (H) 01/06/2024 1340   CALCIUM  9.3 01/06/2024 1340   PROT 6.2 (L) 01/06/2024 1340   ALBUMIN 4.0 01/06/2024 1340   AST 17 01/06/2024 1340   ALT 17 01/06/2024 1340   ALKPHOS 60 01/06/2024 1340   BILITOT 0.8 01/06/2024 1340   GFRNONAA >60 01/06/2024 1340   GFRAA >60 07/31/2019 0315   No results found for: CHOL, HDL, LDLCALC, LDLDIRECT, TRIG, CHOLHDL No results found for: YHAJ8R No results found for: VITAMINB12 No results found for: TSH  Lauraine Born, AGNP-C, DNP 05/12/2024, 8:19 AM [facility] [address], [address] [place], [place] [address] [phone]

## 2024-05-12 NOTE — Patient Instructions (Signed)
 Great to see you today! Order home sleep study to qualify for new CPAP machine Continue nightly use minimum 4 hours Follow-up within 30 to 90 days of starting new CPAP. Thanks!!
